# Patient Record
Sex: Female | Born: 1935 | Race: White | Hispanic: No | Marital: Married | Smoking: Never smoker
Health system: Southern US, Community
[De-identification: ages and names within clinical notes are randomized; demographics above are authoritative.]

## PROBLEM LIST (undated history)

## (undated) DIAGNOSIS — R55 Syncope and collapse: Secondary | ICD-10-CM

## (undated) DIAGNOSIS — N179 Acute kidney failure, unspecified: Secondary | ICD-10-CM

## (undated) DIAGNOSIS — M545 Low back pain: Secondary | ICD-10-CM

## (undated) DIAGNOSIS — R300 Dysuria: Secondary | ICD-10-CM

## (undated) DIAGNOSIS — K859 Acute pancreatitis without necrosis or infection, unspecified: Secondary | ICD-10-CM

## (undated) DIAGNOSIS — M79609 Pain in unspecified limb: Secondary | ICD-10-CM

## (undated) DIAGNOSIS — I10 Essential (primary) hypertension: Secondary | ICD-10-CM

## (undated) DIAGNOSIS — I951 Orthostatic hypotension: Secondary | ICD-10-CM

## (undated) DIAGNOSIS — Z87442 Personal history of urinary calculi: Secondary | ICD-10-CM

## (undated) DIAGNOSIS — E785 Hyperlipidemia, unspecified: Secondary | ICD-10-CM

## (undated) DIAGNOSIS — M48061 Spinal stenosis, lumbar region without neurogenic claudication: Secondary | ICD-10-CM

## (undated) DIAGNOSIS — C189 Malignant neoplasm of colon, unspecified: Secondary | ICD-10-CM

## (undated) DIAGNOSIS — N209 Urinary calculus, unspecified: Secondary | ICD-10-CM

## (undated) HISTORY — DX: Malignant neoplasm of colon, unspecified: C18.9

## (undated) HISTORY — DX: Syncope and collapse: R55

## (undated) HISTORY — DX: Low back pain: M54.5

## (undated) HISTORY — PX: CYSTOSCOPY: SUR368

## (undated) HISTORY — DX: Acute pancreatitis without necrosis or infection, unspecified: K85.90

## (undated) HISTORY — DX: Hyperlipidemia, unspecified: E78.5

## (undated) HISTORY — DX: Pain in unspecified limb: M79.609

## (undated) HISTORY — DX: Spinal stenosis, lumbar region without neurogenic claudication: M48.061

## (undated) HISTORY — DX: Urinary calculus, unspecified: N20.9

## (undated) HISTORY — DX: Orthostatic hypotension: I95.1

## (undated) HISTORY — DX: Dysuria: R30.0

## (undated) HISTORY — DX: Acute kidney failure, unspecified: N17.9

## (undated) HISTORY — DX: Personal history of urinary calculi: Z87.442

## (undated) HISTORY — DX: Essential (primary) hypertension: I10

---

## 1998-03-03 ENCOUNTER — Other Ambulatory Visit: Admission: RE | Admit: 1998-03-03 | Discharge: 1998-03-03 | Payer: Self-pay | Admitting: Gastroenterology

## 1998-03-23 ENCOUNTER — Encounter (HOSPITAL_BASED_OUTPATIENT_CLINIC_OR_DEPARTMENT_OTHER): Payer: Self-pay | Admitting: General Surgery

## 1998-03-27 ENCOUNTER — Inpatient Hospital Stay (HOSPITAL_COMMUNITY): Admission: RE | Admit: 1998-03-27 | Discharge: 1998-03-31 | Payer: Self-pay | Admitting: General Surgery

## 1998-04-18 ENCOUNTER — Ambulatory Visit (HOSPITAL_COMMUNITY): Admission: RE | Admit: 1998-04-18 | Discharge: 1998-04-18 | Payer: Self-pay | Admitting: Internal Medicine

## 1998-04-18 ENCOUNTER — Encounter: Payer: Self-pay | Admitting: Internal Medicine

## 1998-07-12 ENCOUNTER — Encounter: Payer: Self-pay | Admitting: Obstetrics & Gynecology

## 1998-07-12 ENCOUNTER — Ambulatory Visit (HOSPITAL_COMMUNITY): Admission: RE | Admit: 1998-07-12 | Discharge: 1998-07-12 | Payer: Self-pay | Admitting: Oral Surgery

## 1999-07-25 ENCOUNTER — Ambulatory Visit (HOSPITAL_COMMUNITY): Admission: RE | Admit: 1999-07-25 | Discharge: 1999-07-25 | Payer: Self-pay | Admitting: Internal Medicine

## 1999-07-25 ENCOUNTER — Encounter: Payer: Self-pay | Admitting: Internal Medicine

## 2000-04-15 HISTORY — PX: COLON SURGERY: SHX602

## 2000-07-28 ENCOUNTER — Encounter: Payer: Self-pay | Admitting: Internal Medicine

## 2000-07-28 ENCOUNTER — Ambulatory Visit (HOSPITAL_COMMUNITY): Admission: RE | Admit: 2000-07-28 | Discharge: 2000-07-28 | Payer: Self-pay | Admitting: Internal Medicine

## 2000-11-11 ENCOUNTER — Other Ambulatory Visit: Admission: RE | Admit: 2000-11-11 | Discharge: 2000-11-11 | Payer: Self-pay | Admitting: Obstetrics and Gynecology

## 2000-11-14 ENCOUNTER — Ambulatory Visit (HOSPITAL_COMMUNITY): Admission: RE | Admit: 2000-11-14 | Discharge: 2000-11-14 | Payer: Self-pay | Admitting: Obstetrics and Gynecology

## 2000-11-14 ENCOUNTER — Encounter: Payer: Self-pay | Admitting: Obstetrics and Gynecology

## 2001-02-16 ENCOUNTER — Encounter: Payer: Self-pay | Admitting: Obstetrics and Gynecology

## 2001-02-16 ENCOUNTER — Ambulatory Visit (HOSPITAL_COMMUNITY): Admission: RE | Admit: 2001-02-16 | Discharge: 2001-02-16 | Payer: Self-pay | Admitting: Obstetrics and Gynecology

## 2001-03-30 ENCOUNTER — Inpatient Hospital Stay (HOSPITAL_COMMUNITY): Admission: RE | Admit: 2001-03-30 | Discharge: 2001-04-02 | Payer: Self-pay | Admitting: Obstetrics and Gynecology

## 2001-03-30 ENCOUNTER — Encounter (INDEPENDENT_AMBULATORY_CARE_PROVIDER_SITE_OTHER): Payer: Self-pay

## 2001-04-12 ENCOUNTER — Encounter: Payer: Self-pay | Admitting: Obstetrics and Gynecology

## 2001-04-12 ENCOUNTER — Inpatient Hospital Stay (HOSPITAL_COMMUNITY): Admission: EM | Admit: 2001-04-12 | Discharge: 2001-05-05 | Payer: Self-pay | Admitting: Emergency Medicine

## 2001-04-12 ENCOUNTER — Encounter: Payer: Self-pay | Admitting: Surgery

## 2001-04-17 ENCOUNTER — Encounter: Payer: Self-pay | Admitting: Surgery

## 2001-04-18 ENCOUNTER — Encounter: Payer: Self-pay | Admitting: Surgery

## 2001-04-19 ENCOUNTER — Encounter: Payer: Self-pay | Admitting: Surgery

## 2001-04-20 ENCOUNTER — Encounter: Payer: Self-pay | Admitting: Surgery

## 2001-04-21 ENCOUNTER — Encounter: Payer: Self-pay | Admitting: Surgery

## 2001-04-22 ENCOUNTER — Encounter: Payer: Self-pay | Admitting: Surgery

## 2001-04-24 ENCOUNTER — Encounter: Payer: Self-pay | Admitting: Surgery

## 2001-04-27 ENCOUNTER — Encounter: Payer: Self-pay | Admitting: Surgery

## 2001-05-03 ENCOUNTER — Encounter: Payer: Self-pay | Admitting: General Surgery

## 2002-02-19 ENCOUNTER — Encounter: Payer: Self-pay | Admitting: Emergency Medicine

## 2002-02-19 ENCOUNTER — Emergency Department (HOSPITAL_COMMUNITY): Admission: EM | Admit: 2002-02-19 | Discharge: 2002-02-19 | Payer: Self-pay | Admitting: Emergency Medicine

## 2003-01-06 ENCOUNTER — Encounter: Payer: Self-pay | Admitting: Internal Medicine

## 2003-01-06 ENCOUNTER — Ambulatory Visit (HOSPITAL_COMMUNITY): Admission: RE | Admit: 2003-01-06 | Discharge: 2003-01-06 | Payer: Self-pay | Admitting: *Deleted

## 2003-07-30 ENCOUNTER — Emergency Department (HOSPITAL_COMMUNITY): Admission: EM | Admit: 2003-07-30 | Discharge: 2003-07-30 | Payer: Self-pay | Admitting: Emergency Medicine

## 2004-02-24 ENCOUNTER — Ambulatory Visit (HOSPITAL_COMMUNITY): Admission: RE | Admit: 2004-02-24 | Discharge: 2004-02-24 | Payer: Self-pay | Admitting: Internal Medicine

## 2004-04-08 ENCOUNTER — Emergency Department (HOSPITAL_COMMUNITY): Admission: EM | Admit: 2004-04-08 | Discharge: 2004-04-08 | Payer: Self-pay | Admitting: Emergency Medicine

## 2004-05-08 ENCOUNTER — Ambulatory Visit: Payer: Self-pay | Admitting: Internal Medicine

## 2004-05-23 ENCOUNTER — Ambulatory Visit: Payer: Self-pay | Admitting: Family Medicine

## 2004-11-13 ENCOUNTER — Ambulatory Visit: Payer: Self-pay | Admitting: Internal Medicine

## 2004-11-20 ENCOUNTER — Ambulatory Visit: Payer: Self-pay | Admitting: Internal Medicine

## 2005-01-18 ENCOUNTER — Ambulatory Visit: Payer: Self-pay | Admitting: Internal Medicine

## 2005-02-27 ENCOUNTER — Ambulatory Visit (HOSPITAL_COMMUNITY): Admission: RE | Admit: 2005-02-27 | Discharge: 2005-02-27 | Payer: Self-pay | Admitting: Geriatric Medicine

## 2005-10-15 ENCOUNTER — Ambulatory Visit: Payer: Self-pay | Admitting: Internal Medicine

## 2005-11-01 ENCOUNTER — Ambulatory Visit: Payer: Self-pay | Admitting: Internal Medicine

## 2006-01-03 ENCOUNTER — Ambulatory Visit: Payer: Self-pay | Admitting: Internal Medicine

## 2006-02-27 ENCOUNTER — Ambulatory Visit: Payer: Self-pay | Admitting: Internal Medicine

## 2006-03-11 ENCOUNTER — Ambulatory Visit (HOSPITAL_COMMUNITY): Admission: RE | Admit: 2006-03-11 | Discharge: 2006-03-11 | Payer: Self-pay | Admitting: Internal Medicine

## 2006-11-26 ENCOUNTER — Inpatient Hospital Stay (HOSPITAL_COMMUNITY): Admission: EM | Admit: 2006-11-26 | Discharge: 2006-11-29 | Payer: Self-pay | Admitting: Urology

## 2006-11-26 ENCOUNTER — Ambulatory Visit: Payer: Self-pay | Admitting: Internal Medicine

## 2006-11-26 ENCOUNTER — Encounter: Payer: Self-pay | Admitting: Emergency Medicine

## 2006-11-27 ENCOUNTER — Encounter (INDEPENDENT_AMBULATORY_CARE_PROVIDER_SITE_OTHER): Payer: Self-pay | Admitting: *Deleted

## 2006-12-09 ENCOUNTER — Ambulatory Visit (HOSPITAL_BASED_OUTPATIENT_CLINIC_OR_DEPARTMENT_OTHER): Admission: RE | Admit: 2006-12-09 | Discharge: 2006-12-10 | Payer: Self-pay | Admitting: Urology

## 2007-02-03 DIAGNOSIS — M48061 Spinal stenosis, lumbar region without neurogenic claudication: Secondary | ICD-10-CM

## 2007-02-03 DIAGNOSIS — C189 Malignant neoplasm of colon, unspecified: Secondary | ICD-10-CM

## 2007-02-03 DIAGNOSIS — I10 Essential (primary) hypertension: Secondary | ICD-10-CM

## 2007-02-03 HISTORY — DX: Malignant neoplasm of colon, unspecified: C18.9

## 2007-02-03 HISTORY — DX: Essential (primary) hypertension: I10

## 2007-02-03 HISTORY — DX: Spinal stenosis, lumbar region without neurogenic claudication: M48.061

## 2007-02-04 ENCOUNTER — Ambulatory Visit: Payer: Self-pay | Admitting: Internal Medicine

## 2007-02-04 DIAGNOSIS — N209 Urinary calculus, unspecified: Secondary | ICD-10-CM

## 2007-02-04 DIAGNOSIS — R300 Dysuria: Secondary | ICD-10-CM

## 2007-02-04 HISTORY — DX: Dysuria: R30.0

## 2007-02-04 HISTORY — DX: Urinary calculus, unspecified: N20.9

## 2007-02-04 LAB — CONVERTED CEMR LAB
Bilirubin Urine: NEGATIVE
Glucose, Urine, Semiquant: NEGATIVE
Ketones, urine, test strip: NEGATIVE
Nitrite: NEGATIVE
Protein, U semiquant: 30
Specific Gravity, Urine: >=1.03
Urobilinogen, UA: NEGATIVE
pH: 5

## 2007-02-05 ENCOUNTER — Encounter: Payer: Self-pay | Admitting: Internal Medicine

## 2007-02-09 ENCOUNTER — Encounter (INDEPENDENT_AMBULATORY_CARE_PROVIDER_SITE_OTHER): Payer: Self-pay | Admitting: *Deleted

## 2007-02-10 ENCOUNTER — Encounter (INDEPENDENT_AMBULATORY_CARE_PROVIDER_SITE_OTHER): Payer: Self-pay | Admitting: *Deleted

## 2007-03-13 ENCOUNTER — Ambulatory Visit: Payer: Self-pay | Admitting: Internal Medicine

## 2007-03-13 DIAGNOSIS — R55 Syncope and collapse: Secondary | ICD-10-CM | POA: Insufficient documentation

## 2007-03-13 DIAGNOSIS — M545 Low back pain, unspecified: Secondary | ICD-10-CM | POA: Insufficient documentation

## 2007-03-13 HISTORY — DX: Low back pain, unspecified: M54.50

## 2007-03-13 HISTORY — DX: Syncope and collapse: R55

## 2007-03-13 LAB — CONVERTED CEMR LAB
Bilirubin Urine: NEGATIVE
Blood in Urine, dipstick: NEGATIVE
Glucose, Urine, Semiquant: NEGATIVE
Ketones, urine, test strip: NEGATIVE
Nitrite: NEGATIVE
Protein, U semiquant: NEGATIVE
Specific Gravity, Urine: 1.02
Urobilinogen, UA: NEGATIVE
WBC Urine, dipstick: NEGATIVE
pH: 5

## 2007-03-16 ENCOUNTER — Ambulatory Visit: Payer: Self-pay | Admitting: Internal Medicine

## 2007-03-18 ENCOUNTER — Telehealth: Payer: Self-pay | Admitting: Internal Medicine

## 2007-04-24 ENCOUNTER — Ambulatory Visit (HOSPITAL_COMMUNITY): Admission: RE | Admit: 2007-04-24 | Discharge: 2007-04-24 | Payer: Self-pay | Admitting: Internal Medicine

## 2007-06-19 ENCOUNTER — Ambulatory Visit: Payer: Self-pay | Admitting: Internal Medicine

## 2007-06-19 DIAGNOSIS — Z87442 Personal history of urinary calculi: Secondary | ICD-10-CM

## 2007-06-19 DIAGNOSIS — E785 Hyperlipidemia, unspecified: Secondary | ICD-10-CM

## 2007-06-19 HISTORY — DX: Hyperlipidemia, unspecified: E78.5

## 2007-06-19 HISTORY — DX: Personal history of urinary calculi: Z87.442

## 2007-09-18 ENCOUNTER — Ambulatory Visit: Payer: Self-pay | Admitting: Internal Medicine

## 2007-09-18 LAB — CONVERTED CEMR LAB
ALT: 20 units/L (ref 0–35)
AST: 23 units/L (ref 0–37)
Albumin: 3.7 g/dL (ref 3.5–5.2)
Alkaline Phosphatase: 51 units/L (ref 39–117)
BUN: 11 mg/dL (ref 6–23)
Basophils Absolute: 0 10*3/uL (ref 0.0–0.1)
Basophils Relative: 0.7 % (ref 0.0–1.0)
Bilirubin Urine: NEGATIVE
Bilirubin, Direct: 0.1 mg/dL (ref 0.0–0.3)
CO2: 32 meq/L (ref 19–32)
Calcium: 10.3 mg/dL (ref 8.4–10.5)
Chloride: 105 meq/L (ref 96–112)
Cholesterol: 169 mg/dL (ref 0–200)
Creatinine, Ser: 0.9 mg/dL (ref 0.4–1.2)
Direct LDL: 90.2 mg/dL
Eosinophils Absolute: 0.1 10*3/uL (ref 0.0–0.7)
Eosinophils Relative: 1.8 % (ref 0.0–5.0)
GFR calc Af Amer: 77 mL/min
GFR calc non Af Amer: 63 mL/min
Glucose, Bld: 98 mg/dL (ref 70–99)
Glucose, Urine, Semiquant: NEGATIVE
HCT: 38.7 % (ref 36.0–46.0)
HDL: 52 mg/dL (ref >39.0)
Hemoglobin: 13.4 g/dL (ref 12.0–15.0)
Ketones, urine, test strip: NEGATIVE
Lymphocytes Relative: 46.5 % — ABNORMAL HIGH (ref 12.0–46.0)
MCHC: 34.6 g/dL (ref 30.0–36.0)
MCV: 93.6 fL (ref 78.0–100.0)
Monocytes Absolute: 0.5 10*3/uL (ref 0.1–1.0)
Monocytes Relative: 9 % (ref 3.0–12.0)
Neutro Abs: 2.3 10*3/uL (ref 1.4–7.7)
Neutrophils Relative %: 42 % — ABNORMAL LOW (ref 43.0–77.0)
Nitrite: POSITIVE
Platelets: 184 10*3/uL (ref 150–400)
Potassium: 4.5 meq/L (ref 3.5–5.1)
Protein, U semiquant: 100
RBC: 4.14 M/uL (ref 3.87–5.11)
RDW: 11.7 % (ref 11.5–14.6)
Sodium: 144 meq/L (ref 135–145)
Specific Gravity, Urine: >=1.03
TSH: 1.54 microintl units/mL (ref 0.35–5.50)
Total Bilirubin: 1.1 mg/dL (ref 0.3–1.2)
Total CHOL/HDL Ratio: 3.3
Total Protein: 6.7 g/dL (ref 6.0–8.3)
Triglycerides: 208 mg/dL (ref 0–149)
Urobilinogen, UA: 0.2
VLDL: 42 mg/dL — ABNORMAL HIGH (ref 0–40)
WBC: 5.5 10*3/uL (ref 4.5–10.5)
pH: 5

## 2007-10-19 ENCOUNTER — Telehealth: Payer: Self-pay | Admitting: Internal Medicine

## 2007-12-22 ENCOUNTER — Telehealth: Payer: Self-pay | Admitting: Internal Medicine

## 2007-12-24 ENCOUNTER — Ambulatory Visit: Payer: Self-pay | Admitting: Internal Medicine

## 2007-12-24 DIAGNOSIS — I951 Orthostatic hypotension: Secondary | ICD-10-CM

## 2007-12-24 HISTORY — DX: Orthostatic hypotension: I95.1

## 2007-12-27 ENCOUNTER — Encounter: Payer: Self-pay | Admitting: Internal Medicine

## 2007-12-27 ENCOUNTER — Ambulatory Visit: Payer: Self-pay | Admitting: Internal Medicine

## 2007-12-27 ENCOUNTER — Inpatient Hospital Stay (HOSPITAL_COMMUNITY): Admission: EM | Admit: 2007-12-27 | Discharge: 2008-01-03 | Payer: Self-pay | Admitting: Emergency Medicine

## 2007-12-27 DIAGNOSIS — R197 Diarrhea, unspecified: Secondary | ICD-10-CM | POA: Insufficient documentation

## 2007-12-27 DIAGNOSIS — K859 Acute pancreatitis without necrosis or infection, unspecified: Secondary | ICD-10-CM | POA: Insufficient documentation

## 2007-12-27 DIAGNOSIS — N179 Acute kidney failure, unspecified: Secondary | ICD-10-CM

## 2007-12-27 HISTORY — DX: Acute pancreatitis without necrosis or infection, unspecified: K85.90

## 2007-12-27 HISTORY — DX: Acute kidney failure, unspecified: N17.9

## 2007-12-28 ENCOUNTER — Ambulatory Visit: Payer: Self-pay | Admitting: Internal Medicine

## 2007-12-30 ENCOUNTER — Encounter: Payer: Self-pay | Admitting: Internal Medicine

## 2008-01-05 ENCOUNTER — Telehealth (INDEPENDENT_AMBULATORY_CARE_PROVIDER_SITE_OTHER): Payer: Self-pay | Admitting: *Deleted

## 2008-01-11 LAB — HM COLONOSCOPY

## 2008-01-12 ENCOUNTER — Ambulatory Visit: Payer: Self-pay | Admitting: Internal Medicine

## 2008-01-12 LAB — CONVERTED CEMR LAB
Bilirubin Urine: NEGATIVE
Glucose, Urine, Semiquant: NEGATIVE
Ketones, urine, test strip: NEGATIVE
Nitrite: NEGATIVE
Protein, U semiquant: 30
Specific Gravity, Urine: 1.015
Urobilinogen, UA: NEGATIVE
pH: 5

## 2008-03-17 ENCOUNTER — Emergency Department (HOSPITAL_COMMUNITY): Admission: EM | Admit: 2008-03-17 | Discharge: 2008-03-18 | Payer: Self-pay | Admitting: Emergency Medicine

## 2008-03-18 ENCOUNTER — Inpatient Hospital Stay (HOSPITAL_COMMUNITY): Admission: AD | Admit: 2008-03-18 | Discharge: 2008-03-21 | Payer: Self-pay | Admitting: Urology

## 2008-07-07 ENCOUNTER — Ambulatory Visit: Payer: Self-pay | Admitting: Internal Medicine

## 2008-09-13 ENCOUNTER — Ambulatory Visit: Payer: Self-pay | Admitting: Family Medicine

## 2008-10-13 ENCOUNTER — Ambulatory Visit: Payer: Self-pay | Admitting: Internal Medicine

## 2009-01-18 ENCOUNTER — Ambulatory Visit: Payer: Self-pay | Admitting: Internal Medicine

## 2009-04-18 ENCOUNTER — Ambulatory Visit: Payer: Self-pay | Admitting: Internal Medicine

## 2009-05-19 ENCOUNTER — Ambulatory Visit: Payer: Self-pay | Admitting: Internal Medicine

## 2009-05-19 LAB — CONVERTED CEMR LAB
BUN: 15 mg/dL (ref 6–23)
CO2: 27 meq/L (ref 19–32)
Calcium: 10.1 mg/dL (ref 8.4–10.5)
Chloride: 103 meq/L (ref 96–112)
Creatinine, Ser: 1.47 mg/dL — ABNORMAL HIGH (ref 0.40–1.20)
Glucose, Bld: 91 mg/dL (ref 70–99)
Potassium: 4.5 meq/L (ref 3.5–5.3)
Sodium: 142 meq/L (ref 135–145)

## 2009-06-07 ENCOUNTER — Emergency Department (HOSPITAL_COMMUNITY): Admission: EM | Admit: 2009-06-07 | Discharge: 2009-06-07 | Payer: Self-pay | Admitting: Emergency Medicine

## 2009-06-16 ENCOUNTER — Ambulatory Visit: Payer: Self-pay | Admitting: Internal Medicine

## 2009-08-30 IMAGING — CT CT PELVIS W/O CM
2 of 4 series · 17 of 46 positions shown, 19 images · non-contrast
Comparison: 11/26/2006

CT ABDOMEN

CLINICAL DATA: Tenderness around colostomy

CT ABDOMEN AND PELVIS WITHOUT CONTRAST
TECHNIQUE: Multidetector CT imaging of the abdomen and pelvis was
performed following the standard
protocol without intravenous contrast.

[Series 2: abd_ 5.0 b40f st · axial · 0.72mm/px · z∈[-458,-82]mm · 14 of 83 slices shown, 16 images]
[im 4/83  soft-tissue]
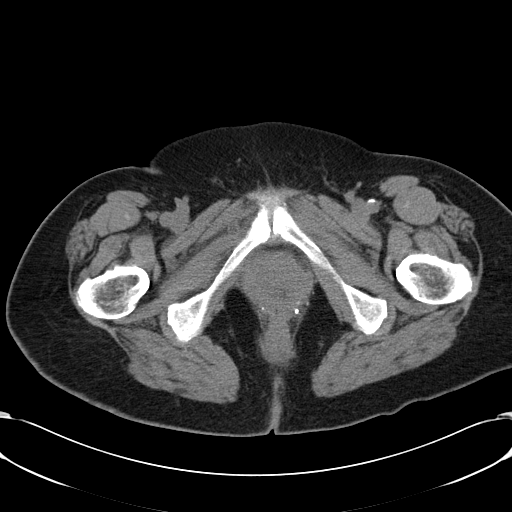
[im 4/83  bone]
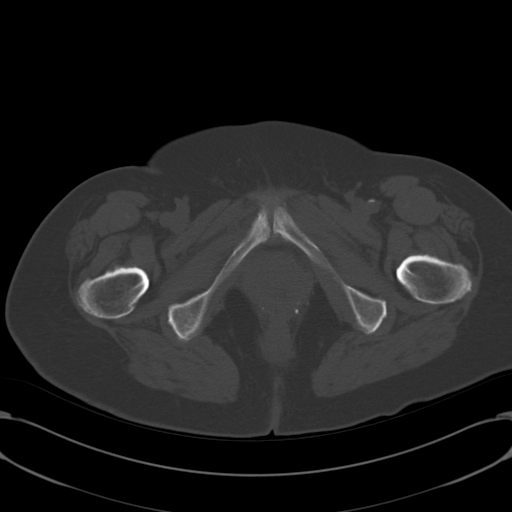
[im 10/83  soft-tissue]
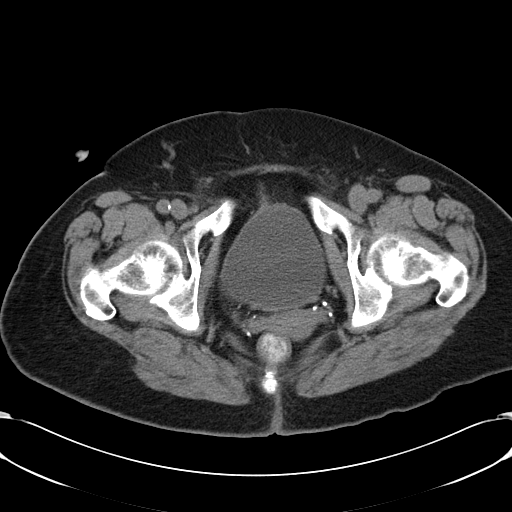
[im 17/83  soft-tissue]
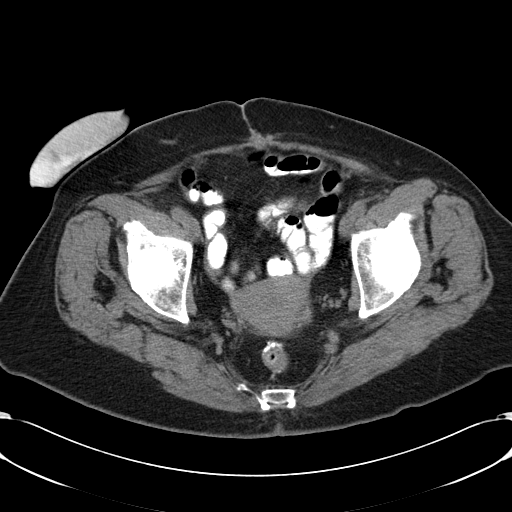
[im 23/83  soft-tissue]
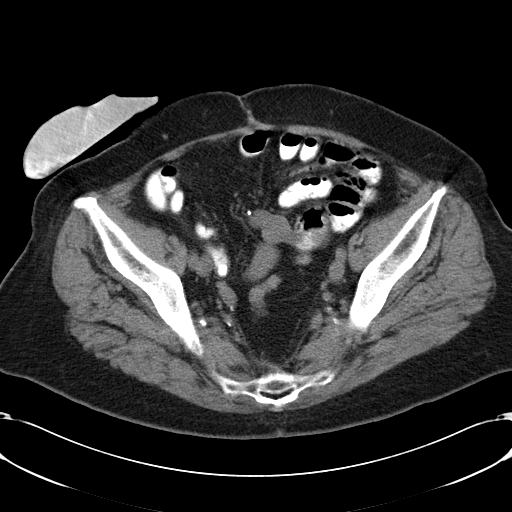
[im 27/83  soft-tissue]
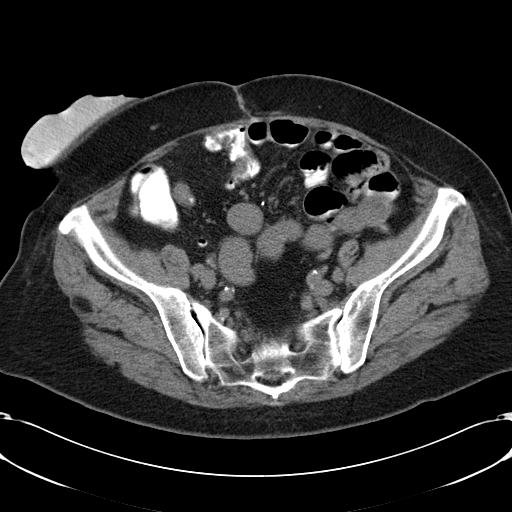
[im 33/83  soft-tissue]
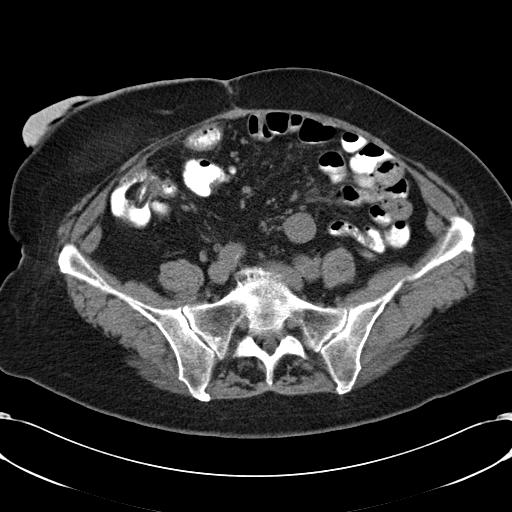
[im 40/83  soft-tissue]
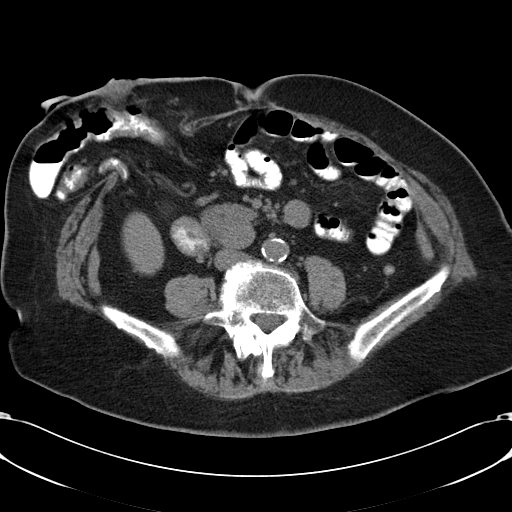
[im 43/83  soft-tissue]
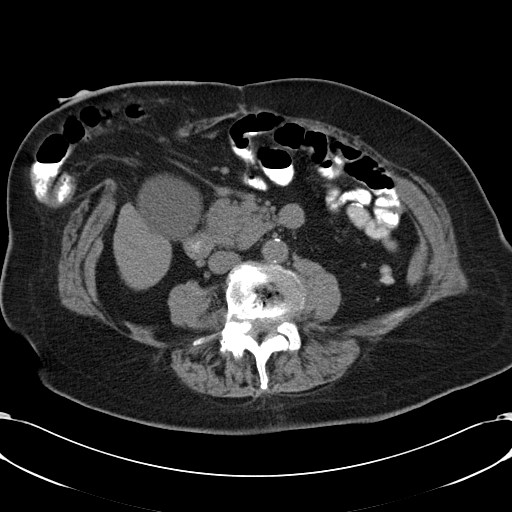
[im 50/83  soft-tissue]
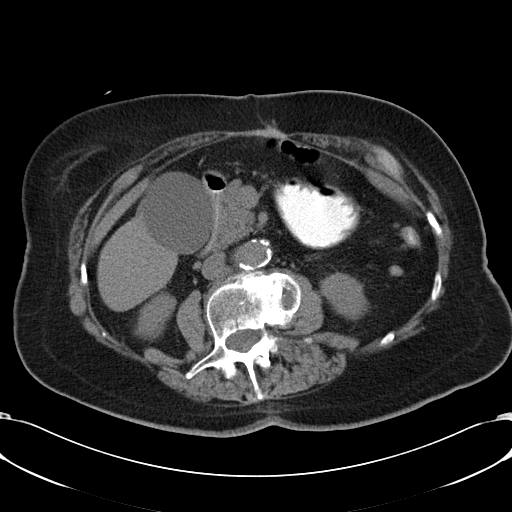
[im 50/83  bone]
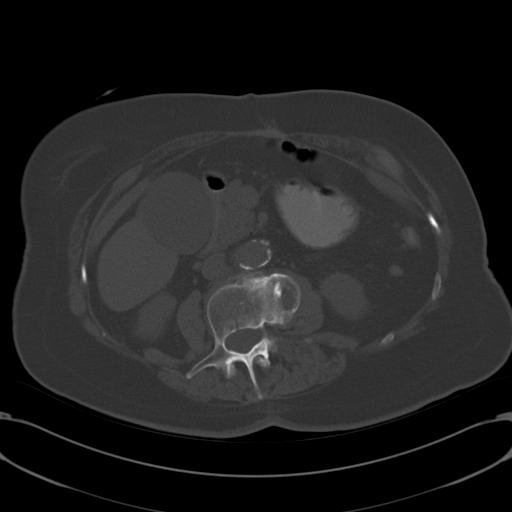
[im 56/83  soft-tissue]
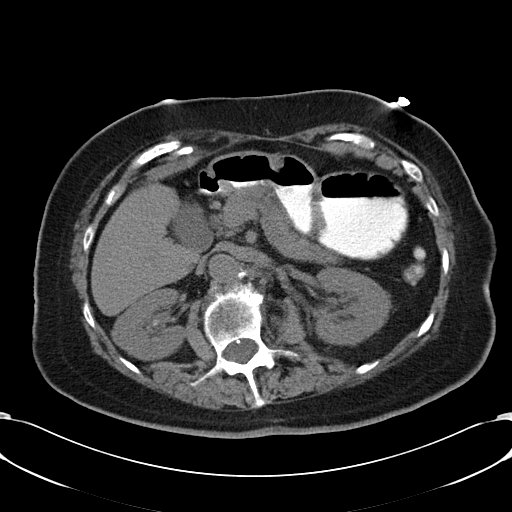
[im 63/83  soft-tissue]
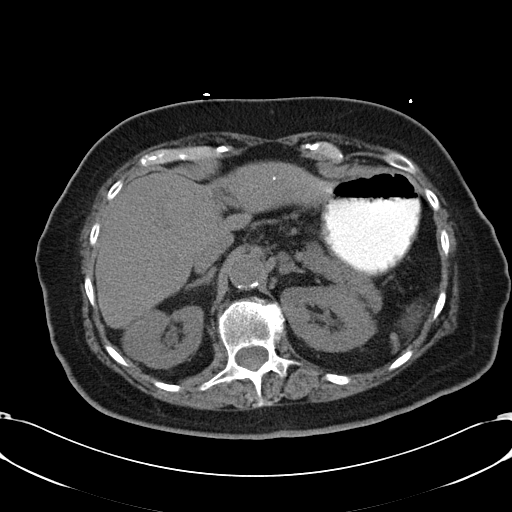
[im 66/83  soft-tissue]
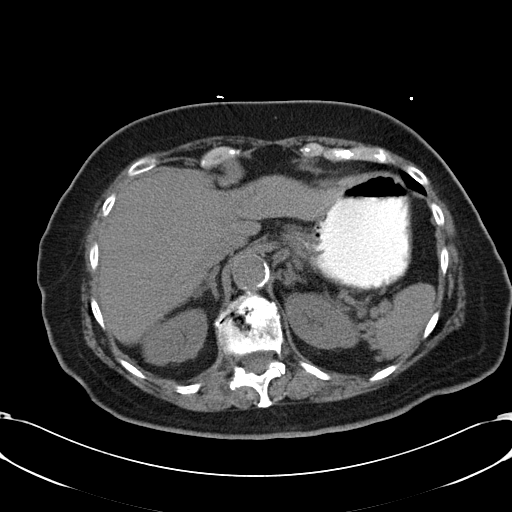
[im 73/83  soft-tissue]
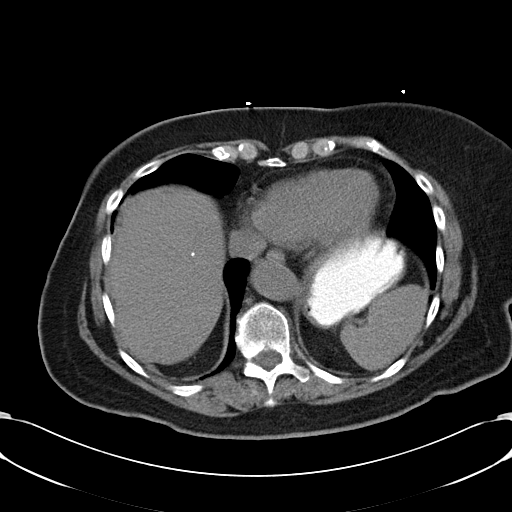
[im 79/83  soft-tissue]
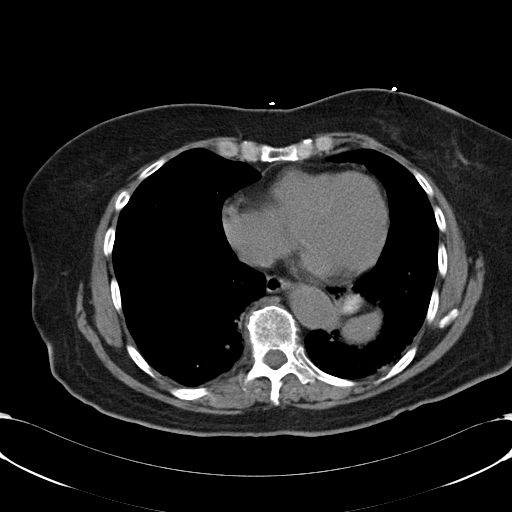

[Series 602: coronal abdomen · coronal · 0.84mm/px · 3 of 117 slices shown]
[im 39/117  soft-tissue]
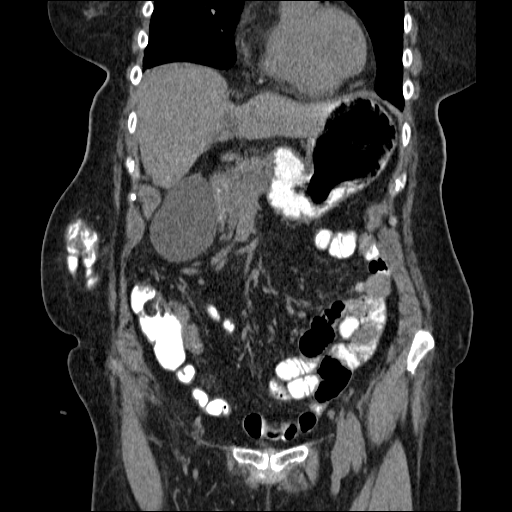
[im 52/117  soft-tissue]
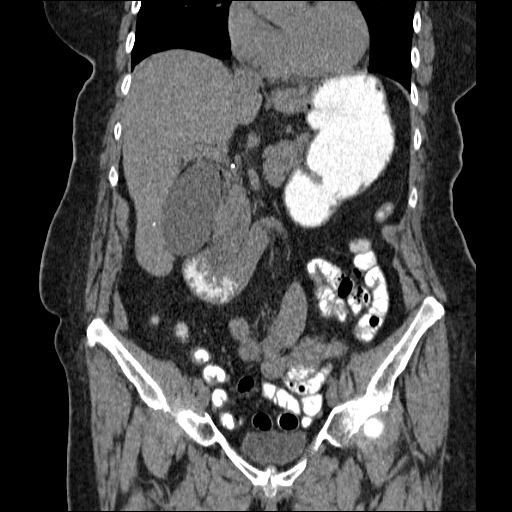
[im 65/117  soft-tissue]
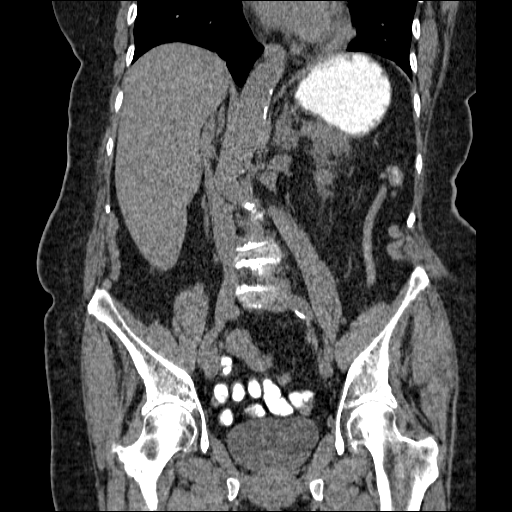

[17 of 46 positions shown; findings below may reference images not displayed]

FINDINGS: Atelectatic changes at the lung bases are noted.

Right nephrolithiasis is present.  No hydronephrosis or ureteral
calculi.  Gallstones are redemonstrated.  The liver contains a few
calcifications which is stable.  Splenic calcified granulomata are
noted.  The pancreas is unremarkable.  Negative free fluid.

The colostomy site is stable in appearance.  Transverse colon is
persistently herniated through abdominal wall defect at the hernia
site.  There is wall thickening and inflammatory change involving
the most distal aspect of the colon entering the colostomy site.
Slight inflammatory change is present in the adjacent subcutaneous
fat. No focal abscess.  No free intraperitoneal gas.  No
disproportionate dilatation of small bowel.
IMPRESSION: There is wall thickening of the distal most aspect of the colon
entering the colostomy site. Considerations would include
infectious etiology, inflammatory bowel disease, and ischemia.
Transverse colon is persistently herniated through and anterior
abdominal wall defect at the colostomy site.

Cholelithiasis and right nephrolithiasis.

CT PELVIS
FINDINGS: The uterus and adnexa are within normal limits.  The
bladder is within normal limits.  Negative free fluid.
IMPRESSION:

## 2009-08-30 IMAGING — CR DG CHEST 2V
2 series · 2 of 2 positions shown · non-contrast
Comparison: 04/08/2004

CLINICAL DATA: Weakness

CHEST - 2 VIEW

[w chest lat]
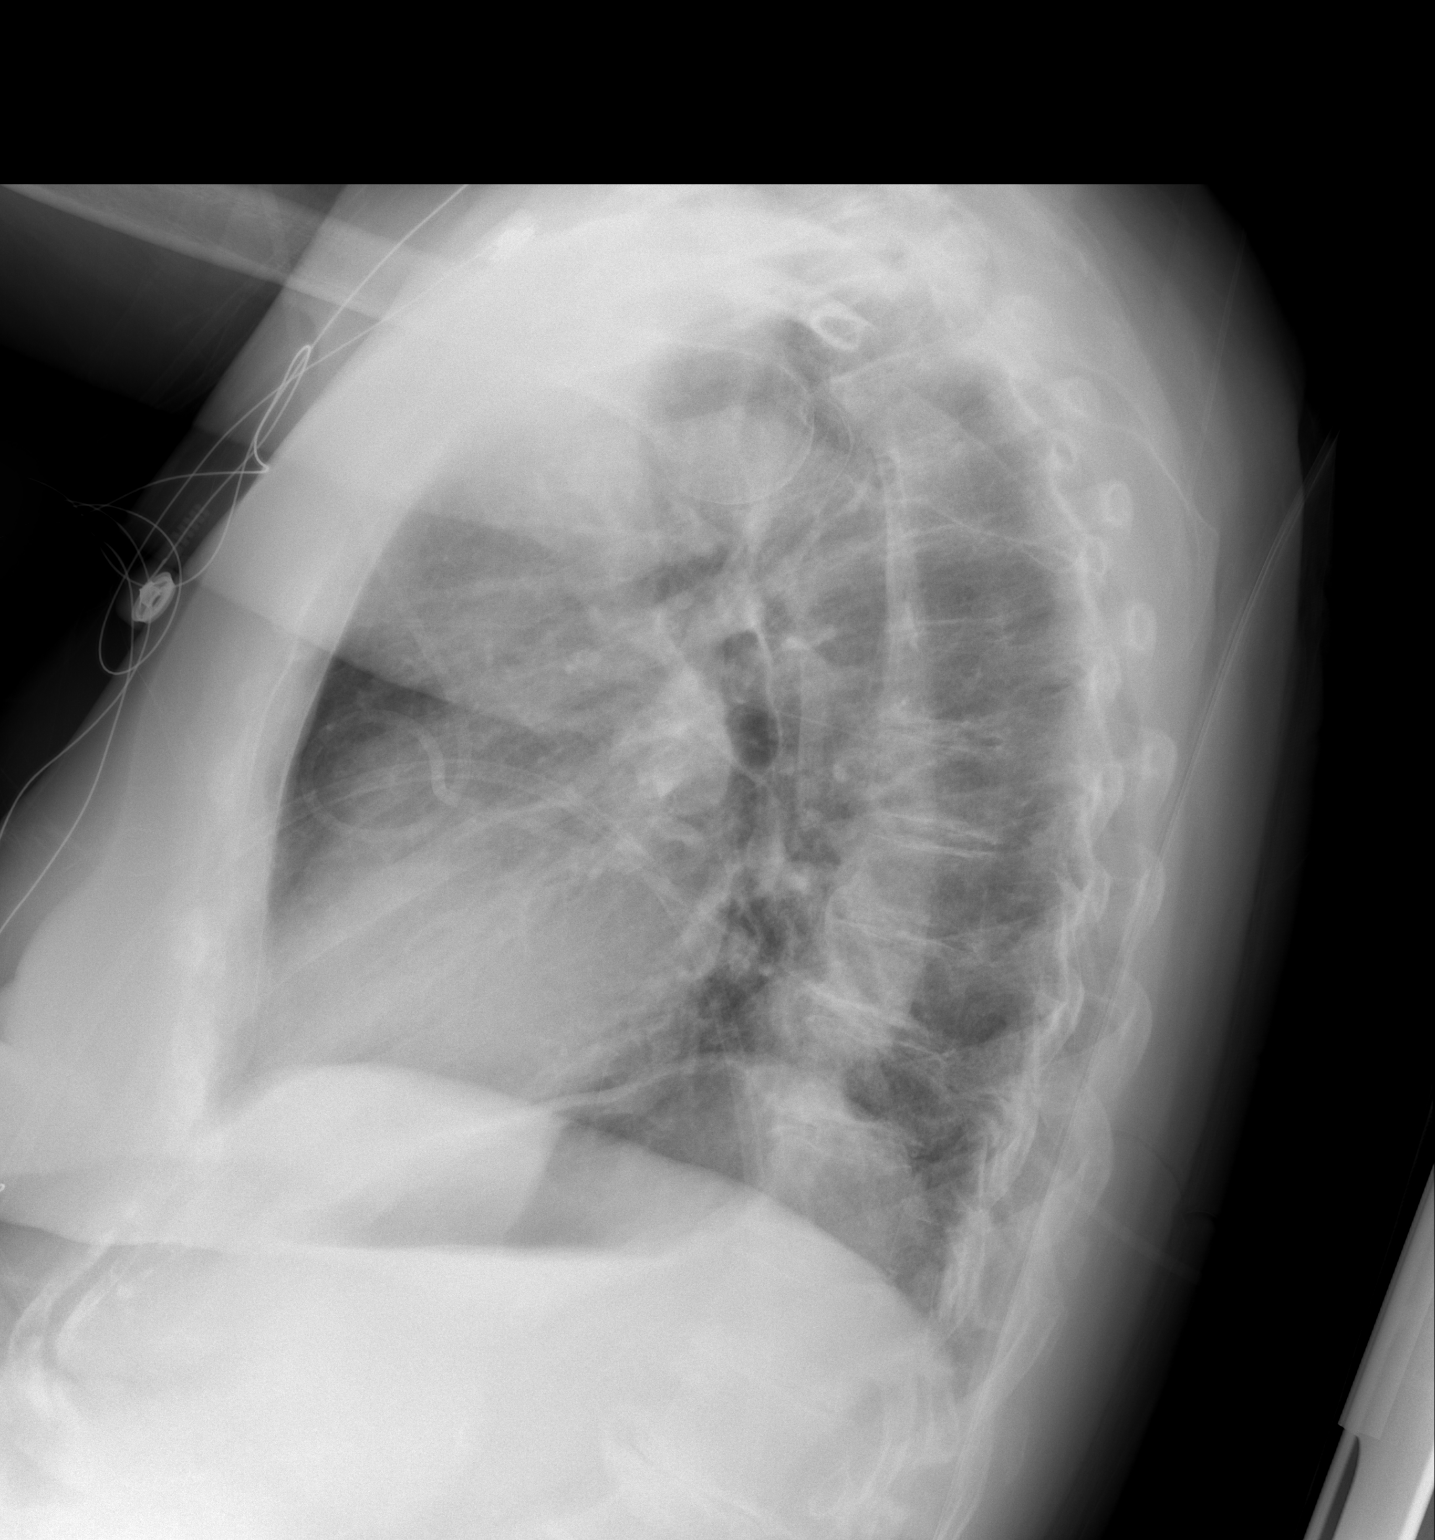

[view not recorded]
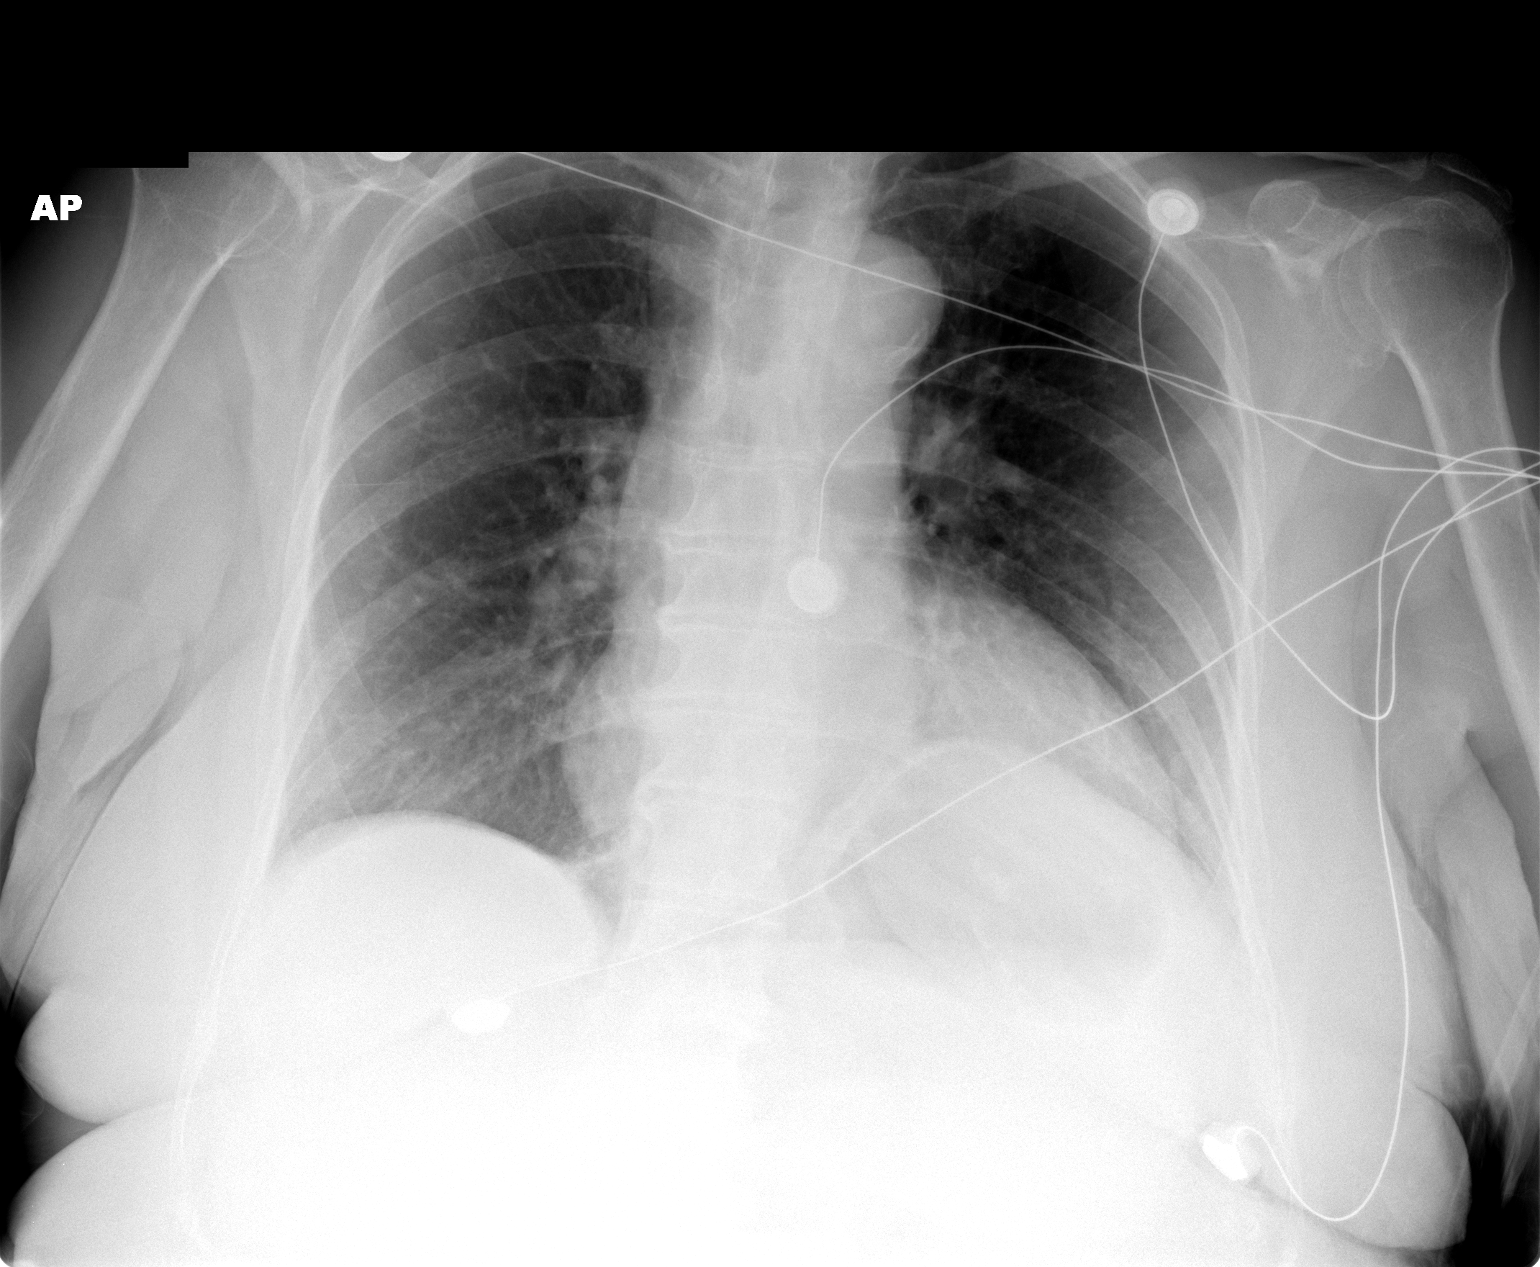

[2 of 2 positions shown; findings below may reference images not displayed]

FINDINGS: Cardiomediastinal silhouette is stable.  No acute
infiltrate or edema.  No pleural effusion.  Osteopenia and
degenerative changes thoracic spine are seen.
IMPRESSION: No active disease.  Osteopenia and degenerative changes thoracic
spine.

## 2009-09-22 ENCOUNTER — Ambulatory Visit: Payer: Self-pay | Admitting: Internal Medicine

## 2009-12-28 ENCOUNTER — Ambulatory Visit: Payer: Self-pay | Admitting: Internal Medicine

## 2010-04-20 ENCOUNTER — Other Ambulatory Visit: Payer: Self-pay | Admitting: Internal Medicine

## 2010-04-20 ENCOUNTER — Ambulatory Visit
Admission: RE | Admit: 2010-04-20 | Discharge: 2010-04-20 | Payer: Self-pay | Source: Home / Self Care | Attending: Internal Medicine | Admitting: Internal Medicine

## 2010-04-20 ENCOUNTER — Encounter: Payer: Self-pay | Admitting: Internal Medicine

## 2010-04-20 LAB — CBC WITH DIFFERENTIAL/PLATELET
Basophils Absolute: 0.1 10*3/uL (ref 0.0–0.1)
Basophils Relative: 0.9 % (ref 0.0–3.0)
Eosinophils Absolute: 0.2 10*3/uL (ref 0.0–0.7)
Eosinophils Relative: 3.2 % (ref 0.0–5.0)
HCT: 40.2 % (ref 36.0–46.0)
Hemoglobin: 13.3 g/dL (ref 12.0–15.0)
Lymphocytes Relative: 37.8 % (ref 12.0–46.0)
Lymphs Abs: 2.6 10*3/uL (ref 0.7–4.0)
MCHC: 33.2 g/dL (ref 30.0–36.0)
MCV: 90.9 fl (ref 78.0–100.0)
Monocytes Absolute: 0.5 10*3/uL (ref 0.1–1.0)
Monocytes Relative: 7.4 % (ref 3.0–12.0)
Neutro Abs: 3.5 10*3/uL (ref 1.4–7.7)
Neutrophils Relative %: 50.7 % (ref 43.0–77.0)
Platelets: 204 10*3/uL (ref 150.0–400.0)
RBC: 4.42 Mil/uL (ref 3.87–5.11)
RDW: 13.6 % (ref 11.5–14.6)
WBC: 6.8 10*3/uL (ref 4.5–10.5)

## 2010-04-20 LAB — LIPID PANEL
Cholesterol: 143 mg/dL (ref 0–200)
HDL: 50.3 mg/dL (ref >39.00)
LDL Cholesterol: 55 mg/dL (ref 0–99)
Total CHOL/HDL Ratio: 3
Triglycerides: 188 mg/dL — ABNORMAL HIGH (ref 0.0–149.0)
VLDL: 37.6 mg/dL (ref 0.0–40.0)

## 2010-04-20 LAB — HEPATIC FUNCTION PANEL
ALT: 20 U/L (ref 0–35)
AST: 22 U/L (ref 0–37)
Albumin: 3.6 g/dL (ref 3.5–5.2)
Alkaline Phosphatase: 61 U/L (ref 39–117)
Bilirubin, Direct: 0.1 mg/dL (ref 0.0–0.3)
Total Bilirubin: 0.7 mg/dL (ref 0.3–1.2)
Total Protein: 7 g/dL (ref 6.0–8.3)

## 2010-04-20 LAB — TSH: TSH: 2.35 u[IU]/mL (ref 0.35–5.50)

## 2010-04-20 LAB — BASIC METABOLIC PANEL
BUN: 16 mg/dL (ref 6–23)
CO2: 28 mEq/L (ref 19–32)
Calcium: 9.6 mg/dL (ref 8.4–10.5)
Chloride: 107 mEq/L (ref 96–112)
Creatinine, Ser: 1.5 mg/dL — ABNORMAL HIGH (ref 0.4–1.2)
GFR: 34.04 mL/min — ABNORMAL LOW (ref >60.00)
Glucose, Bld: 103 mg/dL — ABNORMAL HIGH (ref 70–99)
Potassium: 4.6 mEq/L (ref 3.5–5.1)
Sodium: 144 mEq/L (ref 135–145)

## 2010-05-06 ENCOUNTER — Encounter: Payer: Self-pay | Admitting: Family Medicine

## 2010-05-13 LAB — CONVERTED CEMR LAB
ALT: 18 units/L (ref 0–35)
AST: 24 units/L (ref 0–37)
Albumin: 3.5 g/dL (ref 3.5–5.2)
Alkaline Phosphatase: 49 units/L (ref 39–117)
BUN: 25 mg/dL — ABNORMAL HIGH (ref 6–23)
Basophils Absolute: 0 10*3/uL (ref 0.0–0.1)
Basophils Relative: 0.7 % (ref 0.0–3.0)
Bilirubin, Direct: 0 mg/dL (ref 0.0–0.3)
CO2: 28 meq/L (ref 19–32)
Calcium: 10.3 mg/dL (ref 8.4–10.5)
Chloride: 108 meq/L (ref 96–112)
Cholesterol: 169 mg/dL (ref 0–200)
Creatinine, Ser: 2 mg/dL — ABNORMAL HIGH (ref 0.4–1.2)
Direct LDL: 80.7 mg/dL
Eosinophils Absolute: 0.1 10*3/uL (ref 0.0–0.7)
Eosinophils Relative: 1.8 % (ref 0.0–5.0)
GFR calc non Af Amer: 25.04 mL/min (ref >60)
Glucose, Bld: 105 mg/dL — ABNORMAL HIGH (ref 70–99)
HCT: 36.6 % (ref 36.0–46.0)
HDL: 51.2 mg/dL (ref >39.00)
Hemoglobin: 12 g/dL (ref 12.0–15.0)
Lymphocytes Relative: 39.9 % (ref 12.0–46.0)
Lymphs Abs: 2.7 10*3/uL (ref 0.7–4.0)
MCHC: 32.7 g/dL (ref 30.0–36.0)
MCV: 95.3 fL (ref 78.0–100.0)
Monocytes Absolute: 0.5 10*3/uL (ref 0.1–1.0)
Monocytes Relative: 7.3 % (ref 3.0–12.0)
Neutro Abs: 3.5 10*3/uL (ref 1.4–7.7)
Neutrophils Relative %: 50.3 % (ref 43.0–77.0)
Platelets: 204 10*3/uL (ref 150.0–400.0)
Potassium: 4.5 meq/L (ref 3.5–5.1)
RBC: 3.84 M/uL — ABNORMAL LOW (ref 3.87–5.11)
RDW: 12.7 % (ref 11.5–14.6)
Sodium: 144 meq/L (ref 135–145)
TSH: 2 microintl units/mL (ref 0.35–5.50)
Total Bilirubin: 0.9 mg/dL (ref 0.3–1.2)
Total CHOL/HDL Ratio: 3
Total Protein: 7.2 g/dL (ref 6.0–8.3)
Triglycerides: 306 mg/dL — ABNORMAL HIGH (ref 0.0–149.0)
VLDL: 61.2 mg/dL — ABNORMAL HIGH (ref 0.0–40.0)
WBC: 6.8 10*3/uL (ref 4.5–10.5)

## 2010-05-14 ENCOUNTER — Ambulatory Visit
Admission: RE | Admit: 2010-05-14 | Discharge: 2010-05-14 | Payer: Self-pay | Source: Home / Self Care | Attending: Internal Medicine | Admitting: Internal Medicine

## 2010-05-14 ENCOUNTER — Encounter: Payer: Self-pay | Admitting: Internal Medicine

## 2010-05-14 ENCOUNTER — Ambulatory Visit: Admission: RE | Admit: 2010-05-14 | Discharge: 2010-05-14 | Payer: Self-pay | Source: Home / Self Care

## 2010-05-14 DIAGNOSIS — M79609 Pain in unspecified limb: Secondary | ICD-10-CM | POA: Insufficient documentation

## 2010-05-14 HISTORY — DX: Pain in unspecified limb: M79.609

## 2010-05-17 NOTE — Assessment & Plan Note (Signed)
Summary: pt will come in fasting/njr   Vital Signs:  Patient profile:   77 year old female Weight:      174 pounds Temp:     98.2 degrees F BP sitting:   168 / 90  (left arm) Cuff size:   regular  Vitals Entered By: Raechel Ache, RN (April 18, 2009 9:29 AM) CC: OV, fasting.   Primary Care Provider:  Lesia Hausen  CC:  OV and fasting.Marland Kitchen  History of Present Illness: -year-old patient who is seen today for a comprehensive evaluation.  She has a history of colon cancer, status post partial colectomy with colostomy in 2002.  She has a history of nephrolithiasis, which has been stable.  She has hypertension.  History lumbar spinal stenosis.  She has treated dyslipidemia.  She denies any cardiopulmonary complaints.  She continues to live independently and does quite well.  Allergies: 1)  ! Augmentin  Past History:  Past Medical History: Hypertension Hyperlipidemia Low back pain; history of spinal stenosis Nephrolithiasis, hx of remote colon carcinoma, status post colectomy,  2002  with chronic colostomy history of acute renal failure, secondary to volume depletion, September 2009 suspected ischemic colitis, September 2009  SOCIAL HISTORY:  No tobacco, no alcohol.  Widowed in 2001.  Currently   lives alone since 2001.  Currently lives alone and independent, no cane   or walker use.  She is here with her daughter who is quite supportive.      FAMILY HISTORY/REVIEW OF SYSTEMS:  Otherwise noncontributory.   AST MEDICAL HISTORY:  Illnesses:   1. History of abdominal abscesses, December 2002 with exploratory lap       and adhesiolysis with respiratory arrest and vent episode,       approximately 1 month after tubal and surgery for ovarian mass.   2. Hypertension.   3. History left hip DJD.   4. History of colon cancer in 1998 status post resection, unknown       stage.   5. History of SBO with resection in 1972.   6. History of postoperative anemia, now resolved.   7. History  of cystocele as above.   8. Status post T&A.      ALLERGIES:  NO KNOWN DRUG ALLERGIES.      Past Surgical History: total colectomy 2002 gravida 4, para 4, abortus zero  ATE OF PROCEDURE:  12/09/2006   DATE OF DISCHARGE:  12/10/2006                                  OPERATIVE REPORT      PROCEDURE:  Cystoscopy, right ureteroscopy with holmium lithotripsy,   left ureteroscopy with stone manipulation, insertion of bilateral double-   J stents.      PREOPERATIVE DIAGNOSIS:  Bilateral ureteral calculi.   Family History: Reviewed history from 06/19/2007 and no changes required. father died age 18 complications of leukemia mother died in her late 44s with a history of nonalcoholic cirrhosis two brothers, elderly, in reasonably good health two older sisters died in their late 31s; the patient had 38 is the youngest of 5 siblings  Social History: Reviewed history from 06/19/2007 and no changes required. patient is a widow for approximately  7 years; she has 4 children in the area; she lives independently in a single story private home; she continues to drive and is quite independent  Review of Systems  The patient denies anorexia, fever,  weight loss, weight gain, vision loss, decreased hearing, hoarseness, chest pain, syncope, dyspnea on exertion, peripheral edema, prolonged cough, headaches, hemoptysis, abdominal pain, melena, hematochezia, severe indigestion/heartburn, hematuria, incontinence, genital sores, muscle weakness, suspicious skin lesions, transient blindness, difficulty walking, depression, unusual weight change, abnormal bleeding, enlarged lymph nodes, angioedema, and breast masses.    Physical Exam  General:  overweight-appearing.  130/72.   Head:  Normocephalic and atraumatic without obvious abnormalities. No apparent alopecia or balding. Eyes:  No corneal or conjunctival inflammation noted. EOMI. Perrla. Funduscopic exam benign, without hemorrhages, exudates or  papilledema. Vision grossly normal. Ears:  External ear exam shows no significant lesions or deformities.  Otoscopic examination reveals clear canals, tympanic membranes are intact bilaterally without bulging, retraction, inflammation or discharge. Hearing is grossly normal bilaterally. Nose:  External nasal examination shows no deformity or inflammation. Nasal mucosa are pink and moist without lesions or exudates. Mouth:  Oral mucosa and oropharynx without lesions or exudates.  dentures in place Neck:  No deformities, masses, or tenderness noted. Chest Wall:  No deformities, masses, or tenderness noted. Breasts:  No mass, nodules, thickening, tenderness, bulging, retraction, inflamation, nipple discharge or skin changes noted.   Lungs:  Normal respiratory effort, chest expands symmetrically. Lungs are clear to auscultation, no crackles or wheezes. Heart:  Normal rate and regular rhythm. S1 and S2 normal without gallop, murmur, click, rub or other extra sounds. Abdomen:  surgical scars; colostomy present in the right lower quadrant Rectal:  surgically closed Genitalia:  Normal introitus for age, no external lesions, no vaginal discharge, mucosa pink and moist, no vaginal or cervical lesions, no vaginal atrophy, no friaility or hemorrhage, normal uterus size and position, no adnexal masses or tenderness Msk:  No deformity or scoliosis noted of thoracic or lumbar spine.   Pulses:  R and L carotid,radial,femoral,dorsalis pedis and posterior tibial pulses are full and equal bilaterally Extremities:  No clubbing, cyanosis, edema, or deformity noted with normal full range of motion of all joints.   Neurologic:  alert & oriented X3, cranial nerves II-XII intact, and gait normal.  the right patella reflex increase compared to the left   Impression & Recommendations:  Problem # 1:  NEPHROLITHIASIS, HX OF (ICD-V13.01)  Orders: Venipuncture (16109) TLB-Lipid Panel (80061-LIPID) TLB-BMP (Basic Metabolic  Panel-BMET) (80048-METABOL) TLB-CBC Platelet - w/Differential (85025-CBCD) TLB-Hepatic/Liver Function Pnl (80076-HEPATIC) TLB-TSH (Thyroid Stimulating Hormone) (60454-UJW) Prescription Created Electronically (J1914)  Problem # 2:  LOW BACK PAIN (ICD-724.2)  Her updated medication list for this problem includes:    Tramadol Hcl 50 Mg Tabs (Tramadol hcl) ..... One every 6 hours as needed for pain  Her updated medication list for this problem includes:    Tramadol Hcl 50 Mg Tabs (Tramadol hcl) ..... One every 6 hours as needed for pain  Problem # 3:  HYPERLIPIDEMIA (ICD-272.4)  Her updated medication list for this problem includes:    Simvastatin 20 Mg Tabs (Simvastatin) ..... One daily  Her updated medication list for this problem includes:    Simvastatin 20 Mg Tabs (Simvastatin) ..... One daily  Problem # 4:  HYPERTENSION, ESSENTIAL NOS (ICD-401.9)  Her updated medication list for this problem includes:    Metoprolol Tartrate 50 Mg Tabs (Metoprolol tartrate) .Marland Kitchen... 1/2 tab two times a day    Lisinopril-hydrochlorothiazide 20-12.5 Mg Tabs (Lisinopril-hydrochlorothiazide) ..... One-half  daily  Orders: EKG w/ Interpretation (93000) TLB-TSH (Thyroid Stimulating Hormone) (84443-TSH)  Complete Medication List: 1)  Metoprolol Tartrate 50 Mg Tabs (Metoprolol tartrate) .... 1/2 tab two times a  day 2)  Flaxseed Oil  3)  Calcium 600mg  With Vit D  4)  Fish Oil 1200mg   5)  Multivitamin  6)  Simvastatin 20 Mg Tabs (Simvastatin) .... One daily 7)  Tramadol Hcl 50 Mg Tabs (Tramadol hcl) .... One every 6 hours as needed for pain 8)  Lisinopril-hydrochlorothiazide 20-12.5 Mg Tabs (Lisinopril-hydrochlorothiazide) .... One-half  daily  Other Orders: Pelvic & Breast Exam ( Medicare)  (Z6109)  Patient Instructions: 1)  Please schedule a follow-up appointment in 6 months. 2)  Limit your Sodium (Salt). 3)  It is important that you exercise regularly at least 20 minutes 5 times a week. If you  develop chest pain, have severe difficulty breathing, or feel very tired , stop exercising immediately and seek medical attention. 4)  Check your Blood Pressure regularly. If it is above: 150/90 you should make an appointment. Prescriptions: LISINOPRIL-HYDROCHLOROTHIAZIDE 20-12.5 MG TABS (LISINOPRIL-HYDROCHLOROTHIAZIDE) one-half  daily  #90 x 6   Entered and Authorized by:   Gordy Savers  MD   Signed by:   Gordy Savers  MD on 04/18/2009   Method used:   Print then Give to Patient   RxID:   [id number] TRAMADOL HCL 50 MG TABS (TRAMADOL HCL) one every 6 hours as needed for pain  #90 x 6   Entered and Authorized by:   Gordy Savers  MD   Signed by:   Gordy Savers  MD on 04/18/2009   Method used:   Print then Give to Patient   RxID:   [id number] SIMVASTATIN 20 MG  TABS (SIMVASTATIN) one daily  #90 x 6   Entered and Authorized by:   Gordy Savers  MD   Signed by:   Gordy Savers  MD on 04/18/2009   Method used:   Print then Give to Patient   RxID:   [id number] METOPROLOL TARTRATE 50 MG  TABS (METOPROLOL TARTRATE) 1/2 tab two times a day  #180 x 4   Entered and Authorized by:   Gordy Savers  MD   Signed by:   Gordy Savers  MD on 04/18/2009   Method used:   Print then Give to Patient   RxID:   [id number] LISINOPRIL-HYDROCHLOROTHIAZIDE 20-12.5 MG TABS (LISINOPRIL-HYDROCHLOROTHIAZIDE) one-half  daily  #90 x 6   Entered and Authorized by:   Gordy Savers  MD   Signed by:   Gordy Savers  MD on 04/18/2009   Method used:   Electronically to        [facility]  [phone]* (retail)       [address]       [place], [place]  [address]       Ph: [phone] or [phone]       Fax: [phone]   RxID:   [id number] TRAMADOL HCL 50 MG TABS (TRAMADOL HCL) one every 6 hours as needed for pain  #90 x 6   Entered and Authorized by:   Gordy Savers  MD   Signed by:    Gordy Savers  MD on 04/18/2009   Method used:   Electronically to        [facility]  [phone]* (retail)       [address]       [place], [place]  [address]       Ph: [phone] or [phone]  Fax: [phone]   RxID:   [id number] SIMVASTATIN 20 MG  TABS (SIMVASTATIN) one daily  #90 x 6   Entered and Authorized by:   Gordy Savers  MD   Signed by:   Gordy Savers  MD on 04/18/2009   Method used:   Electronically to        [facility]  [phone]* (retail)       [address]       [place], [place]  [address]       Ph: [phone] or [phone]       Fax: [phone]   RxID:   [id number] METOPROLOL TARTRATE 50 MG  TABS (METOPROLOL TARTRATE) 1/2 tab two times a day  #180 x 4   Entered and Authorized by:   Gordy Savers  MD   Signed by:   Gordy Savers  MD on 04/18/2009   Method used:   Electronically to        [facility]  [phone]* (retail)       [address]       [place], [place]  [address]       Ph: [phone] or [phone]       Fax: [phone]   RxID:   [id number]  And a    Will  i ad h a

## 2010-05-17 NOTE — Assessment & Plan Note (Signed)
Summary: 1 MTH ROV // RS pt rsc/njr   Vital Signs:  Patient profile:   77 year old female Weight:      176 pounds Temp:     98.9 degrees F oral BP sitting:   98 / 58  (left arm) Cuff size:   regular  Vitals Entered By: Duard Brady LPN (June 16, 1608 10:23 AM) CC: ROV / f/u on BP   Primary Care Provider:  Lesia Hausen  CC:  ROV / f/u on BP.  History of Present Illness: 77 year old patient who is seen today for follow-up of her hypertension.  Due to worsening azotemia.  Her blood pressure medication was changed to metoprolol 25 mg b.i.d. and amlodipine.  She is doing quite well on this dose and has no concerns or complaints.  She has been monitored.  Her blood pressure at home with nice readings.  follow-up  renal studies last month were improving  Allergies: 1)  ! Augmentin  Past History:  Past Medical History: Reviewed history from 04/18/2009 and no changes required. Hypertension Hyperlipidemia Low back pain; history of spinal stenosis Nephrolithiasis, hx of remote colon carcinoma, status post colectomy,  2002  with chronic colostomy history of acute renal failure, secondary to volume depletion, September 2009 suspected ischemic colitis, September 2009  SOCIAL HISTORY:  No tobacco, no alcohol.  Widowed in 2001.  Currently   lives alone since 2001.  Currently lives alone and independent, no cane   or walker use.  She is here with her daughter who is quite supportive.      FAMILY HISTORY/REVIEW OF SYSTEMS:  Otherwise noncontributory.   AST MEDICAL HISTORY:  Illnesses:   1. History of abdominal abscesses, December 2002 with exploratory lap       and adhesiolysis with respiratory arrest and vent episode,       approximately 1 month after tubal and surgery for ovarian mass.   2. Hypertension.   3. History left hip DJD.   4. History of colon cancer in 1998 status post resection, unknown       stage.   5. History of SBO with resection in 1972.   6. History of  postoperative anemia, now resolved.   7. History of cystocele as above.   8. Status post T&A.      ALLERGIES:  NO KNOWN DRUG ALLERGIES.      Review of Systems  The patient denies anorexia, fever, weight loss, weight gain, vision loss, decreased hearing, hoarseness, chest pain, syncope, dyspnea on exertion, peripheral edema, prolonged cough, headaches, hemoptysis, abdominal pain, melena, hematochezia, severe indigestion/heartburn, hematuria, incontinence, genital sores, muscle weakness, suspicious skin lesions, transient blindness, difficulty walking, depression, unusual weight change, abnormal bleeding, enlarged lymph nodes, angioedema, and breast masses.    Physical Exam  General:  elderly alert, no distress.  Blood pressure 130/64 Lungs:  Normal respiratory effort, chest expands symmetrically. Lungs are clear to auscultation, no crackles or wheezes. Heart:  Normal rate and regular rhythm. S1 and S2 normal without gallop, murmur, click, rub or other extra sounds. Extremities:  no edema   Impression & Recommendations:  Problem # 1:  HYPERLIPIDEMIA (ICD-272.4)  Her updated medication list for this problem includes:    Simvastatin 20 Mg Tabs (Simvastatin) ..... One daily  Her updated medication list for this problem includes:    Simvastatin 20 Mg Tabs (Simvastatin) ..... One daily  Problem # 2:  HYPERTENSION, ESSENTIAL NOS (ICD-401.9)  Her updated medication list for this problem includes:    Metoprolol  Tartrate 50 Mg Tabs (Metoprolol tartrate) .Marland Kitchen... 1/2 tab two times a day    Amlodipine Besylate 5 Mg Tabs (Amlodipine besylate) ..... One daily  Her updated medication list for this problem includes:    Metoprolol Tartrate 50 Mg Tabs (Metoprolol tartrate) .Marland Kitchen... 1/2 tab two times a day    Amlodipine Besylate 5 Mg Tabs (Amlodipine besylate) ..... One daily  Complete Medication List: 1)  Metoprolol Tartrate 50 Mg Tabs (Metoprolol tartrate) .... 1/2 tab two times a day 2)  Flaxseed  Oil  3)  Calcium 600mg  With Vit D  4)  Fish Oil 1200mg   5)  Multivitamin  6)  Simvastatin 20 Mg Tabs (Simvastatin) .... One daily 7)  Tramadol Hcl 50 Mg Tabs (Tramadol hcl) .... One every 6 hours as needed for pain 8)  Amlodipine Besylate 5 Mg Tabs (Amlodipine besylate) .... One daily  Patient Instructions: 1)  Limit your Sodium (Salt) to less than 2 grams a day(slightly less than 1/2 a teaspoon) to prevent fluid retention, swelling, or worsening of symptoms. 2)  It is important that you exercise regularly at least 20 minutes 5 times a week. If you develop chest pain, have severe difficulty breathing, or feel very tired , stop exercising immediately and seek medical attention. 3)  Check your Blood Pressure regularly. If it is above: 150/90  you should make an appointment. 4)  Please schedule a follow-up appointment in 3 months.

## 2010-05-17 NOTE — Assessment & Plan Note (Signed)
Summary: flu shot//ccm   Nurse Visit   Review of Systems       Flu Vaccine Consent Questions     Do you have a history of severe allergic reactions to this vaccine? no    Any prior history of allergic reactions to egg and/or gelatin? no    Do you have a sensitivity to the preservative Thimersol? no    Do you have a past history of Guillan-Barre Syndrome? no    Do you currently have an acute febrile illness? no    Have you ever had a severe reaction to latex? no    Vaccine information given and explained to patient? yes    Are you currently pregnant? no    Lot Number:[id number]   Exp Date:10/13/2010   Site Given  Left Deltoid IM Josph Macho RMA  December 28, 2009 2:01 PM    Allergies: 1)  ! Augmentin  Orders Added: 1)  Flu Vaccine 46yrs + MEDICARE PATIENTS [Q2039] 2)  Administration Flu vaccine - MCR [G0008]

## 2010-05-17 NOTE — Assessment & Plan Note (Signed)
Summary: 3 month rov/njr   Vital Signs:  [name] profile:   77 year old female Weight:      175 pounds Temp:     98.1 degrees F oral BP sitting:   110 / 70  (right arm) Cuff size:   regular  Vitals Entered By: Duard Brady LPN (September 22, 2009 10:15 AM) CC: 3 mos rov - doing well Is [name] Diabetic? No   Primary Care Provider:  Lesia Hausen  CC:  3 mos rov - doing well.  History of Present Illness: [name] who is seen today for follow-up of her hypertension.  She has a history of osteoarthritis, renal insufficiency, which has resolved.  Diuretic therapy has been discontinued, and she has been on low-dose metoprolol and amlodipine.  Today she is doing remarkably well.  She has dyslipidemia, controlled on simvastatin 20 mg daily  Preventive Screening-Counseling & Management  Alcohol-Tobacco     Smoking Status: never  Allergies: 1)  ! Augmentin  Past History:  Past Medical History: Reviewed history from 04/18/2009 and no changes required. Hypertension Hyperlipidemia Low back pain; history of spinal stenosis Nephrolithiasis, hx of remote colon carcinoma, status post colectomy,  2002  with chronic colostomy history of acute renal failure, secondary to volume depletion, September 2009 suspected ischemic colitis, September 2009  SOCIAL HISTORY:  No tobacco, no alcohol.  Widowed in 2001.  Currently   lives alone since 2001.  Currently lives alone and independent, no cane   or walker use.  She is here with her daughter who is quite supportive.      FAMILY HISTORY/REVIEW OF SYSTEMS:  Otherwise noncontributory.   AST MEDICAL HISTORY:  Illnesses:   1. History of abdominal abscesses, December 2002 with exploratory lap       and adhesiolysis with respiratory arrest and vent episode,       approximately 1 month after tubal and surgery for ovarian mass.   2. Hypertension.   3. History left hip DJD.   4. History of colon cancer in 1998 status post resection,  unknown       stage.   5. History of SBO with resection in 1972.   6. History of postoperative anemia, now resolved.   7. History of cystocele as above.   8. Status post T&A.      ALLERGIES:  NO KNOWN DRUG ALLERGIES.      Review of Systems  The [name] denies anorexia, fever, weight loss, weight gain, vision loss, decreased hearing, hoarseness, chest pain, syncope, dyspnea on exertion, peripheral edema, prolonged cough, headaches, hemoptysis, abdominal pain, melena, hematochezia, severe indigestion/heartburn, hematuria, incontinence, genital sores, muscle weakness, suspicious skin lesions, transient blindness, difficulty walking, depression, unusual weight change, abnormal bleeding, enlarged lymph nodes, angioedema, and breast masses.    Physical Exam  General:  elderly alert, pleasant, no acute distress.  Blood pressure 110/70 Head:  Normocephalic and atraumatic without obvious abnormalities. No apparent alopecia or balding. Mouth:  Oral mucosa and oropharynx without lesions or exudates.  edentulous Neck:  No deformities, masses, or tenderness noted. Lungs:  Normal respiratory effort, chest expands symmetrically. Lungs are clear to auscultation, no crackles or wheezes. Heart:  Normal rate and regular rhythm. S1 and S2 normal without gallop, murmur, click, rub or other extra sounds. Abdomen:  Bowel sounds positive,abdomen soft and non-tender without masses, organomegaly or hernias noted. Msk:  No deformity or scoliosis noted of thoracic or lumbar spine.     Impression & Recommendations:  Problem # 1:  HYPERTENSION, ESSENTIAL NOS (  ICD-401.9)  Her updated medication list for this problem includes:    Metoprolol Tartrate 50 Mg Tabs (Metoprolol tartrate) .Marland Kitchen... 1/2 tab two times a day    Amlodipine Besylate 5 Mg Tabs (Amlodipine besylate) ..... One daily    Her updated medication list for this problem includes:    Metoprolol Tartrate 50 Mg Tabs (Metoprolol tartrate) .Marland Kitchen... 1/2 tab two  times a day    Amlodipine Besylate 5 Mg Tabs (Amlodipine besylate) ..... One daily  Problem # 2:  HYPERLIPIDEMIA (ICD-272.4)  Her updated medication list for this problem includes:    Simvastatin 20 Mg Tabs (Simvastatin) ..... One daily    Her updated medication list for this problem includes:    Simvastatin 20 Mg Tabs (Simvastatin) ..... One daily  Complete Medication List: 1)  Metoprolol Tartrate 50 Mg Tabs (Metoprolol tartrate) .... 1/2 tab two times a day 2)  Flaxseed Oil  3)  Calcium 600mg  With Vit D  4)  Fish Oil 1200mg   5)  Multivitamin  6)  Simvastatin 20 Mg Tabs (Simvastatin) .... One daily 7)  Tramadol Hcl 50 Mg Tabs (Tramadol hcl) .... One every 6 hours as needed for pain 8)  Amlodipine Besylate 5 Mg Tabs (Amlodipine besylate) .... One daily  Other Orders: Prescription Created Electronically [id number])  [name] Instructions: 1)  Please schedule a follow-up appointment in 6 months CPX  2)  Advised not to eat any food or drink any liquids after 10 PM the night before your procedure. 3)  Limit your Sodium (Salt) to less than 2 grams a day(slightly less than 1/2 a teaspoon) to prevent fluid retention, swelling, or worsening of symptoms. 4)  It is important that you exercise regularly at least 20 minutes 5 times a week. If you develop chest pain, have severe difficulty breathing, or feel very tired , stop exercising immediately and seek medical attention. Prescriptions: AMLODIPINE BESYLATE 5 MG TABS (AMLODIPINE BESYLATE) one daily  #90 x 3   Entered and Authorized by:   Gordy Savers  MD   Signed by:   Gordy Savers  MD on 09/22/2009   Method used:   Electronically to        [facility]  [phone]* (retail)       [address]       [place], [place]  [address]       Ph: [phone] or [phone]       Fax: [phone]   RxID:   [id number] TRAMADOL HCL 50 MG TABS (TRAMADOL HCL) one every 6 hours as needed for pain  #90  x 6   Entered and Authorized by:   Gordy Savers  MD   Signed by:   Gordy Savers  MD on 09/22/2009   Method used:   Electronically to        [facility]  [phone]* (retail)       [address]       [place], [place]  [address]       Ph: [phone] or [phone]       Fax: [phone]   RxID:   [id number] SIMVASTATIN 20 MG  TABS (SIMVASTATIN) one daily  #90 x 6   Entered and Authorized by:   Gordy Savers  MD   Signed by:   Gordy Savers  MD on 09/22/2009   Method used:   Electronically to  [facility]  [address]  [phone]* (retail)       [address]       [place], [place]  [address]       Ph: [phone] or [phone]       Fax: [phone]   RxID:   [id number] METOPROLOL TARTRATE 50 MG  TABS (METOPROLOL TARTRATE) 1/2 tab two times a day  #180 x 4   Entered and Authorized by:   Gordy Savers  MD   Signed by:   Gordy Savers  MD on 09/22/2009   Method used:   Electronically to        [facility]  [phone]* (retail)       [address]       [place], [place]  [address]       Ph: [phone] or [phone]       Fax: [phone]   RxID:   [id number]

## 2010-05-17 NOTE — Assessment & Plan Note (Signed)
Summary: elevated BP/dm   Vital Signs:  Patient profile:   77 year old female Weight:      181 pounds Temp:     98.6 degrees F oral BP sitting:   170 / 100  (left arm) Cuff size:   regular  Vitals Entered By: Raechel Ache, RN (May 19, 2009 3:53 PM) CC: elevated bp with home machine -    Primary Care Provider:  Lesia Hausen  CC:  elevated bp with home machine - .  History of Present Illness: an 77 year old patient who is seen today for follow-up of her hypertension.  One month ago, lisinopril, hydrochlorothiazide, was held due to worsening azotemia, and a creatinine at 2.0;  over the past show a days.  She has had worsening blood pressure readings especially systolic.  She feels well today denies any cardiopulmonary complaints.  Blood pressures were checked with her home monitor and revealed good correlation.  Blood pressures were in the 180/80 range.  Allergies: 1)  ! Augmentin  Past History:  Past Medical History: Reviewed history from 04/18/2009 and no changes required. Hypertension Hyperlipidemia Low back pain; history of spinal stenosis Nephrolithiasis, hx of remote colon carcinoma, status post colectomy,  2002  with chronic colostomy history of acute renal failure, secondary to volume depletion, September 2009 suspected ischemic colitis, September 2009  SOCIAL HISTORY:  No tobacco, no alcohol.  Widowed in 2001.  Currently   lives alone since 2001.  Currently lives alone and independent, no cane   or walker use.  She is here with her daughter who is quite supportive.      FAMILY HISTORY/REVIEW OF SYSTEMS:  Otherwise noncontributory.   AST MEDICAL HISTORY:  Illnesses:   1. History of abdominal abscesses, December 2002 with exploratory lap       and adhesiolysis with respiratory arrest and vent episode,       approximately 1 month after tubal and surgery for ovarian mass.   2. Hypertension.   3. History left hip DJD.   4. History of colon cancer in 1998 status  post resection, unknown       stage.   5. History of SBO with resection in 1972.   6. History of postoperative anemia, now resolved.   7. History of cystocele as above.   8. Status post T&A.      ALLERGIES:  NO KNOWN DRUG ALLERGIES.      Review of Systems  The patient denies anorexia, fever, weight loss, weight gain, vision loss, decreased hearing, hoarseness, chest pain, syncope, dyspnea on exertion, peripheral edema, prolonged cough, headaches, hemoptysis, abdominal pain, melena, hematochezia, severe indigestion/heartburn, hematuria, incontinence, genital sores, muscle weakness, suspicious skin lesions, transient blindness, difficulty walking, depression, unusual weight change, abnormal bleeding, enlarged lymph nodes, angioedema, and breast masses.    Physical Exam  General:  overweight-appearing.  180/80overweight-appearing.   Neck:  No deformities, masses, or tenderness noted. Lungs:  Normal respiratory effort, chest expands symmetrically. Lungs are clear to auscultation, no crackles or wheezes. Heart:  Normal rate and regular rhythm. S1 and S2 normal without gallop, murmur, click, rub or other extra sounds. Extremities:  No clubbing, cyanosis, edema, or deformity noted with normal full range of motion of all joints.     Impression & Recommendations:  Problem # 1:  RENAL FAILURE, ACUTE (ICD-584.9)  will follow-up renal function study  Problem # 2:  HYPERTENSION, ESSENTIAL NOS (ICD-401.9)  The following medications were removed from the medication list:    Lisinopril-hydrochlorothiazide 20-12.5 Mg  Tabs (Lisinopril-hydrochlorothiazide) ..... One-half  daily Her updated medication list for this problem includes:    Metoprolol Tartrate 50 Mg Tabs (Metoprolol tartrate) .Marland Kitchen... 1/2 tab two times a day    Amlodipine Besylate 5 Mg Tabs (Amlodipine besylate) ..... One daily    The following medications were removed from the medication list:    Lisinopril-hydrochlorothiazide 20-12.5  Mg Tabs (Lisinopril-hydrochlorothiazide) ..... One-half  daily Her updated medication list for this problem includes:    Metoprolol Tartrate 50 Mg Tabs (Metoprolol tartrate) .Marland Kitchen... 1/2 tab two times a day    Amlodipine Besylate 5 Mg Tabs (Amlodipine besylate) ..... One daily  Complete Medication List: 1)  Metoprolol Tartrate 50 Mg Tabs (Metoprolol tartrate) .... 1/2 tab two times a day 2)  Flaxseed Oil  3)  Calcium 600mg  With Vit D  4)  Fish Oil 1200mg   5)  Multivitamin  6)  Simvastatin 20 Mg Tabs (Simvastatin) .... One daily 7)  Tramadol Hcl 50 Mg Tabs (Tramadol hcl) .... One every 6 hours as needed for pain 8)  Amlodipine Besylate 5 Mg Tabs (Amlodipine besylate) .... One daily  Other Orders: Venipuncture (86578) TLB-BMP (Basic Metabolic Panel-BMET) (80048-METABOL)  Patient Instructions: 1)  Please schedule a follow-up appointment in 1 month. 2)  Limit your Sodium (Salt). 3)  Check your Blood Pressure regularly. If it is above:  170/90 you should make an appointment. Prescriptions: AMLODIPINE BESYLATE 5 MG TABS (AMLODIPINE BESYLATE) one daily  #90 x 3   Entered and Authorized by:   Gordy Savers  MD   Signed by:   Gordy Savers  MD on 05/19/2009   Method used:   Printed then faxed to ...       [facility]  [address]  [phone]* (retail)       [address]       [place], [place]  [address]       Ph: [phone] or [phone]       Fax: [phone]   RxID:   [id number]

## 2010-05-17 NOTE — Assessment & Plan Note (Signed)
Summary: pt will fasting/njr   Vital Signs:  Patient profile:   77 year old female Weight:      181 pounds O2 Sat:      96 % Temp:     98.0 degrees F oral Pulse rate:   82 / minute BP sitting:   140 / 80  (left arm) Cuff size:   regular  Vitals Entered By: Duard Brady LPN (April 20, 2010 9:19 AM) CC: cpx - doing well Is Patient Diabetic? No   Primary Care Provider:  Lesia Hausen  CC:  cpx - doing well.  History of Present Illness: 77 year  old patient who is seen today for a health maintenance examination.  She has a remote history of colon cancer, status post resection with chronic colostomy.  She has treated hypertension, history of lumbar spinal stenosis and chronic low back pain.  She does remarkably well and continues to live independently.  Here for Medicare AWV:  1.   Risk factors based on Past M, S, F history:cardiovascular risk factors include hypertension, dyslipidemia, and age 65.   Physical Activities: fairly sedentary due to her age and arthritis, but does live independently 3.   Depression/mood: history depression, or mood disorder 4.   Hearing: no major hearing deficits 5.   ADL's: independent in all aspects of daily living 6.   Fall Risk: low to moderate due to her age 53.   Home Safety: no problems identified 8.   Height, weight, &visual acuity:height and weight are stable.  No difficulty with visual acuity 9.   Counseling: more regular exercise modest weight loss and encouraged 10.   Labs ordered based on risk factors: laboratory profile, including lipid panel will the reviewed 11.           Referral Coordination- not appropriate at this time 12.           Care Plan- heart healthy diet more regular exercise and modest weight loss.  All encouraged 13.            Cognitive Assessment- alert and oriented, with normal affect.  No cognitive dysfunction   Allergies: 1)  ! Augmentin  Past History:  Past Medical History: Reviewed history from 04/18/2009  and no changes required. Hypertension Hyperlipidemia Low back pain; history of spinal stenosis Nephrolithiasis, hx of remote colon carcinoma, status post colectomy,  2002  with chronic colostomy history of acute renal failure, secondary to volume depletion, September 2009 suspected ischemic colitis, September 2009  SOCIAL HISTORY:  No tobacco, no alcohol.  Widowed in 2001.  Currently   lives alone since 2001.  Currently lives alone and independent, no cane   or walker use.  She is here with her daughter who is quite supportive.      FAMILY HISTORY/REVIEW OF SYSTEMS:  Otherwise noncontributory.   AST MEDICAL HISTORY:  Illnesses:   1. History of abdominal abscesses, December 2002 with exploratory lap       and adhesiolysis with respiratory arrest and vent episode,       approximately 1 month after tubal and surgery for ovarian mass.   2. Hypertension.   3. History left hip DJD.   4. History of colon cancer in 1998 status post resection, unknown       stage.   5. History of SBO with resection in 1972.   6. History of postoperative anemia, now resolved.   7. History of cystocele as above.   8. Status post T&A.  ALLERGIES:  NO KNOWN DRUG ALLERGIES.      Past Surgical History: Reviewed history from 04/18/2009 and no changes required. total colectomy 2002 gravida 4, para 4, abortus zero  ATE OF PROCEDURE:  12/09/2006   DATE OF DISCHARGE:  12/10/2006                                  OPERATIVE REPORT      PROCEDURE:  Cystoscopy, right ureteroscopy with holmium lithotripsy,   left ureteroscopy with stone manipulation, insertion of bilateral double-   J stents.      PREOPERATIVE DIAGNOSIS:  Bilateral ureteral calculi.   Family History: Reviewed history from 06/19/2007 and no changes required. father died age 53 complications of leukemia mother died in her late 26s with a history of nonalcoholic cirrhosis two brothers, elderly, in reasonably good health two older sisters  died in their late 62s; the patient had 66 is the youngest of 5 siblings  Social History: Reviewed history from 06/19/2007 and no changes required. patient is a widow for approximately  7 years; she has 4 children in the area; she lives independently in a single story private home; she continues to drive and is quite independent  Review of Systems       The patient complains of difficulty walking.  The patient denies anorexia, fever, weight loss, weight gain, vision loss, decreased hearing, hoarseness, chest pain, syncope, dyspnea on exertion, peripheral edema, prolonged cough, headaches, hemoptysis, abdominal pain, melena, hematochezia, severe indigestion/heartburn, hematuria, incontinence, genital sores, muscle weakness, suspicious skin lesions, transient blindness, depression, unusual weight change, abnormal bleeding, enlarged lymph nodes, angioedema, and breast masses.    Physical Exam  General:  elderly alert, no distress.  Blood pressure 130/80 Head:  Normocephalic and atraumatic without obvious abnormalities. No apparent alopecia or balding. Eyes:  No corneal or conjunctival inflammation noted. EOMI. Perrla. Funduscopic exam benign, without hemorrhages, exudates or papilledema. Vision grossly normal. Ears:  External ear exam shows no significant lesions or deformities.  Otoscopic examination reveals clear canals, tympanic membranes are intact bilaterally without bulging, retraction, inflammation or discharge. Hearing is grossly normal bilaterally. Nose:  External nasal examination shows no deformity or inflammation. Nasal mucosa are pink and moist without lesions or exudates. Mouth:  Oral mucosa and oropharynx without lesions or exudates. dentures in place Neck:  No deformities, masses, or tenderness noted. Chest Wall:  No deformities, masses, or tenderness noted. Breasts:  No mass, nodules, thickening, tenderness, bulging, retraction, inflamation, nipple discharge or skin changes noted.     Lungs:  Normal respiratory effort, chest expands symmetrically. Lungs are clear to auscultation, no crackles or wheezes. Heart:  Normal rate and regular rhythm. S1 and S2 normal without gallop, murmur, click, rub or other extra sounds. Abdomen:  Bowel sounds positive,abdomen soft and non-tender without masses, organomegaly or hernias noted. colostomy noted.  Right mid abdominal area Msk:  No deformity or scoliosis noted of thoracic or lumbar spine.   Pulses:  not easily palpable Extremities:  No clubbing, cyanosis, edema, or deformity noted with normal full range of motion of all joints.   Neurologic:  No cranial nerve deficits noted. Station and gait are normal. Plantar reflexes are down-going bilaterally. DTRs are symmetrical throughout. Sensory, motor and coordinative functions appear intact. Skin:  Intact without suspicious lesions or rashes Cervical Nodes:  No lymphadenopathy noted Axillary Nodes:  No palpable lymphadenopathy Inguinal Nodes:  No significant adenopathy Psych:  Cognition and  judgment appear intact. Alert and cooperative with normal attention span and concentration. No apparent delusions, illusions, hallucinations   Impression & Recommendations:  Problem # 1:  PREVENTIVE HEALTH CARE (ICD-V70.0)  Orders: Medicare -1st Annual Wellness Visit [phone])  Complete Medication List: 1)  Metoprolol Tartrate 50 Mg Tabs (Metoprolol tartrate) .... 1/2 tab two times a day 2)  Flaxseed Oil  3)  Calcium 600mg  With Vit D  4)  Fish Oil 1200mg   5)  Multivitamin  6)  Simvastatin 20 Mg Tabs (Simvastatin) .... One daily 7)  Tramadol Hcl 50 Mg Tabs (Tramadol hcl) .... One every 6 hours as needed for pain 8)  Amlodipine Besylate 5 Mg Tabs (Amlodipine besylate) .... One daily  Other Orders: EKG w/ Interpretation (93000) Venipuncture (98119) TLB-Lipid Panel (80061-LIPID) TLB-BMP (Basic Metabolic Panel-BMET) (80048-METABOL) TLB-CBC Platelet - w/Differential (85025-CBCD) TLB-TSH  (Thyroid Stimulating Hormone) (84443-TSH) TLB-Hepatic/Liver Function Pnl (80076-HEPATIC)  Patient Instructions: 1)  Limit your Sodium (Salt). 2)  It is important that you exercise regularly at least 20 minutes 5 times a week. If you develop chest pain, have severe difficulty breathing, or feel very tired , stop exercising immediately and seek medical attention. 3)  Take calcium +Vitamin D daily. Prescriptions: AMLODIPINE BESYLATE 5 MG TABS (AMLODIPINE BESYLATE) one daily  #90 x 3   Entered and Authorized by:   Gordy Savers  MD   Signed by:   Gordy Savers  MD on 04/20/2010   Method used:   Electronically to        [facility]  [phone]* (retail)       [address]       [place], [place]  [address]       Ph: [phone] or [phone]       Fax: [phone]   RxID:   [id number] TRAMADOL HCL 50 MG TABS (TRAMADOL HCL) one every 6 hours as needed for pain  #90 x 6   Entered and Authorized by:   Gordy Savers  MD   Signed by:   Gordy Savers  MD on 04/20/2010   Method used:   Electronically to        [facility]  [phone]* (retail)       [address]       [place], [place]  [address]       Ph: [phone] or [phone]       Fax: [phone]   RxID:   [id number] SIMVASTATIN 20 MG  TABS (SIMVASTATIN) one daily  #90 x 6   Entered and Authorized by:   Gordy Savers  MD   Signed by:   Gordy Savers  MD on 04/20/2010   Method used:   Electronically to        [facility]  [phone]* (retail)       [address]       [place], [place]  [address]       Ph: [phone] or [phone]       Fax: [phone]   RxID:   [id number] METOPROLOL TARTRATE 50 MG  TABS (METOPROLOL TARTRATE) 1/2 tab two times a day  #180 x 4   Entered and Authorized by:   Gordy Savers  MD   Signed by:   Gordy Savers  MD on 04/20/2010   Method  used:  Electronically to        [facility]  [phone]* (retail)       [address]       [place], [place]  [address]       Ph: [phone] or [phone]       Fax: [phone]   RxID:   [id number]    Orders Added: 1)  EKG w/ Interpretation [93000] 2)  Medicare -1st Annual Wellness Visit [G0438] 3)  Est. Patient Level III [64332] 4)  Venipuncture [36415] 5)  TLB-Lipid Panel [80061-LIPID] 6)  TLB-BMP (Basic Metabolic Panel-BMET) [80048-METABOL] 7)  TLB-CBC Platelet - w/Differential [85025-CBCD] 8)  TLB-TSH (Thyroid Stimulating Hormone) [84443-TSH] 9)  TLB-Hepatic/Liver Function Pnl [80076-HEPATIC]

## 2010-05-23 NOTE — Assessment & Plan Note (Signed)
Summary: leg pain/dm   Vital Signs:  Patient profile:   77 year old female Weight:      182 pounds Temp:     98.1 degrees F oral BP sitting:   140 / 90  (right arm) Cuff size:   regular  Vitals Entered By: Duard Brady LPN (May 14, 2010 11:11 AM) CC: c/o (L) pain - no fall  no injury Is Patient Diabetic? No   Primary Care Provider:  Lesia Hausen  CC:  c/o (L) pain - no fall  no injury.  History of Present Illness:  77 year old patient who presents with a 3-day history of atraumatic left leg pain.  The pain seems maximal in  the knee and calf area.  Denies any pulmonary complaints.  No prior history of a DVT.  She does have a history of treated hypertension, which has been stable, as well as lumbar spinal stenosis.  She has had no pseudo-claudication or leg pain related to her lumbar spinal stenosis.  Is not complain of any back pain.  Medical regimen includes simvastatin  Allergies: 1)  ! Augmentin  Past History:  Past Medical History: Reviewed history from 04/18/2009 and no changes required. Hypertension Hyperlipidemia Low back pain; history of spinal stenosis Nephrolithiasis, hx of remote colon carcinoma, status post colectomy,  2002  with chronic colostomy history of acute renal failure, secondary to volume depletion, September 2009 suspected ischemic colitis, September 2009  SOCIAL HISTORY:  No tobacco, no alcohol.  Widowed in 2001.  Currently   lives alone since 2001.  Currently lives alone and independent, no cane   or walker use.  She is here with her daughter who is quite supportive.      FAMILY HISTORY/REVIEW OF SYSTEMS:  Otherwise noncontributory.   AST MEDICAL HISTORY:  Illnesses:   1. History of abdominal abscesses, December 2002 with exploratory lap       and adhesiolysis with respiratory arrest and vent episode,       approximately 1 month after tubal and surgery for ovarian mass.   2. Hypertension.   3. History left hip DJD.   4. History of  colon cancer in 1998 status post resection, unknown       stage.   5. History of SBO with resection in 1972.   6. History of postoperative anemia, now resolved.   7. History of cystocele as above.   8. Status post T&A.      ALLERGIES:  NO KNOWN DRUG ALLERGIES.      Past Surgical History: Reviewed history from 04/18/2009 and no changes required. total colectomy 2002 gravida 4, para 4, abortus zero  ATE OF PROCEDURE:  12/09/2006   DATE OF DISCHARGE:  12/10/2006                                  OPERATIVE REPORT      PROCEDURE:  Cystoscopy, right ureteroscopy with holmium lithotripsy,   left ureteroscopy with stone manipulation, insertion of bilateral double-   J stents.      PREOPERATIVE DIAGNOSIS:  Bilateral ureteral calculi.   Family History: Reviewed history from 06/19/2007 and no changes required. father died age 13 complications of leukemia mother died in her late 29s with a history of nonalcoholic cirrhosis two brothers, elderly, in reasonably good health two older sisters died in their late 42s; the patient had 40 is the youngest of 5 siblings  Social History: Reviewed history from 06/19/2007  and no changes required. patient is a widow for approximately  7 years; she has 4 children in the area; she lives independently in a single story private home; she continues to drive and is quite independent  Review of Systems       The patient complains of peripheral edema and difficulty walking.  The patient denies anorexia, fever, weight loss, weight gain, vision loss, decreased hearing, hoarseness, chest pain, syncope, dyspnea on exertion, prolonged cough, headaches, hemoptysis, abdominal pain, melena, hematochezia, severe indigestion/heartburn, hematuria, incontinence, genital sores, muscle weakness, suspicious skin lesions, transient blindness, depression, unusual weight change, abnormal bleeding, enlarged lymph nodes, angioedema, and breast masses.    Physical Exam  General:   elderly alert, in no distress.  Blood pressure 130/84 Neck:  No deformities, masses, or tenderness noted. Lungs:  Normal respiratory effort, chest expands symmetrically. Lungs are clear to auscultation, no crackles or wheezes. Heart:  Normal rate and regular rhythm. S1 and S2 normal without gallop, murmur, click, rub or other extra sounds. Abdomen:  Bowel sounds positive,abdomen soft and non-tender without masses, organomegaly or hernias noted. Msk:  there are no inflammatory changes involving the left knee.  There is mild, calf tenderness Pulses:  R and L carotid,radial,femoral,dorsalis pedis and posterior tibial pulses are full and equal bilaterally Extremities:  plus one left pedal edema   Impression & Recommendations:  Problem # 1:  LEG PAIN, LEFT (ICD-729.5)  Orders: Vascular Clinic (Vascular) will need to check a venous Doppler study to exclude left leg DVT.  Will set up urgently today  Problem # 2:  HYPERTENSION, ESSENTIAL NOS (ICD-401.9)  Her updated medication list for this problem includes:    Metoprolol Tartrate 50 Mg Tabs (Metoprolol tartrate) .Marland Kitchen... 1/2 tab two times a day    Amlodipine Besylate 5 Mg Tabs (Amlodipine besylate) ..... One daily  Problem # 3:  STENOSIS, LUMBAR SPINE (ICD-724.02)  Complete Medication List: 1)  Metoprolol Tartrate 50 Mg Tabs (Metoprolol tartrate) .... 1/2 tab two times a day 2)  Flaxseed Oil  3)  Calcium 600mg  With Vit D  4)  Fish Oil 1200mg   5)  Multivitamin  6)  Simvastatin 20 Mg Tabs (Simvastatin) .... One daily 7)  Tramadol Hcl 50 Mg Tabs (Tramadol hcl) .... One every 6 hours as needed for pain 8)  Amlodipine Besylate 5 Mg Tabs (Amlodipine besylate) .... One daily  Patient Instructions: 1)  Limit your Sodium (Salt). 2)  venous Doppler study of your legs, as scheduled today   Orders Added: 1)  Est. Patient Level IV [16109] 2)  Vascular Clinic [Vascular]

## 2010-05-23 NOTE — Miscellaneous (Signed)
Summary: Orders Update  Clinical Lists Changes  Problems: Added new problem of LEG PAIN, LEFT (ICD-729.5) Orders: Added new Test order of Venous Duplex Lower Extremity (Venous Duplex Lower) - Signed 

## 2010-05-29 ENCOUNTER — Telehealth: Payer: Self-pay

## 2010-05-29 DIAGNOSIS — Z433 Encounter for attention to colostomy: Secondary | ICD-10-CM

## 2010-05-29 NOTE — Telephone Encounter (Signed)
Will need rx for clostomy bags - [facility] Please call

## 2010-05-30 MED ORDER — COLOPLAST DRAINABLE POUCH MISC: 1.0000 | Status: DC | PRN

## 2010-05-30 NOTE — Telephone Encounter (Signed)
Okay -please call in whatever supply she needs

## 2010-05-30 NOTE — Telephone Encounter (Signed)
Please advise 

## 2010-05-30 NOTE — Telephone Encounter (Signed)
Spoke with pharmacist - gave verbal order for colostomy supplies. Will add to med list. KIK  Called hm# to notify pt - no ans no mach. KIK

## 2010-06-08 ENCOUNTER — Telehealth: Payer: Self-pay | Admitting: Internal Medicine

## 2010-06-08 NOTE — Telephone Encounter (Signed)
refaxed rx for supplies. KIK

## 2010-06-08 NOTE — Telephone Encounter (Signed)
Spoke with [name] last week, still waiting on fax for rx for ostomy pouches #20.  Please fax rx to [phone]

## 2010-07-04 LAB — POCT I-STAT, CHEM 8
BUN: 17 mg/dL (ref 6–23)
Calcium, Ion: 1.2 mmol/L (ref 1.12–1.32)
Chloride: 106 mEq/L (ref 96–112)
Creatinine, Ser: 1.3 mg/dL — ABNORMAL HIGH (ref 0.4–1.2)
Glucose, Bld: 114 mg/dL — ABNORMAL HIGH (ref 70–99)
HCT: 34 % — ABNORMAL LOW (ref 36.0–46.0)
Hemoglobin: 11.6 g/dL — ABNORMAL LOW (ref 12.0–15.0)
Potassium: 3.8 mEq/L (ref 3.5–5.1)
Sodium: 139 mEq/L (ref 135–145)
TCO2: 28 mmol/L (ref 0–100)

## 2010-07-04 LAB — POCT CARDIAC MARKERS
CKMB, poc: <1 ng/mL — ABNORMAL LOW (ref 1.0–8.0)
Myoglobin, poc: 124 ng/mL (ref 12–200)
Troponin i, poc: <0.05 ng/mL (ref 0.00–0.09)

## 2010-07-15 ENCOUNTER — Inpatient Hospital Stay (HOSPITAL_COMMUNITY)
Admission: EM | Admit: 2010-07-15 | Discharge: 2010-07-17 | DRG: 669 | Disposition: A | Discharge status: Home / Self Care | Payer: Medicare Other | Attending: Urology | Admitting: Urology

## 2010-07-15 ENCOUNTER — Emergency Department (HOSPITAL_COMMUNITY): Payer: Medicare Other

## 2010-07-15 DIAGNOSIS — N201 Calculus of ureter: Principal | ICD-10-CM | POA: Diagnosis present

## 2010-07-15 DIAGNOSIS — N390 Urinary tract infection, site not specified: Secondary | ICD-10-CM | POA: Diagnosis present

## 2010-07-15 DIAGNOSIS — Z933 Colostomy status: Secondary | ICD-10-CM

## 2010-07-15 DIAGNOSIS — Z85038 Personal history of other malignant neoplasm of large intestine: Secondary | ICD-10-CM

## 2010-07-15 DIAGNOSIS — N1330 Unspecified hydronephrosis: Secondary | ICD-10-CM | POA: Diagnosis present

## 2010-07-15 DIAGNOSIS — Z87442 Personal history of urinary calculi: Secondary | ICD-10-CM

## 2010-07-15 DIAGNOSIS — I10 Essential (primary) hypertension: Secondary | ICD-10-CM | POA: Diagnosis present

## 2010-07-15 DIAGNOSIS — N179 Acute kidney failure, unspecified: Secondary | ICD-10-CM | POA: Diagnosis present

## 2010-07-15 DIAGNOSIS — N135 Crossing vessel and stricture of ureter without hydronephrosis: Secondary | ICD-10-CM | POA: Diagnosis present

## 2010-07-15 LAB — URINE MICROSCOPIC-ADD ON

## 2010-07-15 LAB — BASIC METABOLIC PANEL
Calcium: 10.1 mg/dL (ref 8.4–10.5)
Creatinine, Ser: 2.36 mg/dL — ABNORMAL HIGH (ref 0.4–1.2)
GFR calc non Af Amer: 19 mL/min — ABNORMAL LOW (ref >60)
Glucose, Bld: 140 mg/dL — ABNORMAL HIGH (ref 70–99)
Sodium: 139 mEq/L (ref 135–145)

## 2010-07-15 LAB — URINALYSIS, ROUTINE W REFLEX MICROSCOPIC
Nitrite: NEGATIVE
Specific Gravity, Urine: 1.014 (ref 1.005–1.030)
pH: 6 (ref 5.0–8.0)

## 2010-07-15 LAB — DIFFERENTIAL
Basophils Absolute: 0 10*3/uL (ref 0.0–0.1)
Basophils Relative: 0 % (ref 0–1)
Eosinophils Relative: 1 % (ref 0–5)
Lymphocytes Relative: 18 % (ref 12–46)
Monocytes Absolute: 0.8 10*3/uL (ref 0.1–1.0)
Monocytes Relative: 8 % (ref 3–12)

## 2010-07-15 LAB — CBC
HCT: 41.8 % (ref 36.0–46.0)
MCH: 29.5 pg (ref 26.0–34.0)
MCHC: 31.8 g/dL (ref 30.0–36.0)
RDW: 13.4 % (ref 11.5–15.5)

## 2010-07-16 ENCOUNTER — Inpatient Hospital Stay (HOSPITAL_COMMUNITY): Payer: Medicare Other

## 2010-07-16 LAB — BASIC METABOLIC PANEL
BUN: 23 mg/dL (ref 6–23)
Calcium: 9.6 mg/dL (ref 8.4–10.5)
Creatinine, Ser: 2.13 mg/dL — ABNORMAL HIGH (ref 0.4–1.2)
GFR calc non Af Amer: 22 mL/min — ABNORMAL LOW (ref >60)
Glucose, Bld: 124 mg/dL — ABNORMAL HIGH (ref 70–99)
Potassium: 3.5 mEq/L (ref 3.5–5.1)

## 2010-07-16 LAB — SURGICAL PCR SCREEN: Staphylococcus aureus: NEGATIVE

## 2010-07-17 LAB — BASIC METABOLIC PANEL
BUN: 16 mg/dL (ref 6–23)
CO2: 28 mEq/L (ref 19–32)
Calcium: 9 mg/dL (ref 8.4–10.5)
Creatinine, Ser: 1.61 mg/dL — ABNORMAL HIGH (ref 0.4–1.2)
GFR calc non Af Amer: 30 mL/min — ABNORMAL LOW (ref >60)
Glucose, Bld: 110 mg/dL — ABNORMAL HIGH (ref 70–99)
Sodium: 140 mEq/L (ref 135–145)

## 2010-07-17 NOTE — Op Note (Signed)
[name], [name]           ACCOUNT NO.:  [id number]  MEDICAL RECORD NO.:  [id number]           PATIENT TYPE:  I  LOCATION:  1425                         FACILITY:  [facility]  PHYSICIAN:  Excell Seltzer. Annabell Howells, M.D.    DATE OF BIRTH:  1919-05-27  DATE OF PROCEDURE: DATE OF DISCHARGE:                              OPERATIVE REPORT   Patient of Dr. Larey Dresser.  CHIEF COMPLAINTS:  Left flank pain.  HISTORY:  [name] is an 77 year old white female with a history of stones.  She was last seen by Dr. Vonita Moss in 2009 and had undergone right ureteroscopy with holmium lasertripsy, left ureteroscopic stone manipulation and insertion of bilateral double-J stents in 2008 by myself.  She had the onset earlier last week of left flank pain.  It was intermittent and moderate but yesterday it became severe.  She had nausea without vomiting, but no voiding symptoms or hematuria.  She had no right flank pain, but a CT in the emergency room showed a 2-mm left proximal stone with hydronephrosis but there was also right hydronephrosis to the mid ureter that appeared more chronic without an obvious stone.  Her baseline creatinine is 1.5, but in the ER it was 2.36.  ALLERGIES:  AMOXICILLIN.  MEDICATIONS:  Current medications include an antihypertensive, fish oil, and calcium.  She does not recall exactly what she is on and did not have her list.  PAST MEDICAL HISTORY:  Pertinent for hypertension, colon cancer, history of stones, and UTIs.  PAST SURGICAL HISTORY:  Pertinent for the bilateral ureteroscopy as noted above.  She has also had a colon resection with a colostomy and an exploratory laparotomy for adhesiolysis and left adnexal mass removal. She has had a hysterectomy and an appendectomy.  She is gravida 5, para 5 with vaginal delivery.  SOCIAL HISTORY:  Negative for tobacco or alcohol.  She is widowed.  She lives alone.  Her daughters visit often.  FAMILY HISTORY:   Noncontributory.  REVIEW OF SYSTEMS:  She has had no fever or chills.  She denies headache.  She has had no chest pain, but does have some mild extremity edema.  She denies shortness of breath.  She denies diarrhea, constipation, or problems with her colostomy.  She denies skin rash. She denies any neurologic complaints and denies depression or anxiety. She has had no obvious adenopathy.  She denies vaginal bleeding.  She denies hematuria or dysuria, but does have mild incontinence and occasionally wears pads.  Review of systems otherwise entirely negative on a full review.  PHYSICAL EXAMINATION:  VITAL SIGNS:  Temperature is 98.2, blood pressure is 144/82, pulse 59, respiration is 20. GENERAL:  She is a well-developed, well-nourished, elderly, white female in no acute distress.  Alert and oriented x3. HEAD AND FACE:  Normocephalic, atraumatic. NECK:  Supple without mass or bruit. LUNGS:  Clear to auscultation with a normal effort. HEART:  Regular rate and rhythm without murmur. ABDOMEN:  Soft, flat, with mild to moderate left CVA tenderness.  There is a right lower quadrant colostomy with parastomal herniation.  No masses or hepatosplenomegaly are noted.  No inguinal hernias are noted.  She has no inguinal or femoral adenopathy. GENITOURINARY:  Examination deferred. RECTAL:  Examination deferred. SKIN:  Warm and dry without lesions. EXTREMITIES:  Have full range of motion with mild edema. NEUROLOGIC:  She has no focal deficits and has a normal mood and affect.  ACCESSORY CLINICAL INFORMATION:  CT scan films and report were reviewed and compared to prior films from 2009.  She has right hydronephrosis with some chronic thinning of the parenchyma.  The hydronephrosis extends to the mid ureter without clear cause.  She also has left hydronephrosis with a 2-mm left proximal ureteral stone that was difficult to visualize.  LABORATORY DATA:  BMET this morning:  Sodium 140, potassium  3.5, chloride 107, CO2 27, BUN 23, creatinine 2.13 which is down from 2.36 yesterday, glucose 124.  CBC:  White count 9.6, hemoglobin 13.3, hematocrit 41.8, platelet count 201.  Urine is nitrite negative with 11- 20 white cells, too numerous to count red cells, many bacteria.  Culture was obtained and is pending.  Prior office and hospital notes reviewed and she had no right hydronephrosis in 2009.  Baseline creatinine 1.5. The operative procedure was noted above.  IMPRESSION: 1. Small left proximal ureteral stone with left hydronephrosis. 2. Right hydronephrosis with some chronic parenchymal stenting,     possibly due to ureteral stricture. 3. Acute-on-chronic renal insufficiency. 4. History of urinary tract infection with pyuria and bacteriuria     today, but no signs of systemic infection.  PLAN:  I have begun Cipro pending a culture.  She will need to go to the operating room today for cystoscopy, bilateral retrograde pyelography and possible ureteroscopy and stent placement.  The risks of bleeding, infection, ureteral injury, thrombotic events, and anesthetic complications were reviewed.  I will obtain a preoperative chest x-ray and EKG because of her advanced age.     Excell Seltzer. Annabell Howells, M.D.     JJW/MEDQ  D:  07/16/2010  T:  07/16/2010  Job:  [id number]  cc:   Zenaida Niece, M.D. Fax: [phone]  Sandria Bales. Ezzard Standing, M.D. [address] [place] [address]  Corwin Levins, MD [address] [place] [address]  Electronically Signed by Bjorn Pippin M.D. on 07/17/2010 07:09:04 PM

## 2010-07-17 NOTE — Discharge Summary (Signed)
  [name], [name]           ACCOUNT NO.:  [id number]  MEDICAL RECORD NO.:  [id number]           PATIENT TYPE:  I  LOCATION:  1425                         FACILITY:  [facility]  PHYSICIAN:  Maretta Bees. Vonita Moss, M.D.DATE OF BIRTH:  [birth year]  DATE OF ADMISSION:  07/15/2010 DATE OF DISCHARGE:  07/17/2010                              DISCHARGE SUMMARY   FINAL DIAGNOSES: 1. Left ureteral calculus  2.Right ureteral stricture 3.UTI 4.Hypertension.  PROCEDURES:  Cystoscopy and bilateral retrograde pyelograms and left ureteroscopic stone removal and insertion of left double-J catheter.  HISTORY:  This is a lady with a past history of stones who was admitted with left flank pain.  She had a small stone in the proximal left ureter and was brought to the operating room where evaluation revealed that the stone is now in the distal ureter.  It was removed and a double-J catheter was left in place because her creatinine had gone up to 2.36.Also she was found to have a blind ending right ureter.  She was admitted and her postoperative course was unremarkable.  She remained afebrile.  Her creatinine came down to 1.61.  She was feeling well and ready for discharge on July 17, 2010.  She will go home on diet and activity as tolerated.  MEDICATIONS ON DISCHARGE: 1. Tramadol 50 mg p.r.n. 2. Multivitamins. 3. Flax seed oil. 4..  Fish oil. 1. Calcium carbonate. 2. Vitamin D. 3. Aspirin 81 mg. 4. Metoprolol tartrate. 5. Amlodipine 5 mg a day. 6. Cipro 250 mg p.o. b.i.d.  CONDITION ON DISCHARGE:  She was sent home in improved condition.  FOLLOWUP:  She will return to the office in 2 days in followup for cystoscopy and stent removal.     Maretta Bees. Vonita Moss, M.D.    LJP/MEDQ  D:  07/17/2010  T:  07/17/2010  Job:  [id number]  Electronically Signed by Larey Dresser M.D. on 07/17/2010 08:16:54 PM

## 2010-08-02 NOTE — Op Note (Signed)
NAMEPARISSA, CHIAO           ACCOUNT NO.:  [id number]  MEDICAL RECORD NO.:  [id number]           PATIENT TYPE:  I  LOCATION:  1425                         FACILITY:  [facility]  PHYSICIAN:  Heloise Purpura, MD      DATE OF BIRTH:  1919-07-06  DATE OF PROCEDURE:  07/16/2010 DATE OF DISCHARGE:                              OPERATIVE REPORT   PREOPERATIVE DIAGNOSES: 1. Bilateral hydronephrosis. 2. Probable left ureteral calculus. 3. Acute kidney injury.  POSTOPERATIVE DIAGNOSES: 1. Bilateral hydronephrosis. 2. Probable left ureteral calculus. 3. Acute kidney injury.  PROCEDURES: 1. Cystoscopy. 2. Bilateral retrograde pyelography with interpretation. 3. Left ureteroscopy with stone removal. 4. Left ureteral stent placement (6 x 24).  SURGEON:  Heloise Purpura, MD  ANESTHESIA:  General.  COMPLICATIONS:  None.  INTRAOPERATIVE FINDINGS:  Right retrograde pyelography revealed a blind- ending ureter at the level of the sacrum.  No contrast was seen above this level, likely consistent with complete obstruction.  Retrograde pyelography on the left did reveal a filling defect within the distal left ureter consistent with a ureteral calculus.  No other filling defects or other abnormalities were identified.  INDICATION:  [name] is an 77 year old female who is a patient of Dr. Sharon Seller Peterson's.  She presented last evening with left-sided flank pain and an acute kidney injury with a serum creatinine that had increased to above 2 with a baseline of 1.5.  She underwent a CT scan which did reveal a questionable 2-mm left distal ureteral calculus.  On her CT scan, she also was noted to have what appeared to be long- standing chronic right hydronephrosis with a chronically dilated ureter and thinned parenchyma.  After discussing options, she was admitted to the hospital by Dr. Bjorn Pippin. It was recommended that she proceed with the above procedures.  Due to my availability, I was  asked to assist her and proceed with these procedures.  We, therefore, discussed the potential risks and complications as well as alternative treatment options and she did give her informed consent to proceed.  DESCRIPTION OF PROCEDURE:  The patient was taken to the operating room and a general anesthetic was administered.  She was given preoperative antibiotics earlier and was now placed in the dorsal lithotomy position and prepped and draped in the usual sterile fashion.  Next, cystourethroscopy was performed which revealed no evidence of any bladder tumors, stones, mucosal abnormalities, or urethral abnormalities.  The right ureter was cannulated with a 6-French ureteral catheter and Omnipaque contrast was injected.  This revealed a normal- caliber ureter up to approximately the level of the iliac vessels where the contrast bluntly and abruptly stopped.  A 0.038 sensor guidewire was advanced up to this area and curled within the distal ureter.  The 6- French ureteral catheter was further advanced up to the level of obstruction.  Again, Omnipaque contrast was injected with again no contrast seen extending above this level.  The ureteral catheter was, therefore, removed and brought to the left ureteral orifice which was cannulated.  Omnipaque contrast was injected on this side and there was noted to be a filling defect within the distal ureter consistent with  the patient's probable calculus.  A 0.038 sensor guidewire was advanced up in the left renal pelvis under fluoroscopic guidance.  The 6-French semi-rigid ureteroscope was then advanced next to the wire and the stone was identified.  The stone was of the size what was felt that it could be removed.  Therefore, a zero-tip nitinol basket was used to retrieve the stone without difficulty.  Due to fact that the patient had developed an acute kidney injury, it was felt that she should undergo ureteral stent placement, particularly in the  setting of what appeared to be a functional solitary kidney.  Therefore, the wire was back loaded on the cystoscope and a 6 x 24 double-J ureteral stent was advanced over the wire using Seldinger technique.  It was positioned appropriately under fluoroscopic and cystoscopic guidance and the wire removed with a good curl noted in the renal pelvis as well as in the bladder.  The patient's bladder was emptied and the procedure ended.  She tolerated the procedure well and without complications.  She was able to be awakened and transferred to the recovery unit in satisfactory condition.     Heloise Purpura, MD     LB/MEDQ  D:  07/16/2010  T:  07/16/2010  Job:  [id number]  Electronically Signed by Heloise Purpura MD on 08/02/2010 06:01:50 AM

## 2010-08-28 NOTE — Consult Note (Signed)
NAME:  [name], [name] NO.:  [id number]   MEDICAL RECORD NO.:  [id number]          PATIENT TYPE:  INP   LOCATION:  1619                         FACILITY:  [facility]   PHYSICIAN:  Alfonse Ras, MD   DATE OF BIRTH:  [birth year]   DATE OF CONSULTATION:  DATE OF DISCHARGE:                                 CONSULTATION   REASON FOR CONSULTATION:  Questionable parastomal hernia and vague  abdominal pain, history of ureteral stone, and current ureteral stone  referred by Dr. Larey Dresser.   HISTORY OF PRESENT ILLNESS:  The patient is an 77 year old white female  was seen in the emergency room last night for lower abdominal  discomfort.  Underwent CAT scan which showed a couple of air-fluid  levels in the small bowel without significant dilatation, possibility of  stool in the colon.  She did have a ureteral stone.  The patient was  seen by Dr. Larey Dresser in the office today and admitted for pain  control.  The patient did have some nausea this morning and two episodes  of emesis; but on my visit tonight, she is hungry, and denies any nausea  at this time.  She has had some gas through her bag, but minimal other  output.  She has had a known parastomal hernia in the past.   PHYSICAL EXAMINATION:  On physical exam, she is minimally tender around  the parastomal hernia site.  The remainder of the abdomen is benign with  normal bowel sounds.   IMPRESSION:  Ileus secondary to renal pelvic urolithiasis versus bowel  obstruction.   PLAN:  We will try a Fleet enema through her colostomy tonight and  repeat KUB in the morning; but otherwise continue management per Dr.  Larey Dresser.      Alfonse Ras, MD  Electronically Signed     KRE/MEDQ  D:  03/18/2008  T:  03/19/2008  Job:  [id number]

## 2010-08-28 NOTE — Discharge Summary (Signed)
NAMEHASINA, KREAGER NO.:  [id number]   MEDICAL RECORD NO.:  [id number]          PATIENT TYPE:  INP   LOCATION:  1527                         FACILITY:  [facility]   PHYSICIAN:  Corwin Levins, MD      DATE OF BIRTH:  Apr 08, 1920   DATE OF ADMISSION:  12/27/2007  DATE OF DISCHARGE:  01/02/2008                               DISCHARGE SUMMARY   DISCHARGE DIAGNOSES:  1. Colitis non-Clostridium difficile.  She had pathology consistent      with ischemic colitis with inflammatory exudate.  2. Ischemic colitis as above.  3. Urinary tract infection.  4. Acute renal insufficiency.  5. Volume depletion/dehydration.  6. Transient hypokalemia.  7. Chronic renal insufficiency with baseline creatinine approximately      1.5.  8. Hypertension.  9. History of small-bowel obstruction.  10.History of colon cancer, remotely.  11.History of dyslipidemia.  12.History of renal stones, status post cystoscopy and retrograde      stent.  13.Status post lithotripsy, August 2008.  14.Status post colostomy, right lower quadrant, for colon cancer as      above.  15.Degenerative joint disease.  16.History of exploratory laparotomy for abdominal abscesses and lysis      of adhesions in 2002 complicated by respiratory failure requiring      prolonged ventilation.  17.Elevated lipase initially this admission thought secondary to renal      insufficiency and colitis.  18.History of cystocele.  19.Status post tonsillectomy and adenoidectomy.   CONSULTATIONS:  1. Nephrology, Dr. Eliott Nine, December 27, 2007, who agreed with initial      stopping of the ACE inhibitor due to renal insufficiency and volume      replacement for volume depletion and metabolic acidosis with      elevated anion gap secondary to the renal failure.  2. Gastroenterology, Dr. Leone Payor   PROCEDURES:  1. CT of the abdomen and pelvis consistent with colitis.  2. Flexible sigmoidoscopy, December 30, 2007, with ulcer and     inflammation at the distal colon near the colostomy noted.      Pathology as above, otherwise normal.  No obvious pseudomembranous      colitis noted.   HISTORY AND PHYSICAL:  See that documented per EMR per Dr. Debby Bud on  date of admission.   HOSPITAL COURSE:  Ms. Dancer is an 77 year old white female who had  onset of acute diarrheal illness of unclear etiology associated with  gradual volume depletion, lower blood pressure, and apparent ischemic  colitis per flexible sigmoidoscopy, pathology as above.  She was  admitted with hypotension, acute on chronic renal insufficiency,  metabolic acidosis with elevated anion gap, questionable pancreatitis,  colitis and questionable UTI on the day of admission.  She was treated  with aggressive IV fluid.  ACE inhibitor was stopped, antibiotics  administered, urology consultation performed as above, and  gastroenterology consultation addressed as above as well.  She was noted  to have elevated lipase on admission over 500.  This was felt to be  somewhat of a red herring due to the colitis and the diarrheal illness.  Flexible sigmoidoscopy performed as  above with pathology as above.  C.  diff stool toxin negative multiple times.  She was treated with Flagyl  in addition to eventually Cipro for the urinary tract infection.  The  urine culture was not helpful.  She remained afebrile and gradually  improved with respect to blood pressure, orthostatic symptoms, and  stamina to the point where she was ambulatory, eating better.  In  addition, by the time of discharge, she finally had much less watery  stool, but it was more loose stool as per usual and much less volume.  Course was also complicated by persistent hypokalemia thought related to  the high volume of watery stool.  Potassium at time of discharge was  3.3, and she will be administered more potassium p.o. prior to  discharge.  As she was ambulatory, eating well, stool returning much   more towards normal, and no further hypotension, it was felt she had  gained maximal benefit from this hospitalization and is to be discharged  home.  Of note is that her potassium was 3.3 at the time of discharge,  BUN 31, creatinine 1.5.  Electrolytes otherwise within normal limits  with gap resolved.  The white blood cell count had jumped on Sunday labs  to 13.1, but this was felt to not justify further need for evaluation or  prolonged hospitalization stay.  Her hemoglobin remained stable  throughout with discharge hemoglobin at 12.7 despite the pathological  diagnosis of ischemic colitis as above.  Blood pressure likewise  remained not elevated off her ACE inhibitor at the time of discharge.   DISPOSITION:  Discharged to home in good condition.  She plans to stay  with her daughter who just retired from [employer] and can be with  her more or less continuously.  She plans to follow up with Dr. Alwyn Ren  in 1-2 weeks.   DISCHARGE MEDICATIONS:  1. Cipro 500 mg p.o. b.i.d. for 3 more days.  2. Metoprolol 50 mg 1/2 p.o. q.a.m. only.  3. Simvastatin 20 mg p.o. daily.  4. Multiple supplements including calcium, fish oil and multivitamin.      Corwin Levins, MD  Electronically Signed     JWJ/MEDQ  D:  01/03/2008  T:  01/04/2008  Job:  [id number]   cc:   Titus Dubin. Alwyn Ren, MD,FACP,FCCP  [phone] [place]  [place] [address]

## 2010-08-28 NOTE — Op Note (Signed)
NAMEJESSYE, IMHOFF NO.:  [id number]   MEDICAL RECORD NO.:  [id number]          PATIENT TYPE:  INP   LOCATION:  1432                         FACILITY:  [facility]   PHYSICIAN:  Maretta Bees. Vonita Moss, M.D.DATE OF BIRTH:  1919-06-09   DATE OF PROCEDURE:  11/26/2006  DATE OF DISCHARGE:                               OPERATIVE REPORT   PREOPERATIVE DIAGNOSIS:  Bilateral hydronephrosis due to bilateral mid  ureteral stones with suspected urinary tract infection and fever.   POSTOPERATIVE DIAGNOSIS:  Bilateral hydronephrosis due to bilateral mid  ureteral stones with suspected urinary tract infection and fever.   PROCEDURE:  Cystoscopy, bilateral retrograde pyelograms with  interpretation, bilateral double-J catheter placement.   SURGEON:  Maretta Bees. Vonita Moss, M.D.   ANESTHESIA:  General.   INDICATIONS:  This 77 year old lady presented with severe left flank  pain and fever to 103.5 degrees today. She was seen in the [facility] ER and  referred to me by Dr. Oliver Barre and I transferred her to [facility]  for further urologic care. She was informed about the need for  cystoscopy, retrograde pyelograms, and double-J catheter placement for  immediate control of her hydronephrosis, fever and infection.   DESCRIPTION OF PROCEDURE:  The patient was brought to the operating room  and placed in the lithotomy position.  The external genitalia was  prepped and draped in the usual fashion.  She was cystoscoped and the  bladder was unremarkable except for some descensus.  A soft tip  guidewire was placed up the left ureter but would not go by the stone  and, therefore, I inserted an open ended ureteral catheter and through  that, a Glidewire was inserted which did bypass the stone and easily go  into the left renal collecting system.  The  open ended catheter was  then manipulated up into the renal pelvis and left renal pelvic urine  collected.   Retrograde pyelogram was performed  through the open ended catheter  placed cystoscopically and she was noted to have mild to moderate  pyelocaliectasis with no extravasation.  I then inserted a 6-French 26  cm contour double-J catheter with a full coil in the renal pelvis and a  full coil in the bladder with the string removed. In like fashion, I  inserted a metal guidewire up on the right side but this did go by  easily without the need for a Glidewire and, again, an open ended  ureteral catheter was used to perform a right retrograde pyelogram and  collect urine was for culture from the right renal pelvis. Again, she  had mild moderate pyelocaliectasis and a 6-French 26 cm contour double-J  catheter was placed with a full coil in the renal collecting system and  a full coil in the bladder.  At this point, the bladder was emptied, the  cystoscope removed, and the patient sent to the recovery room in good  condition with essentially no blood loss having tolerated procedure  well.      Maretta Bees. Vonita Moss, M.D.  Electronically Signed     LJP/MEDQ  D:  11/26/2006  T:  11/27/2006  Job:  [id number]   cc:   Titus Dubin. Alwyn Ren, MD,FACP,FCCP  [phone] [place]  [place] [address]   Corwin Levins, MD  [address]  [place]  [place] [address]

## 2010-08-28 NOTE — H&P (Signed)
[name], [name] NO.:  [id number]   MEDICAL RECORD NO.:  [id number]          PATIENT TYPE:  EMS   LOCATION:  MAJO                         FACILITY:  [facility]   PHYSICIAN:  Corwin Levins, MD      DATE OF BIRTH:  06-01-1919   DATE OF ADMISSION:  11/26/2006  DATE OF DISCHARGE:                              HISTORY & PHYSICAL   CHIEF COMPLAINT:  Left flank pain and high fever since 5 a.m. today.   HISTORY OF PRESENT ILLNESS:  [name] is a very nice 77 year old  white female who came into the emergency room this morning after an  onset of severe left flank pain, worse than having a baby and nausea and  vomiting since 5 a.m.  Overall symptoms are now improved after treatment  in the ER.  She denies any specific GU symptoms or hematuria.  She does  have a history of cystocele in the past.  She also denies any high  fevers or chills prior to coming to the ER, although she did have a  temperature of 103.5 when she arrived.  There are no other further  complaints.  No history of renal stones in the past or obstruction.   PAST MEDICAL HISTORY:  Illnesses:  1. History of abdominal abscesses, December 2002 with exploratory lap      and adhesiolysis with respiratory arrest and vent episode,      approximately 1 month after tubal and surgery for ovarian mass.  2. Hypertension.  3. History left hip DJD.  4. History of colon cancer in 1998 status post resection, unknown      stage.  5. History of SBO with resection in 1972.  6. History of postoperative anemia, now resolved.  7. History of cystocele as above.  8. Status post T&A.   ALLERGIES:  NO KNOWN DRUG ALLERGIES.   CURRENT MEDICATIONS:  1. Benazepril 20 mg p.o. daily.  2. Metoprolol 25 mg p.o. b.i.d.  3. Baby aspirin 81 mg, though she has not taken recently.   SOCIAL HISTORY:  No tobacco, no alcohol.  Widowed in 2001.  Currently  lives alone since 2001.  Currently lives alone and independent, no cane  or  walker use.  She is here with her daughter who is quite supportive.   FAMILY HISTORY/REVIEW OF SYSTEMS:  Otherwise noncontributory.   PHYSICAL EXAM:  VITAL SIGNS:  Temperature of 103.5, O2 saturation 97%,  blood pressure 163/89, heart rate 74, respirations 18.  ENT EXAM:  Sclerae clear.  TMs clear.  Oropharynx benign.  NECK:  No lymphadenopathy, JVD, thyromegaly.  CHEST:  No rales or wheezing.  CARDIAC:  Regular rate and rhythm.  ABDOMEN:  Soft with suprapubic tenderness and left flank tenderness as  well.  Abdomen otherwise benign.  No organomegaly.  EXTREMITIES:  No edema.  NEUROLOGICAL:  She is alert and otherwise oriented x3.   LABORATORY:  White blood cell count 10.2, hemoglobin 14.3, electrolytes  within normal limits, BUN 13, creatinine 1.3, glucose slightly elevated  at 135.  UA with white blood cell count too numerous to count with  positive nitrites and  leukocyte esterase.  CT scan of the abdomen and  pelvis:  Preliminary report only, as this was done less than 30 minutes  ago, shows bilateral renal stones with obstruction.   ASSESSMENT AND PLAN:  1. Left pyelonephritis:  To be admitted.  Given IV fluids, IV      antibiotics, and pain control.  Will need to follow up blood and      urine cultures.  2. Bilateral renal stones with obstruction:  Will need urology      consult.  Hold the aspirin for now and any other anticoagulant in      case of procedure.  3. Hypertension:  Otherwise, continue medications.  4. Other medical problems as above per HPI:  Continue home      medications.  5. Code status.  6. Prophylaxis:  Give empiric proton pump inhibitor therapy and      sequential compression devices.   DISPOSITION:  Hopefully for home when improved.      Corwin Levins, MD  Electronically Signed     JWJ/MEDQ  D:  11/26/2006  T:  11/26/2006  Job:  [id number]   cc:   Alwyn Ren, MD  Kristine Garbe Ezzard Standing, M.D.  Sandria Bales. Ezzard Standing, M.D.

## 2010-08-28 NOTE — Consult Note (Signed)
[name], [name] NO.:  [id number]   MEDICAL RECORD NO.:  [id number]          PATIENT TYPE:  INP   LOCATION:  1527                         FACILITY:  [facility]   PHYSICIAN:  Aram Beecham B. Eliott Nine, M.D.DATE OF BIRTH:  Jul 21, 1919   DATE OF CONSULTATION:  12/27/2007  DATE OF DISCHARGE:                                 CONSULTATION   REASON FOR CONSULTATION:  We are asked by Dr. Ranell Patrick to see this very  nice 77 year old white female because of renal failure.  She has a past  medical history consisting of hypertension, history of colectomy with  colostomy in the past and prior history of kidney stones.  She has a  week or more history of weakness and diarrheal stool from her colostomy.  She saw Dr. Amador Cunas because of these symptoms on September 10.  Blood pressure in the office was 84/60.  Benazepril was discontinued,  and she was also told to reduce her metoprolol, but her daughter held  both of the medications.  No labs were done on that visit.   She presented today to the emergency department at [facility] with  continued complaints of weakness and pain all over.  She was hypotensive  on arrival with a blood pressure 89/36 and with an IV fluid bolus  increased to 119/55.  Her labs returned to BUN and creatinine of 125 and  14.7 with a potassium of 3.5 and a CO2 of 17.   Of note, her baseline creatinine was 0.9 in June 2009.   Foley catheter was placed and urine output thus far has been about 300  mL.   The patient reports loss of appetite and poor p.o. intake for over a  week, nausea without vomiting, pain all over and now abdominal pain.  Colostomy had been pouring out large amounts of diarrheal stool, but  according to the daughter, was emptied only about three times yesterday.  Other labs in the emergency room were notable for an elevated lipase a  547 compatible with pancreatitis.  She had a CT of the abdomen and  pelvis done which showed some right  nephrolithiasis but no evidence for  hydronephrosis or ureteral calculi.  There was some distal colonic wall  thickening and gallstones were present, although the pancreas itself  looked okay.   This patient's baseline is very functional.  Two weeks ago she was  driving her car.  She sings in the choir at church and lives alone  taking care of her activities of daily living and most of her affairs on  her own.   PAST MEDICAL HISTORY:  1. Hypertension (was on benazepril until December 24, 2007).  2. Dyslipidemia.  3. History of kidney stones status post cystoscopy and retrograde      stents in August 2008 because of ureteral stones and underwent      lithotripsy at that time.  4. History of colon cancer with colostomy.  5. DJD.  6. History of exploratory laparotomy for abdominal abscesses and lysis      of adhesions in 2002, complicated by an arrest and ventilator  dependent respiratory failure.   OUTPATIENT MEDICINES:  Fish oil, calcium with vitamin D, multivitamins,  simvastatin 20 mg a day, Lomotil.  Her benazepril and metoprolol have  been on hold since September 10.   CURRENT MEDICINES:  IV fluids and Unasyn.   ALLERGY:  Augmentin.   FAMILY HISTORY:  Noncontributory.   SOCIAL HISTORY:  The patient lives in [place].  She is a widow.  She  does not drink or smoke and never has.  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  She had five daughters.  Husband is deceased.  She is very active in her  church in [place].   REVIEW OF SYSTEMS:  Positive for pain all over and some mid abdominal  pain.  She has had diarrheal stools from her colostomy for a week.  She  denies chest pain or shortness of breath.  She has been anorectic, but  no vomiting, no swelling, no cough.  She is not sure if urine output  volumes have been the same, but  she has still been voiding.  She has had  weakness and it has been difficult to walk because of that.  She has had  no bleeding.   PHYSICAL EXAMINATION:  GENERAL:  She is on an elderly white female seen  in the emergency department on a stretcher.  She is very slow to answer  questions, but appropriate in her responses, and able to answer  questions correctly.  She has poor skin and tissue turgor.  VITAL SIGNS:  Blood pressure was 119/55 up from 89/36 after fluids.  HEENT:  She had a very marked facial tic.  Mouth was tacky.  She had  dentures in place.  NECK:  Neck veins were flat in a supine position.  LUNGS:  Clear.  CARDIAC:  Rhythm was regular with frequent PVCs.  ABDOMEN:  She had a colostomy in the right lower quadrant which was  leaking brown stool all over the bed clothes.  She had scars on her  abdomen from prior surgeries and some mild generalized abdominal  tenderness without rebound.  EXTREMITIES:  She had no edema of the lower extremities.  Her nails were  clean and in fair repair.   LABORATORY:  WBC 10,600, hemoglobin 13.6.  sodium 123, potassium 3.5,  chloride 89, CO2 17, BUN 125, creatinine 14.8, total protein 6.6,  albumin 3.4, troponin 0.02, lipase 547, hemoglobin 13.6, anion gap was  17.  Urinalysis specific gravity 1.019, moderate hemoglobin, 30 mg  percent protein, large leukocyte esterase and too numerous to count  white cells.  Chest x-ray was free of edema.  CT of the abdomen showed  right-sided nephrolithiasis but no hydronephrosis or hydroureter and no  ureteral calculi.  There was distal colonic wall thickening of the  transverse colon which was herniated through the abdominal wall defect  at the hernia site.  Gallstones were present.   IMPRESSION:  An 77 year old white female with a prior history of  hypertension,  kidney stones, colostomy for colon cancer in the past who  presents with:  1. Acute renal failure in the setting of volume depletion and       hypotension from diarrhea from her colostomy and poor p.o. intake.      ACE inhibitor was on board until September 10 (three days ago).  She has been hypotensive since at least that time.  She is dry by      physical exam.  CT scan shows no evidence for hydronephrosis.      Hopefully, since she does have urine output, and since her baseline      renal function is quite normal for her age, she will turn around      with IV fluids.  The patient states that she would undergo dialysis      if it was felt necessary if renal function does not improve.  2. Metabolic acidosis with elevated anion gap secondary to renal      failure.  Diarrhea probably has contributed some.  Since there is      evidence for both pancreatitis by laboratory parameters and colitis      by CT, and her gap is elevated, would check a lactate level as      well.  Change IV fluids to contain some sodium bicarbonate.  3. Pancreatitis.  To be managed with pain medications and clear      liquids per primary service.  4. Colitis by CT with recent diarrheal illness.  Need to rule out C      difficile.  5. Urinary tract infection by urinalysis - needs urine culture.      Antibiotics as per the primary service.   Thanks for asking [place] to see this patient.  Will follow closely with you.  She has no indications for dialysis this evening, but will watch  carefully for same.      Duke Salvia Eliott Nine, M.D.  Electronically Signed     CBD/MEDQ  D:  12/27/2007  T:  12/28/2007  Job:  [id number]   cc:   [facility]   Gordy Savers, MD  [address] [place]  [place] [address]

## 2010-08-28 NOTE — Consult Note (Signed)
[name], PETRELLA NO.:  [id number]   MEDICAL RECORD NO.:  [id number]          PATIENT TYPE:  INP   LOCATION:  1432                         FACILITY:  [facility]   PHYSICIAN:  Maretta Bees. Vonita Moss, M.D.DATE OF BIRTH:  02-27-1920   DATE OF CONSULTATION:  11/26/2006  DATE OF DISCHARGE:                                 CONSULTATION   I was asked to see this 77 year old white female by Dr. Jonny Ruiz for  evaluation of fever and urinary tract obstruction.  She has no history  of stones but awoke this morning with severe left flank pain.  She also  presented in the emergency room with a temperature of 103.5.  She has  not had a lot of UTIs in the past.  She was febrile to 103.5, and blood  cultures and urine cultures were done.  She was started on Cipro  intravenously. A CT scan was done that showed bilateral mid ureteral  stones causing bilateral obstruction. The stone on the left was  approximately 4 x 8 mm in size, and there were two stones on the right,  each about 3 mm in size.  She also had some perirenal inflammation,  worse on the left than the right.  Other findings included gallstones,  arteriosclerotic changes in the aorta, scoliosis, and colostomy in the  right lower quadrant.   It was felt that she needed cystoscopy, retrograde pyelograms, bilateral  double-J catheters placed to control her infection.   She has a past history of intra-abdominal abscesses with exploratory  laparotomy in the past.  She had small bowel obstructions.  She has a  history of cystocele repair and T&A.   MEDICATIONS:  Include benazepril. metoprolol.   Alcohol:  None.   Tobacco:  None.   She is a widow and lives alone independently   She has had no headache, shortness of breath, chest pain.  She denies  diarrhea or blood in her stools   PHYSICAL EXAMINATION:  VITAL SIGNS:  Blood pressure was 163/89, pulse  74, temperature 100.3.  GENERAL:  She was, however, alert and oriented  despite being in  distress.  NECK:  Supple.  LUNGS:  No respiratory distress.  HEART:  Tones regular.  There is slight guarding in the left flank.  Otherwise the abdomen was soft and benign.  Bladder was nontender.  EXTREMITIES:  No edema.   IMPRESSION:  Bilateral hydronephrosis due to bilateral mid ureteral  stones complicated by fever and suspected urinary tract infection.   PLAN:  Cystoscopic treatment with retrograde pyelograms and double-J  catheters.      Maretta Bees. Vonita Moss, M.D.  Electronically Signed     LJP/MEDQ  D:  11/26/2006  T:  11/27/2006  Job:  [id number]   cc:   Corwin Levins, MD  [address]  [place]  [place] [address]   Titus Dubin. Alwyn Ren, MD,FACP,FCCP  [phone] [place]  [place] [address]

## 2010-08-28 NOTE — Op Note (Signed)
[name], [name]           ACCOUNT NO.:  [id number]   MEDICAL RECORD NO.:  [id number]          PATIENT TYPE:  AMB   LOCATION:  [facility]                         FACILITY:  [facility]   PHYSICIAN:  Excell Seltzer. Annabell Howells, M.D.    DATE OF BIRTH:  [birth year]   DATE OF PROCEDURE:  12/09/2006  DATE OF DISCHARGE:  12/10/2006                               OPERATIVE REPORT   PROCEDURE:  Cystoscopy, right ureteroscopy with holmium lithotripsy,  left ureteroscopy with stone manipulation, insertion of bilateral double-  J stents.   PREOPERATIVE DIAGNOSIS:  Bilateral ureteral calculi.   POSTOPERATIVE DIAGNOSIS:  Bilateral ureteral calculi, with a right renal  calculus.   SURGEON:  Dr. Bjorn Pippin.   ANESTHESIA:  General.   SPECIMEN:  Stone fragments.   DRAINS:  Bilateral 6-French x 26 cm double-J stent.   COMPLICATIONS:  None.   INDICATIONS:  [name] is an 77 year old white female who initially  presented with a fever and bilateral hydronephrosis related to 3 mm left  ureteral stones and one 8 mm right mid ureteral stone Dr. Vonita Moss had  previously placed stents.  She returns today for ureteroscopic treatment  of her stones.   FINDINGS AND PROCEDURE:  The patient was taken the operating room where  she received Cipro.  General anesthetic was induced.  She was placed in  lithotomy position.  Her perineum and genitalia were prepped Betadine  solution.  She was draped in the usual sterile fashion.  Cysto was  performed  in the usual fashion with a 22-French scope and the 12  degrees lens.  The left ureteral stent was visualized, grasped with  grasping forceps and pulled through the urethral meatus.  A wire was  then advanced to the kidney through the stent which was then removed.  A  6-French short ureteroscope was then passed alongside the wire.  Stone  was initially seen in the distal ureter but I was not able to find it  when I returned with a basket to retrieve it.  The ureteroscope was  advanced as far proximally as possible to the level of the iliacs, the  stones were not seen.  I then used a 12/14 introducer dilator sheath  which was placed over the wire to advance the proximal ureter.  The  dilator coil and wire were removed.  A 6-French flexible ureteroscope  was then passed through the introducer sheath.  Two 3-mm stones were  identified and retrieved with a nitinol basket from the proximal ureter.  Inspection of the internal collecting system revealed no additional  calculi.  The guidewire was then reinserted to the kidney.  The dilator  sheath was removed.  The cystoscope was replaced over the wire and a 6-  French 26 cm double-J stent was inserted without difficulty under  fluoroscopic guidance.  There was good coil in the kidney and good coil  in the bladder upon removal the wire.   I then used a grasping forceps to pull the right ureteral stent through  the urethral meatus.  A guidewire was then advanced to the kidney  and  the 6-French short ureteroscope  was then advanced along the wire.  I was  not able to advance this scope beyond the iliac vessels and the stone  had not been reached at that level.  The 12/14 introducer sheath was  then reinserted over the wire to the lower portion of proximal ureter.  The core was removed along with the wire and a 6-French flexible  ureteroscope was then advanced.  Her 8 mm ureteral stone was identified  in the proximal ureter in a somewhat impacted location.  The 200 micron  holmium laser fiber was then passed through the flexible scope and the  stone was fragmented.  The larger fragments were then removed with a  nitinol basket.  I then advanced the scope into the kidney.  Inspection  revealed another stone in the mid to lower pole calix that was not  adherent to the papilla. This stone was then engaged with the holmium  laser.  It appeared to measure approximately 8 to 10 mm.  The stone  fragmented into 1-2 mm fragments  without difficulty.  I was unable to  retrieve the fragments with a nitinol basket due to angulation.  No  other stones were identified and the collecting system.  At this point  there were guidewire was reinserted through the introducer sheath to the  kidney.  The introducer sheath was removed.  The cystoscope was inserted  over the wire and a 6-French 26 cm double-J stent was then was inserted  to the kidney under fluoroscopic guidance.  The wire was removed leaving  good coil in the kidney, good coil in the bladder.  The bladder was then  drained.  The patient was taken down from lithotomy position.  Her  anesthetic was reversed.  She was removed to the recovery room in stable  condition.  There no complications.      Excell Seltzer. Annabell Howells, M.D.  Electronically Signed     JJW/MEDQ  D:  12/09/2006  T:  12/10/2006  Job:  [id number]   cc:   Maretta Bees. Vonita Moss, M.D.  Fax: [phone]

## 2010-08-28 NOTE — Discharge Summary (Signed)
[name], [name] NO.:  [id number]   MEDICAL RECORD NO.:  [id number]          PATIENT TYPE:  INP   LOCATION:  1432                         FACILITY:  [facility]   PHYSICIAN:  Stacie Glaze, MD    DATE OF BIRTH:  03/08/1920   DATE OF ADMISSION:  11/26/2006  DATE OF DISCHARGE:  11/29/2006                               DISCHARGE SUMMARY   ADMISSION DIAGNOSIS:  Pyelonephritis.   DISCHARGE DIAGNOSES:  1. Pyelonephritis.  2. Urinary tract obstruction.   PROCEDURE:  Double J stent placed by Dr. Vonita Moss.   HOSPITAL COURSE:  The patient was admitted for acute pyelonephritis in  the setting of chronic urinary tract infection with an obstruction  pattern and was seen by urology. Bilateral stents were placed by urology  with resolution of pain. Cultures were Escherichia coli sensitive to  penicillin, cephalosporins, fluoroquinolones and Macrodantin. Initial  white count was 23, and repeat white count was reduced to 19,000.   She was afebrile on antibiotics and converted to oral antibiotics the  day prior to admission.   Her blood and urine cultures were consistent with Escherichia coli  sensitivities as mentioned. Would discharge home on Augmentin at this  time.   Her discharge instructions include followup with Dr. Vonita Moss in one  week for post-surgical stent placement, followup with Dr. Alwyn Ren in one  week for the pyelonephritis. New medicines are Enablex 15 mg 1 daily and  Augmentin 875 twice a day for 10 days. She is informed that this may  cause loose stools, and she could take yogurt as a possible therapeutic  intervention for these loose stools. Should the stools become severe,  she should contact her on-call physician for her primary care practice.  She is also given Magic mouth wash for thrush 3 times a day gargle and  spit. She is to resume her benazepril 20 and her metoprolol 15. She is  given a prescription for pain of Darvocet-N 100 one p.o. q.4h.  as  needed. For yeast, she is given a dose of Diflucan now and a repeat dose  in 5 days.   She is discharged to home in stable condition.      Stacie Glaze, MD  Electronically Signed     JEJ/MEDQ  D:  11/29/2006  T:  11/29/2006  Job:  [id number]

## 2010-08-28 NOTE — Discharge Summary (Signed)
NAMEBRENDA, [name] NO.:  [id number]   MEDICAL RECORD NO.:  [id number]          PATIENT TYPE:  INP   LOCATION:  1619                         FACILITY:  [facility]   PHYSICIAN:  Maretta Bees. Vonita Moss, M.D.DATE OF BIRTH:  1919/05/24   DATE OF ADMISSION:  03/18/2008  DATE OF DISCHARGE:  03/21/2008                               DISCHARGE SUMMARY   FINAL DIAGNOSES:  1. Abdominal pain, probably due to ileus.  2. Right renal stone.  3. Stromal hernia.  4. Hypertension.  5. Hypercholesterolemia.   PROCEDURES:  None.   HISTORY:  This is an 77 year old lady who developed abdominal pain and  decreased ostotomy output and had been in the emergency room where a CT  scan showed a nonobstructing right renal pelvic stone.  Because of the  concern about possible bowel obstruction and persistent pain, she was  admitted for further therapy and evaluation.   PHYSICAL EXAMINATION:  She is a pleasant elderly white lady in distress,  slight guarding and tenderness around the right lower quadrant stomal  hernia.   HOSPITAL COURSE:  After admission, she was seen in consultation by Dr.  Colin Benton of the general surgery service, who thought that she had an ileus  that resolved with enemas.  In the meantime, she never developed fever,  hematuria, or obvious flank pain consistent with a stone.  On the  morning of March 21, 2008, she was afebrile.  Abdominal pain was gone.  Her colostomy was functioning well.  She was ready for discharge.  She  is to go home on diet and activity as tolerated.  She will return to the  office in one week for followup.  She was sent home in good condition.   DISCHARGE MEDICATIONS:  1. Oxycodone p.r.n. pain.  2. She will continue on her 20 mg of Zocor daily, on 1/2 of a 25-mg      Lopressor tab b.i.d., her usual vitamins.      Maretta Bees. Vonita Moss, M.D.  Electronically Signed     LJP/MEDQ  D:  03/21/2008  T:  03/21/2008  Job:  [id number]

## 2010-08-28 NOTE — H&P (Signed)
NAMEBRAILEY, BUESCHER NO.:  [id number]   MEDICAL RECORD NO.:  [id number]          PATIENT TYPE:  INP   LOCATION:  1619                         FACILITY:  [facility]   PHYSICIAN:  Maretta Bees. Vonita Moss, M.D.DATE OF BIRTH:  12-11-1919   DATE OF ADMISSION:  03/18/2008  DATE OF DISCHARGE:                              HISTORY & PHYSICAL   HISTORY:  This 77 year old lady has had a past history of stones and had  the onset last night of nausea, vomiting and enough abdominal pain to  warrant a trip to the emergency room.  She had no fever.  She had no  gross hematuria or acute voiding complaints except for maybe a little  frequency.  In the emergency room CT scan was done and she had right  renal pelvic stone that had moved from the calyceal system but really  did not cause any hydronephrosis.  Also she had a large herniated  colostomy stoma and some air fluid levels in her bowel.  She was  referred to our office today.  She came in today still complaining of  abdominal pain mainly around her stoma.  She did not put out any gas or  stool since she replaced her colostomy bag last night.  Again, she did  not run any fever and having no hematuria and is not having any outright  flank pain.   I was not convinced that this stone is causing her discomfort and I  talked to Dr. Colin Benton regarding general surgery consultation.  She did  review last night's CT and was not concerned about a significant risk of  bowel obstruction.   However, in view of her clinical situation and uncertainty of the  etiology of her pain and her age I felt it best to admit her for further  therapy and evaluation.   PAST MEDICAL HISTORY:  She has a past history of hypertension and  hypercholesterolemia.  She has had no heart attack or stroke.   PAST SURGICAL HISTORY:  Included the colostomy and resection of bowel  after laparoscopic gynecologic exploration several years ago.   MEDICATIONS:  1. She takes a  half a blood pressure pill b.i.d. but cannot recall the      name.  2. She is also on a statin drug for cholesterol.   ALLERGIES:  She is allergic to AMOXICILLIN.   SOCIAL HISTORY:  Alcohol none.  Tobacco none.   REVIEW OF SYSTEMS:  Denies chest pain, shortness of breath, diarrhea,  blood in her stools or increased ankle swelling.  She denies headache or  dizziness.   PHYSICAL EXAMINATION:  GENERAL:  On examination she is a pleasant  elderly lady appearing in distress who is alert and oriented.  NECK:  Supple.  No respiratory distress.  HEART:  Heart tones regular.  ABDOMEN:  Slightly guarded in the region of her right lower quadrant  stoma and hernia.  There is no CVA tenderness.   IMPRESSION:  1. Abdominal pain of uncertain etiology.  2. Right renal stone.  3. Stomal hernia.  4. Hypertension.  5. Hypercholesterolemia.   PLAN:  She will  be admitted for IV fluids and pain meds and general  surgery consultation.  I have cultured her urine here in the office in  the meantime.      Maretta Bees. Vonita Moss, M.D.  Electronically Signed     LJP/MEDQ  D:  03/18/2008  T:  03/19/2008  Job:  [id number]

## 2010-08-31 NOTE — Discharge Summary (Signed)
[name]  Patient:    [name], [name] Visit Number: [id number] MRN: [id number]          Service Type: SUR Location: 4W [id number] Attending Physician:  [name] Dictated by:   [name], M.D. Admit Date:  04/12/2001 Discharge Date: 05/05/2001   CC:         [name], M.D. [name]  Zenaida Niece, M.D.  Charlaine Dalton. Sherene Sires, M.D. [name]   Discharge Summary  DATE OF BIRTH:  02/07/1920.  DISCHARGE DIAGNOSES: 1. Multiple intra-abdominal abscesses felt to be secondary to probable bowel    perforation. 2. Respiratory failure requiring ventilation for approximately one week. 3. Malnutrition requiring TPN. 4. Postoperative anemia. 5. Intra-abdominal abscess requiring percutaneous postoperatively. 6. Bilateral pleural effusions, left greater than right. 7. Hypokalemia. 8. Hypertension.  PROCEDURES:  The patient had an abdominal exploration with evacuation of intra-abdominal abscesses, a right transverse in-colostomy and Hartmanns pouch, and enterolysis of adhesions on the April 12, 2001.  HISTORY OF PRESENT ILLNESS:  [name] is a pleasant 77 year old white female who was a patient of Dr. Titus Dubin. Alwyn [name], who on the 16th of December 2002 underwent a laparotomy for a questionable pelvic mass by [name]. [name].  She spent about four days in the [name] but never really felt good after being discharged and was readmitted on the 29th of December 2002 with significant free air under her diaphragm and a white blood count of 18,200.  She actually originally presented to the [name] but then transferred to [name] for abdominal exploration.  PAST MEDICAL HISTORY:  Most significant that she has a history of hypertension.  She also has a history of abdominal bowel obstruction some 30 years prior to admission and had a prior colon resection by Dr. Marnee Spring. [name] in 1998 for a colon  cancer stage undetermined.  [name] COURSE:  The patient was taken to the operating [name] on the day of admission where she was found to have a large left subdiaphragmatic abscess, large pelvic abscess, and multiple intraloop abscesses over extensive intra-abdominal adhesions.  She underwent and evacuation of these abscesses.  I did a diverting colostomy but I could never identify a specific point of bowel perforation but could also not see the bowel well in the pelvis.  I did extensive enterolysis of adhesions.  Postoperatively, the patient had a very stormy postoperative course.  On the first postoperative day, she had a borderline urine output.  Her hemoglobin was 10, hematocrit 30, white blood cell count of 17,000.  Sodium 135, potassium of 4.1, chloride of 105, CO2 of 26, glucose of 120, BUN of 15.  She was placed initially on [name] and [name] as an antibiotic.  She seemed to slowly get better over the first four to five days postoperatively.  She did develop a significant left pleural effusion seen by chest x-ray, and by the fourth postoperative day, her hemoglobin remained at 10, hematocrit at 30, white blood cell count 14,800, and her urine output was up to 100 cc an hour.  I started her on TPN since I really was not sure about her bowel perforator and wanted to make sure she had good gut motility before starting a diet.  I then repeated a CT scan on the April 21, 2001 which showed at least two major collections and some more minor collections intraloop and I had these drained percutaneously in radiology.  However, over the  12 hours postdrainage, the patient developed increasing respiratory difficulty and required intubation.  Dr. Charlcie Cradle. Delford [name] initially consulted the patient and was later followed by [name] while she was in the intensive care unit.  There was a question of whether this was caused by sepsis and her antibiotics were changed to [name]  and vancomycin.  Her hemoglobin also drifted down to 8 with hematocrit of 25. White blood cell count was 9900 and she continued on TPN.  We also, at one time, tried to tap off her pleural effusion which seemed to help a little bit.  Her ventilator management was primarily by [name] while she was on the unit with event.  She never really seemed septic.  I really think this was more of a progressive weakness and just fatigue from her significant illness.  She was finally extubated on the April 26, 2001.  She tolerated extubation fairly well.  She was also started on tube feedings which she tolerated well and then converted her over to p.o. tube feedings.  I repeated another CT scan which showed improvement in the drained collection and some slight improvement in the other undrained collection but no significant new collection or worrisome air.  She still had the pleural effusion.  Her diet was steadily increased and well tolerated.  She ran a borderline potassium during this time and then a cultured abdominal wound revealed a vancomycin resistant enterococcus.  However, her wound was coming nicely.  She had been changed to ______ to cover the vancomycin-resistant enterococcus and continued on the [name].  I continued the [name] for a total of 12 days and actually her total antibiotic coverage I think was for 21 days.  The ______ was continued for eight days, but since her wound was doing so well, I discontinued this.  She is now 23 days postoperative.  She is afebrile and doing well.  The biggest thing is she still has pulmonary fatigue with much physical activity but her chest x-ray shows significant improvement in her pulmonary effusion. She is eating fairly well and her blood pressure is borderline with a potassium of 3.2.   Her daughter, Margaree Mackintosh, is going to have her stay at her house.  We will arrange home health care to go by and address her open  abdominal wound.  I have removed her retention sutures.  She also has a colostomy which she will be managed by home health care and Amy Judie Petit has been following.  I think she is now ready for discharge.  DISCHARGE INSTRUCTIONS:  Her discharge instructions will include a regular diet, colostomy wound care per home health.  She will see me back in one week for follow-up.  I have encouraged her to see Dr. Titus Dubin. Hopper within two weeks of discharge to readjust and check all her medications.  She knows to call for any problems or questions.  DISCHARGE MEDICATIONS: 1. We will send her home on a multivitamin. 2. She will be on Lopressor 12.5 mg b.i.d. 3. Potassium tablet 20 mEq b.i.d. Dictated by:   [name], M.D. Attending Physician:  [name] DD:  05/05/01 TD:  05/05/01 Job: [id number]

## 2010-08-31 NOTE — Op Note (Signed)
[facility]  Patient:    Karina, [name] Visit Number: [id number] MRN: [id number]          Service Type: SUR Location: 4W 0460 02 Attending Physician:  Michaele Offer Dictated by:   Zenaida Niece, M.D. Proc. Date: 03/30/01 Admit Date:  03/30/2001                             Operative Report  PREOPERATIVE DIAGNOSIS:  Left pelvic mass.  POSTOPERATIVE DIAGNOSIS:  Significant abdominal and pelvic adhesions.  Pelvic  PROCEDURE:  Open laparoscopy with significant adhesiolysis followed by laparotomy with further adhesiolysis and removal of left adnexal structure.  SURGEON:  Zenaida Niece, M.D.  ASSISTANT:  Huel Cote, M.D.  ANESTHESIA:  General endotracheal tube.  ESTIMATED BLOOD LOSS:  100 cc.  FINDINGS:  Significant omental and bowel adhesions to the anterior abdominal wall and pelvis.  She had a normal-appearing uterus.  There was no mass visualized or palpated in the pelvis.  What I feel is probably her left fallopian tube, was removed.  PROCEDURE IN DETAIL:  The patient was taken to the operating room and placed in the dorsal supine position.  General anesthesia was induced, and she was placed in mobile stirrups.  Her abdomen, perineum, and vagina were then prepped and draped in the usual sterile fashion and a Foley catheter inserted and a Hulka tenaculum applied to her cervix for uterine manipulation. Her infraumbilical skin was then infiltrated with 0.25% Marcaine, and a 3 cm horizontal incision was made.  The fascia was grasped and entered sharply, and the peritoneum was then entered sharply as well.  There was noted to be significant adhesions once the peritoneal cavity was entered.  This was freed up first digitally.  A pursestring suture of 0 Vicryl was then placed around the fascia, and the Hasson cannula was introduced.  The abdomen was then insufflated with CO2 gas and laparoscope confirmed placement.   Adhesions were in the midline and significant on her right side as well as significant adhesions on her left side.  The operative scope was used to free-up these adhesions sharply and bluntly.  A 5 mm port was then placed in the left side under direct visualization to aid in freeing up adhesions.  Again, taking significant time and effort as bowel and omentum were adherent to the anterior abdominal wall, using blunt and sharp dissection, the omentum and bowel were freed from the left side all the way to the midline allowing me to finally visualize the pelvis.  Once I was able to visualize the pelvis, bowel was also noted to be adherent to the uterus.  Some of this was freed up sharply and bluntly to mobilize the uterus slightly.  However, I was not able to achieve adequate mobilization of the uterus through the scope nor could I adequately visualize the left adnexa.  At this point, the decision was made to proceed with laparotomy.  Laparoscopic instruments were removed, and the pursestring suture of 0 Vicryl was tied.  Her abdomen was then entered in the low midline through her previous vertical incision.  Bowel was noted to be again stuck to the midline and to the right of the midline, and this was freed up bluntly and sharply.  This was done to allow the bowel to be packed out from the pelvis. The uterus was grasped with a towel clamp and then a single-tooth tenaculum to elevate it.  Adhesions from the posterior portion of the uterus were freed up bluntly and sharply and bleeding controlled with electrocautery.  Attention was focused on the left adnexa as this is where ultrasound states there is a solid mass.  I was unable to palpate and neither was Dr. Senaida Ores.  We were able to free up adhesions on the left side, to visualize what we feel was the left fallopian tube.  This was clamped, transected, and ligated with 0 Vicryl and then freed from the uterus using electrocautery.  The left  ureter was palpated well below any suture lines.  Bleeding from posterior to the uterus on the left side was controlled with 3-0 Vicryl.  I was unable to specifically identify the left ovary due to the significant adhesions.  However, again there was no significant pelvic mass palpated or visualized.  Hemostasis was then achieved with electrocautery, and the pelvis was irrigated and found to be hemostatic.  At this point, again, no further masses were palpated, and I felt that the patient was in danger of significant bowel injury if I proceeded further, so I elected to terminate the procedure at this point.  Packs were removed from the abdomen and bowel was inspected.  She did have a small serosal tear in her small bowel which was repaired with two interrupted sutures of 2-0 silk.  All other sites were found to be hemostatic, and bowel was intact.  The pelvis was then filled with Intergel once the patient was taken out of Trendelenburg to hopefully help prevent as many adhesions.  The fascia was closed with running #1 PDS starting at both ends and meeting in the middle.  Subcutaneous tissue was irrigated and made hemostatic with electrocautery.  It was then closed with running 3-0 Vicryl.  All skin incisions were then closed with staples and a sterile dressing.  The patient tolerated the procedure well, was extubated in the operating room, and taken to the recovery room in stable condition. Dictated by:   Zenaida Niece, M.D. Attending Physician:  Michaele Offer DD:  03/30/01 TD:  03/31/01 Job: [phone]

## 2010-08-31 NOTE — Discharge Summary (Signed)
[facility]  Patient:    [name], [name] Visit Number: [id number] MRN: [id number]          Service Type: SUR Location: 4W 0453 01 Attending Physician:  Andre Lefort Dictated by:   Zenaida Niece, M.D. Admit Date:  04/12/2001 Discharge Date: 05/05/2001                             Discharge Summary  ADMISSION DIAGNOSIS:  Left pelvic mass.  DISCHARGE DIAGNOSIS:  Significant abdominal and pelvic adhesions.  PROCEDURES:  Laparoscopy with adhesiolysis followed by laparotomy with adhesiolysis and removal of a small left adnexal structure.  COMPLICATIONS:  None.  CONSULTATIONS:  None.  HISTORY AND PHYSICAL:  Please see the chart for a full history and physical, but briefly this is a 77 year old white female who was seen for an annual exam in July 2002.  She was doing well except had some left lower quadrant and left pelvic fullness.  Ultrasound revealed initially an echogenic mass in the left adnexal area.  Repeat ultrasound revealed a slightly increased size in the mass, which now appeared to be solid in structure.  Due to this possibly growing solid mass, the decision was made to take the patient to the operating room and remove the mass.  PAST MEDICAL HISTORY:  Hypertension.  PAST SURGICAL HISTORY:  Tonsillectomy and a colectomy for colon cancer in 1998.  PHYSICAL EXAMINATION:  ABDOMEN:  Benign with a vertical scar and no palpable masses.  PELVIC:  Left adnexal mild fullness and she does have a grade 3 cystocele.  HOSPITAL COURSE:  The patient was admitted on the day of surgery and underwent an open laparoscopy with adhesiolysis followed by laparotomy with adhesiolysis and removal of a small left adnexal structure.  This was done under general anesthesia with an estimated blood loss of 100 cc.  She was noted to have significant omental and bowel adhesions to the abdominal wall and pelvis.  She had a normal uterus.  There was  no specific pelvic mass visualized or palpated.  I did remove a small left adnexal structure which pathology has revealed to be just benign fibroadipose and fibromuscular tissue. Postoperatively, the patient did very well.  She had good pain control, initially remained afebrile, and had adequate urine output.  Her abdomen was initially distended and soft and her dressing was clean.  Preoperative hemoglobin 14.0, postoperative was 11.9.  She was kept on sips of clear liquids on postoperative day #1 and her PCA was continued and she was ambulated.  On the morning of postoperative day #2, which was December 18, she complained of nausea and vomiting early after I saw her on December 17 but had done well since then and had actually passed flatus.  She had remained afebrile.  Abdomen had decreased distention and her incisions were healing well.  At that point, we advanced her to full liquids.  The evening of December 18, she had one temperature to 101.5.  That was her only postoperative temperature.  She was otherwise afebrile.  She did have bowel movements on postoperative day #2.  On December 19, which was postoperative day #3, her incisions were healing well and there was decreased distention.  I allowed her to eat a regular diet and monitor her temperature and encouraged incentive spirometry.  Throughout the day on December 19, she was able to tolerate a regular diet and remained afebrile.  Thus, on the evening  of December 19, she was felt to be stable enough for discharge home.  CONDITION ON DISCHARGE:  Stable.  DISPOSITION:  Discharged to home.  DISCHARGE INSTRUCTIONS:  Her diet is a regular diet.  Her activity is no strenuous activity.  Her followup is in five days for staple removal.  DISCHARGE MEDICATIONS:  Percocet p.r.n. pain. Dictated by:   Zenaida Niece, M.D. Attending Physician:  Andre Lefort DD:  05/15/01 TD:  05/15/01 Job: [phone]

## 2010-08-31 NOTE — H&P (Signed)
[facility]  Patient:    [name], [name] Visit Number: [id number] MRN: [id number]          Service Type: Attending:  Zenaida Niece, M.D. Dictated by:   Zenaida Niece, M.D. Adm. Date:  03/30/01                           History and Physical  CHIEF COMPLAINT:  Pelvic mass.  HISTORY OF PRESENT ILLNESS:  This is a 77 year old, white female, gravida 5, para 5-0-0-5, who was seen for an annual GYN exam on November 11, 2000.  She was doing well at that time, but on exam had some left lower quadrant/left pelvic fullness.  A pelvic ultrasound performed in August revealed a 2.7 cm echogenic mass in the left adnexal area.  This was followed with a repeat ultrasound on February 16, 2001.  This revealed slightly increased size in the mass which now appeared to be an ovoid solid structure measuring 3.2 x 1.5 x 1.8 cm.  Due to the presence of a solid adnexal mass, I have recommended surgical removal and the patient is admitted for this at this time.  PAST OBSTETRICAL HISTORY:  Five vaginal deliveries at term with no complications.  PAST MEDICAL HISTORY:  She has hypertension.  PAST SURGICAL HISTORY:  Tonsillectomy, as well as a colectomy for a colon cancer in 02-Sep-1996.  ALLERGIES:  None known.  CURRENT MEDICATIONS:  She is on a blood pressure medication which she could not quote me the name or dose of the medication.  SOCIAL HISTORY:  The patient does not smoke.  Her husband died in Sep 03, 1999.  FAMILY HISTORY:  No GYN or colon cancer.  REVIEW OF SYSTEMS:  She does have some mild urine leakage with strain and does have to wear a pad without nocturia and no urinary urgency.  She also had some skin lesions for which she is to follow up with dermatology.  PHYSICAL EXAMINATION:  Weight is 184 pounds.  VITAL SIGNS:  The blood pressure is 120/80 and pulse 72.  GENERAL APPEARANCE:  She is a slightly overweight white female who is in no acute distress.  HEENT:   Pupils are equally round and reactive to light and accommodation. Extraocular muscles are intact.  The oropharynx is clear without erythema or exudates.  NECK:  Supple without lymphadenopathy or thyromegaly.  BREASTS:  The breast exam in the sitting and supine position reveals no dominant masses, adenopathy, skin change, or nipple discharge.  LUNGS:  Clear to auscultation.  HEART:  Regular rate and rhythm without murmur.  ABDOMEN:  Benign with a vertical scar and no palpable masses.  PELVIC:  Normal external genitalia with a grade 2 perineal defect.  The speculum exam reveals a normal cervix.  A Pap smear from July of 2002 was normal.  Bimanual exam reveals a small, mid planar, nontender uterus with a slight fullness in the left adnexa.  This is confirmed on rectovaginal exam. She also has a grade 3 cystocele and no rectocele.  ASSESSMENT:  Persistent solid left adnexal mass.  I have recommended surgical removal of this to rule out a neoplastic process.  The patient is agreeable to this.  I have discussed the possibility that this could be malignant and that she may need a laparotomy with staging and the patient also understands this. The risks of surgery, including bleeding, infection, and damage to surrounding organs have also been discussed with the patient.  PLAN:  Admit the patient on the day of surgery for an open laparoscopy with removal of this left pelvic mass and frozen section.  If this is neoplastic, then she will undergo a staging laparotomy. Dictated by:   Zenaida Niece, M.D. Attending:  Zenaida Niece, M.D. DD:  03/27/01 TD:  03/27/01 Job: [id number]

## 2010-08-31 NOTE — Op Note (Signed)
[facility]  Patient:    [name], [name] Visit Number: [id number] MRN: [id number]          Service Type: MED Location: [facility] Attending Physician:  Michaele Offer Dictated by:   Sandria Bales. Ezzard Standing, M.D. Proc. Date: 04/12/01 Admit Date:  04/12/2001   CC:         Zenaida Niece, M.D.  Titus Dubin. Alwyn Ren, M.D. [facility]   Operative Report  DATE OF BIRTH:  Aug 13, 1919  PREOPERATIVE DIAGNOSIS:  Massive pneumoperitoneum, suspicion for bowel perforation.  POSTOPERATIVE DIAGNOSIS:  Large pelvic and subdiaphragmatic abscess, suspicious for bowel perforation but no perforation found, extensive intra-abdominal adhesions.  OPERATION:  Abdominal exploration and evacuation of abscess, lysis of adhesions spending two plus hours lysing adhesions and then end transverse colostomy with Hartman pouch in the left colon.  SURGEON:  Sandria Bales. Ezzard Standing, M.D.  ASSISTANT:  Zenaida Niece, M.D.  ANESTHESIA:  General endotracheal.  ESTIMATED BLOOD LOSS:  500 cc.  WOUND:  The wound was packed open.  INDICATIONS:  [name] is a 77 year old white female who is approximately 13 days after an abdominal exploration for removal of a pelvic mass which was not found at the time of exploration.  She was discharged home after three days but over the last three days had increasing abdominal distention and tenderness and presented to the [facility] where a KUB revealed massive free air.  She was transferred to the [facility] Operating Room for abdominal exploration.  DESCRIPTION OF PROCEDURE:  The patient was placed in the supine position and given 1 g of cefotetan at the initiation of the procedure.  The NG tube was placed and Foley catheter placed.  Abdomen was prepped with Betadine solution and sterilely draped.  A long midline incision was made.  When I entered the abdomen, I encountered two major abscesses.  One involved her pelvis and left colonic  gutter and then there was a massive abscess of air over the dome under her stomach under her left hemidiaphragm.  She has had some other smaller interloop abscesses.  The majority of the operation was over two hours taking enterlysing the adhesions.  She had extensive dense adhesions, and I was able to identify the ligament of Treitz.  I ran the entire small bowel to the terminal ileum, but there was a ball of bowel at the terminal ileum that did not appear to be damaged.  It was densely scarred and I thought that trying to free this up would only damage more bowel than it would provide any benefit. I think this was damaged.  I ran the bowel at least four or five times looking for any obvious hole in the small intestines and noticed she had several areas of inflammation where the small bowel had been part of a wall of an abscess. I could find no obvious perforation of the small bowel.  It appeared that she had a prior sigmoid colectomy.  She had a history of a prior colon cancer operation about four years ago but because her sigmoid colon was so straight but again I could find no obvious perforation in her sigmoid colon.  There were a lot of inflammatory changes anterior to the this, and the uterus has very little space in the cul-de-sac behind the uterus.  I irrigated the abdomen out with about 7 liters of saline.  I had an estimated blood loss of some 500 cc.  There was a lot of oozing in  taking down these adhesions and again I ran the small bowel and colon and could not find a perforation.  My assumption is that she had an injury at the time of her gynecologic procedure, that whatever perforation had sealed and she developed a residual abscess.  Because I was still concerned about the distal sigmoid colon, I felt she would be best served with a diverting colostomy and came up to the transverse colon which the simplest place that I could find.  I took her transverse colon and put it in  the right upper quadrant for a transverse colostomy in the right upper quadrant, stapled off its distal end and marked that with Prolene sutures.  It may be noted if I have to reexplore her that I will probably have to mobilize her splenic flexure to get the loops of bowel together, that she had extensive purulent material which may make gallbladder dissection very hard and as expected I spent a large amount of time trying to redissect out her small bowel.  Again, there was this terminal ileum which I never did dissect out but again it looked like old chronic adhesions.  No evidence of any perforation or any inflammatory exudate with that.  I then performed the colostomy in the right upper quadrant, sewed it to the anterior abdominal wall using 3-0 Vicryl suture.  I cut about a 2.5 cm hole in the right upper quadrant above the bowel.  It looked fresh.  The transverse colon was transected with the staple gun and the mesentery taken down using 2-0 silk sutures.  I then closed the abdomen with interrupted #1 Novofil sutures. I put two retention sutures in the lower part of the abdomen.  I could not put retention sutures in the upper part of the abdomen because of the colostomy.  Because of the gross contamination of the abdominal cavity I left her abdominal wound open and just put Betadine gauze in it.  She tolerated the procedure well.  She was transferred directly to the ICU postoperatively.  Her colostomy looks pink.  She certainly for her age and condition has a significant insult.  We will have to watch carefully.  Her sponge and needle count were correct at the end of the case. Dr. Jackelyn Knife was my first assistant during the procedure. Dictated by:   Sandria Bales. Ezzard Standing, M.D. Attending Physician:  Michaele Offer DD:  04/12/01 TD:  04/12/01 Job: [id number]

## 2010-08-31 NOTE — Discharge Summary (Signed)
[facility]  Patient:    [name], [name] Visit Number: [id number] MRN: [id number]          Service Type: MED Location: [facility] Attending Physician:  Michaele Offer Dictated by:   Sandria Bales. Ezzard Standing, M.D. Admit Date:  04/12/2001                             Discharge Summary  NO DICTATION Dictated by:   Sandria Bales. Ezzard Standing, M.D. Attending Physician:  Michaele Offer DD:  04/12/01 TD:  04/12/01 Job: [phone] [id number]

## 2010-08-31 NOTE — Consult Note (Signed)
[facility]  Patient:    [name], [name] Visit Number: [id number] MRN: [id number]          Service Type: MED Location: [facility] Attending Physician:  Michaele Offer Dictated by:   Charlcie Cradle Delford Field, M.D. [facility] Proc. Date: 04/22/01 Admit Date:  04/12/2001 Discharge Date: 04/12/2001   CC:         Sandria Bales. Ezzard Standing, M.D.  Titus Dubin. Alwyn Ren, M.D. [facility]   Consultation Report  CHIEF COMPLAINT:  Respiratory distress and arrest.  HISTORY OF PRESENT ILLNESS:  This is a 77 year old white female admitted on April 12, 2001, with abdominal abscesses. At that time, she underwent exploratory laparotomy by the general surgery service and was found to have extensive intra-abdominal adhesions and abscesses in the large pelvic area and left subdiaphragmatic area. No obvious bowel perforations seen. She had a right transverse colostomy with a Hartmanns pouch. Evacuation of abscess and enterolysis of adhesions. Followup on this resulted in progressive recurrent abscesses. She underwent percutaneous drainage today of these but then tonight developed acute respiratory distress and hypoxemic arrest with mucous plugging. The patient was intubated at large because plug was seen and removed. The patient earlier had desaturations throughout the day and increased work of breathing. Chest x-ray earlier had shown increasing atelectasis bases. The patient is now on full mechanical ventilatory support. We were asked by the general surgery service to assist in help in the patients care.  PAST MEDICAL HISTORY: 1. Medical history of hypertension. 2. History of colon cancer resection in 1998 with unknown location of    the tumor. 3. History of small bowel resection with obstruction 1972. 4. History of recent free air shown with abdominal distention and    admitted for this process. 5. Previous abdominal exploration for a possible pelvic mass on    March 30, 2001, by a  gynecologist at [facility], just    discharged home on April 02, 2001, and returned with recurrent    problems.  REVIEW OF SYSTEMS:  Otherwise noncontributory.  CURRENT MEDICATIONS: 1. Dilaudid PCA pump. 2. Unasyn IV. 3. Heparin subcu. 4. Flagyl IV. 5. TNA.  PHYSICAL EXAMINATION:  VITAL SIGNS:  The patient is orally intubated. Temperature 100, blood pressure 145/70, pulse 125, respirations 25, CVP 8, saturation 98%.  CHEST:  Course rhonchi, poor breath sounds.  CARDIOVASCULAR:  Regular rate and rhythm, S3.  ABDOMEN:  Distended. Bowel sounds hypoactive. Postop abdomen noted, colostomy site noted.  EXTREMITIES:  Cool.  NEUROLOGIC:  The patient is unconscious.  HEENT:  No jugular venous distention, lymphadenopathy. Oropharynx clear.  LABORATORY AND ACCESSORY DATA:  Earlier, potassium 4.6, creatinine 0.6, blood sugar 164.  Chest x-ray bibasilar atelectasis, slow lung volumes, pleural fluid from April 18, 2001, showed no organisms.  INR 1.6, white count 12,000, hemoglobin 9.5, platelets 464, abscesses from April 21, 2001, are pending in terms of culture. Phosphorus level 3.2.  IMPRESSION:  Recurrent abdominal abscesses, status post respiratory arrest with status post Code Blue with mucous plugging and associated severe sepsis. Now on mechanical ventilation with acute respiratory failure, status post previous laparotomy and previous percutaneous tube placements for abdominal abscesses with previous gynecologic procedure for pelvic mass in December 2002.  RECOMMENDATIONS: 1. Expand antibiotic regimen to Primaxin, vancomycin. 2. Follow up CNS data. 3. Maintain full ventilator support. 4. Follow up CNS with abscess drained. 5. Check DIC panel. 6. Check CKMBs. 7. Check troponin. 8. Doubt pulmonary embolism but would maintain gastrointestinal and deep    venous thrombosis prophylaxis.  Prognosis here is very poor. Dictated by:   Charlcie Cradle Delford Field, M.D.  [facility] Attending Physician:  Michaele Offer DD:  04/22/01 TD:  04/22/01 Job: [id number]

## 2010-08-31 NOTE — Consult Note (Signed)
[facility]  Patient:    [name], [name] Visit Number: [id number] MRN: [id number]          Service Type: MED Location: [facility] Attending Physician:  Michaele Offer Dictated by:   Sandria Bales. Ezzard Standing, M.D. Proc. Date: 04/12/01 Admit Date:  04/12/2001   CC:         Zenaida Niece, M.D.  Titus Dubin. Alwyn Ren, M.D. [facility]   Consultation Report  REASON FOR CONSULTATION:  Perforated bowel.  HISTORY OF PRESENT ILLNESS:  [name] is a pleasant 77 year old white female who was in otherwise good health but on a routine physical examination was found to have a pelvic mass in about October 2002.  This was found, I think, on physical examination and ultrasound.  Underwent laparoscopy converted to an open laparotomy on March 30, 2001 at [facility]. She was discharged four days later and apparently no pelvic mass was ever identified.  Since going home she has not really felt good and in the last two or three days has had worsening pain, diarrhea, and abdominal distention.  She now presents tonight at [facility] Emergency Room where she has an elevated white blood count of 18,200 and a KUB shows significant free air.  Prior GI history:  She has no evidence of peptic ulcer disease, liver disease, pancreatic disease.  She had a prior colon resection by Dr. Joanne Gavel in 1998. They are not sure of where this was.  She has no further treatment.  This was apparently not a colon cancer.  ALLERGIES:  None.  MEDICATIONS:  Blood pressure medicine of which name she is not sure of and Centrum.  REVIEW OF SYSTEMS:  PULMONARY:  No history of pneumonia or tuberculosis. CARDIAC:  She has had no chest pain.  She has had hypertension for a number of years.  No heart attack, chest pain, cardiac evaluation.  GASTROINTESTINAL: See history of present illness.  UROLOGIC:  No evidence of kidney stones or kidney infections.  GYNECOLOGICAL:  She has had five  children and no prior gynecological procedure until this one.  Interestingly, she apparently did have a bowel obstruction about 30 years ago which would be about 1972 in which she had a small bowel which was resected at that time, so she has actually had two abdominal operations.  She is accompanied by two of her daughters in the emergency room.  PHYSICAL EXAMINATION  VITAL SIGNS:  Temperature 100.4, pulse 88.  GENERAL:  She is a well-nourished, older female who has a dry mouth.  Is wearing dentures.  HEENT:  Unremarkable.  NECK:  Supple without mass or thyromegaly.  LUNGS:  Clear to auscultation, though she has a poor deep inspiration.  CARDIAC:  Regular rate and rhythm.  BREASTS:  No obvious breast mass.  ABDOMEN:  She has a well healed midline lower incision with Steri-Strips.  She is diffusely distended, diffusely tender in her lower abdomen with guarding and rebound.  RECTAL:  Not done.  EXTREMITIES:  She has good strength in all four extremities.  NEUROLOGIC:  Grossly intact.  LABORATORIES:  White blood count 18,200, 88% neutrophils, hemoglobin 12, hematocrit 36.  Sodium 139, potassium 4.4, chloride 100, CO2 30, glucose 110.  KUB shows what appears to be massive free air and certainly is consistent with probably some bowel perforation, possibly at the time of surgery.  I spoke to the patient and her daughters about the findings.  IMPRESSION: 1. Probable bowel perforation secondary to recent surgery.  She needs    abdominal exploration.  Will possibly need bowel resection versus colostomy    and spoke to the patient about this.  Will transfer her to [facility] for surgery and postoperative care.  She will probably end up in    the unit postoperatively. 2. Hypertension. 3. Recent abdominal surgery. Dictated by:   Sandria Bales. Ezzard Standing, M.D. Attending Physician:  Michaele Offer DD:  04/12/01 TD:  04/12/01 Job: [id number]

## 2011-01-14 LAB — BASIC METABOLIC PANEL
BUN: 26 — ABNORMAL HIGH
BUN: 29 — ABNORMAL HIGH
BUN: 31 — ABNORMAL HIGH
BUN: 55 — ABNORMAL HIGH
CO2: 28
CO2: 36 — ABNORMAL HIGH
CO2: >45 — ABNORMAL HIGH
Calcium: 8.1 — ABNORMAL LOW
Calcium: 8.1 — ABNORMAL LOW
Calcium: 8.2 — ABNORMAL LOW
Calcium: 8.3 — ABNORMAL LOW
Calcium: 8.6
Chloride: 100
Creatinine, Ser: 1.5 — ABNORMAL HIGH
Creatinine, Ser: 1.57 — ABNORMAL HIGH
Creatinine, Ser: 1.73 — ABNORMAL HIGH
Creatinine, Ser: 2.61 — ABNORMAL HIGH
GFR calc Af Amer: 21 — ABNORMAL LOW
GFR calc Af Amer: 34 — ABNORMAL LOW
GFR calc non Af Amer: 28 — ABNORMAL LOW
GFR calc non Af Amer: 31 — ABNORMAL LOW
GFR calc non Af Amer: 31 — ABNORMAL LOW
Glucose, Bld: 104 — ABNORMAL HIGH
Glucose, Bld: 106 — ABNORMAL HIGH
Glucose, Bld: 118 — ABNORMAL HIGH
Glucose, Bld: 147 — ABNORMAL HIGH
Sodium: 148 — ABNORMAL HIGH

## 2011-01-14 LAB — CLOSTRIDIUM DIFFICILE EIA

## 2011-01-14 LAB — CBC
Hemoglobin: 12.7
MCHC: 32.7
Platelets: 220
RDW: 12.8

## 2011-01-14 LAB — RENAL FUNCTION PANEL
BUN: 90 — ABNORMAL HIGH
CO2: 23
Calcium: 8.2 — ABNORMAL LOW
Glucose, Bld: 151 — ABNORMAL HIGH
Phosphorus: 4.5

## 2011-01-14 LAB — MAGNESIUM
Magnesium: 1.2 — ABNORMAL LOW
Magnesium: 1.3 — ABNORMAL LOW

## 2011-01-16 LAB — BASIC METABOLIC PANEL
BUN: 118 — ABNORMAL HIGH
GFR calc Af Amer: 5 — ABNORMAL LOW
GFR calc non Af Amer: 4 — ABNORMAL LOW
Potassium: 2.7 — CL
Sodium: 127 — ABNORMAL LOW

## 2011-01-16 LAB — COMPREHENSIVE METABOLIC PANEL
Albumin: 3.4 — ABNORMAL LOW
BUN: 125 — ABNORMAL HIGH
CO2: 17 — ABNORMAL LOW
Calcium: 8.8
Chloride: 89 — ABNORMAL LOW
Creatinine, Ser: 14.78 — ABNORMAL HIGH
GFR calc non Af Amer: 2 — ABNORMAL LOW
Total Bilirubin: 0.9

## 2011-01-16 LAB — URINE CULTURE

## 2011-01-16 LAB — OVA AND PARASITE EXAMINATION: Ova and parasites: NONE SEEN

## 2011-01-16 LAB — URINALYSIS, ROUTINE W REFLEX MICROSCOPIC
Nitrite: NEGATIVE
Protein, ur: 30 — AB
Urobilinogen, UA: 0.2

## 2011-01-16 LAB — CBC
HCT: 34.8 — ABNORMAL LOW
HCT: 39.4
Platelets: 170
Platelets: 193
RBC: 3.78 — ABNORMAL LOW
RDW: 12.5
WBC: 10.6 — ABNORMAL HIGH
WBC: 5.9

## 2011-01-16 LAB — DIFFERENTIAL
Basophils Absolute: 0
Eosinophils Relative: 1
Lymphocytes Relative: 12
Lymphocytes Relative: 13
Lymphs Abs: 0.8
Lymphs Abs: 1.2
Neutro Abs: 8.2 — ABNORMAL HIGH

## 2011-01-16 LAB — CULTURE, BLOOD (ROUTINE X 2)

## 2011-01-16 LAB — CLOSTRIDIUM DIFFICILE EIA: C difficile Toxins A+B, EIA: NEGATIVE

## 2011-01-16 LAB — LIPASE, BLOOD
Lipase: 135 — ABNORMAL HIGH
Lipase: 547 — ABNORMAL HIGH

## 2011-01-16 LAB — CK TOTAL AND CKMB (NOT AT ARMC)
CK, MB: 3.7
Relative Index: INVALID

## 2011-01-16 LAB — OCCULT BLOOD X 1 CARD TO LAB, STOOL: Fecal Occult Bld: POSITIVE

## 2011-01-16 LAB — LACTIC ACID, PLASMA: Lactic Acid, Venous: 0.7

## 2011-01-16 LAB — URINE MICROSCOPIC-ADD ON

## 2011-01-16 LAB — AMYLASE: Amylase: 285 — ABNORMAL HIGH

## 2011-01-16 LAB — FECAL LACTOFERRIN, QUANT

## 2011-01-17 LAB — POCT I-STAT, CHEM 8
BUN: 17 mg/dL (ref 6–23)
Calcium, Ion: 1.06 mmol/L — ABNORMAL LOW (ref 1.12–1.32)
Creatinine, Ser: 1.2 mg/dL (ref 0.4–1.2)
TCO2: 26 mmol/L (ref 0–100)

## 2011-01-17 LAB — URINALYSIS, ROUTINE W REFLEX MICROSCOPIC
Glucose, UA: NEGATIVE mg/dL
pH: 6.5 (ref 5.0–8.0)

## 2011-01-17 LAB — CBC
HCT: 38.7 % (ref 36.0–46.0)
Hemoglobin: 12.6 g/dL (ref 12.0–15.0)
RBC: 4.13 MIL/uL (ref 3.87–5.11)
WBC: 8.3 10*3/uL (ref 4.0–10.5)

## 2011-01-17 LAB — DIFFERENTIAL
Eosinophils Relative: 2 % (ref 0–5)
Lymphocytes Relative: 29 % (ref 12–46)
Lymphs Abs: 2.4 10*3/uL (ref 0.7–4.0)
Monocytes Absolute: 0.5 10*3/uL (ref 0.1–1.0)

## 2011-01-18 ENCOUNTER — Ambulatory Visit (INDEPENDENT_AMBULATORY_CARE_PROVIDER_SITE_OTHER): Payer: Medicare Other

## 2011-01-18 DIAGNOSIS — Z23 Encounter for immunization: Secondary | ICD-10-CM

## 2011-01-25 LAB — CBC
HCT: 32 — ABNORMAL LOW
HCT: 32.5 — ABNORMAL LOW
Hemoglobin: 10.9 — ABNORMAL LOW
MCHC: 33.8
MCHC: 34.2
Platelets: 117 — ABNORMAL LOW
RBC: 3.49 — ABNORMAL LOW
RDW: 13
RDW: 13.7

## 2011-01-25 LAB — BASIC METABOLIC PANEL
BUN: 5 — ABNORMAL LOW
CO2: 26
CO2: 29
Calcium: 8.5
Chloride: 113 — ABNORMAL HIGH
GFR calc non Af Amer: 55 — ABNORMAL LOW
Glucose, Bld: 106 — ABNORMAL HIGH
Glucose, Bld: 95
Potassium: 2.9 — ABNORMAL LOW
Sodium: 146 — ABNORMAL HIGH

## 2011-01-25 LAB — I-STAT 8, (EC8 V) (CONVERTED LAB)
Acid-Base Excess: 1
Bicarbonate: 25.8 — ABNORMAL HIGH
Glucose, Bld: 103 — ABNORMAL HIGH
Potassium: 4.7
TCO2: 27
pCO2, Ven: 39.9 — ABNORMAL LOW
pH, Ven: 7.418 — ABNORMAL HIGH

## 2011-01-28 LAB — BASIC METABOLIC PANEL
BUN: 12
BUN: 13
Chloride: 106
Chloride: 108
Creatinine, Ser: 1.26 — ABNORMAL HIGH
GFR calc non Af Amer: 32 — ABNORMAL LOW
GFR calc non Af Amer: 40 — ABNORMAL LOW
Glucose, Bld: 119 — ABNORMAL HIGH
Glucose, Bld: 133 — ABNORMAL HIGH
Potassium: 4
Potassium: 4
Sodium: 140

## 2011-01-28 LAB — URINALYSIS, ROUTINE W REFLEX MICROSCOPIC
Bilirubin Urine: NEGATIVE
Ketones, ur: NEGATIVE
Nitrite: POSITIVE — AB
Urobilinogen, UA: 0.2

## 2011-01-28 LAB — CBC
HCT: 31.6 — ABNORMAL LOW
HCT: 34.4 — ABNORMAL LOW
Hemoglobin: 11.7 — ABNORMAL LOW
Hemoglobin: 14.3
MCV: 90.2
MCV: 91.2
MCV: 92.6
Platelets: 121 — ABNORMAL LOW
Platelets: 132 — ABNORMAL LOW
RBC: 4.65
RDW: 13.3
RDW: 13.6
WBC: 10.2
WBC: 23.4 — ABNORMAL HIGH

## 2011-01-28 LAB — DIFFERENTIAL
Eosinophils Absolute: 0
Lymphs Abs: 1
Monocytes Absolute: 0 — ABNORMAL LOW
Monocytes Relative: 0 — ABNORMAL LOW
Neutrophils Relative %: 89 — ABNORMAL HIGH

## 2011-01-28 LAB — I-STAT 8, (EC8 V) (CONVERTED LAB)
Acid-Base Excess: 1
Bicarbonate: 29 — ABNORMAL HIGH
Operator id: 285491
TCO2: 31
pCO2, Ven: 58.5 — ABNORMAL HIGH
pH, Ven: 7.303 — ABNORMAL HIGH

## 2011-01-28 LAB — CULTURE, BLOOD (ROUTINE X 2)

## 2011-01-28 LAB — URINE CULTURE
Colony Count: >=100000
Colony Count: NO GROWTH
Culture: NO GROWTH
Special Requests: POSITIVE

## 2011-04-12 ENCOUNTER — Encounter: Payer: Self-pay | Admitting: Internal Medicine

## 2011-04-12 ENCOUNTER — Encounter: Payer: Self-pay | Admitting: *Deleted

## 2011-04-23 ENCOUNTER — Encounter: Payer: Self-pay | Admitting: Internal Medicine

## 2011-04-23 ENCOUNTER — Other Ambulatory Visit: Payer: Self-pay

## 2011-04-23 ENCOUNTER — Ambulatory Visit (INDEPENDENT_AMBULATORY_CARE_PROVIDER_SITE_OTHER): Payer: Medicare Other | Admitting: Internal Medicine

## 2011-04-23 DIAGNOSIS — Z87442 Personal history of urinary calculi: Secondary | ICD-10-CM

## 2011-04-23 DIAGNOSIS — Z433 Encounter for attention to colostomy: Secondary | ICD-10-CM

## 2011-04-23 DIAGNOSIS — C189 Malignant neoplasm of colon, unspecified: Secondary | ICD-10-CM

## 2011-04-23 DIAGNOSIS — M48061 Spinal stenosis, lumbar region without neurogenic claudication: Secondary | ICD-10-CM

## 2011-04-23 DIAGNOSIS — I10 Essential (primary) hypertension: Secondary | ICD-10-CM

## 2011-04-23 DIAGNOSIS — E785 Hyperlipidemia, unspecified: Secondary | ICD-10-CM

## 2011-04-23 LAB — LDL CHOLESTEROL, DIRECT: Direct LDL: 102.8 mg/dL

## 2011-04-23 LAB — LIPID PANEL: Triglycerides: 255 mg/dL — ABNORMAL HIGH (ref 0.0–149.0)

## 2011-04-23 LAB — CBC WITH DIFFERENTIAL/PLATELET
Eosinophils Relative: 2.4 % (ref 0.0–5.0)
Monocytes Relative: 8 % (ref 3.0–12.0)
Neutrophils Relative %: 47.9 % (ref 43.0–77.0)
Platelets: 192 10*3/uL (ref 150.0–400.0)
WBC: 6.4 10*3/uL (ref 4.5–10.5)

## 2011-04-23 LAB — COMPREHENSIVE METABOLIC PANEL
Albumin: 3.6 g/dL (ref 3.5–5.2)
CO2: 29 mEq/L (ref 19–32)
GFR: 33.96 mL/min — ABNORMAL LOW (ref >60.00)
Glucose, Bld: 108 mg/dL — ABNORMAL HIGH (ref 70–99)
Potassium: 5.1 mEq/L (ref 3.5–5.1)
Sodium: 146 mEq/L — ABNORMAL HIGH (ref 135–145)
Total Protein: 6.9 g/dL (ref 6.0–8.3)

## 2011-04-23 MED ORDER — METOPROLOL TARTRATE 50 MG PO TABS: 50.0000 mg | ORAL_TABLET | Freq: Two times a day (BID) | ORAL | Status: DC

## 2011-04-23 MED ORDER — COLOPLAST DRAINABLE POUCH MISC: 1.0000 | Status: DC | PRN

## 2011-04-23 MED ORDER — METOPROLOL TARTRATE 50 MG PO TABS: 25.0000 mg | ORAL_TABLET | Freq: Two times a day (BID) | ORAL | Status: DC

## 2011-04-23 MED ORDER — AMLODIPINE BESYLATE 5 MG PO TABS: 5.0000 mg | ORAL_TABLET | Freq: Every day | ORAL | Status: DC

## 2011-04-23 MED ORDER — TRAMADOL HCL 50 MG PO TABS: 50.0000 mg | ORAL_TABLET | Freq: Four times a day (QID) | ORAL | Status: DC | PRN

## 2011-04-23 NOTE — Telephone Encounter (Signed)
drections corrected and resent to [employer]

## 2011-04-23 NOTE — Progress Notes (Signed)
Subjective:    Patient ID: [name], female    DOB: 1919/09/22, 78 y.o.   MRN: [id number]  HPI  78 year old patient who is seen today for a wellness exam. She has remote history of colon carcinoma in 2008. She did have a followup colonoscopy in 2009. She was last noted to the hospital in the spring of last year due to a left-sided renal colic she has a functioning colostomy. She does remarkably well at 89. No real concerns or complaints today. She does have some back pain which does well with when necessary tramadol. She has treated hypertension which has done well on treatment. She has a prior history of dyslipidemia but apparently no longer takes a statin.  1. Risk factors, based on past  M,S,F history- cardiovascular risk factors include age hypertension and dyslipidemia  2.  Physical activities: Limited due to her age arthritis.  3.  Depression/mood: No history of depression or mood disorder  4.  Hearing: No significant deficits  5.  ADL's: Lives with her daughter. Fairly independent and aspects of daily living  6.  Fall risk: moderate due to age and arthritis  7.  Home safety: No problems identified  8.  Height weight, and visual acuity; height and weight stable no change in visual acuity  9.  Counseling: Heart healthy diet more regular exercise encouraged  10. Lab orders based on risk factors: Laboratory profile will be reviewed  11. Referral : Not appropriate at this time  12. Care plan: Continue present regimen medications refilled  13. Cognitive assessment: Alert and oriented with normal affect. No cognitive dysfunction.      Review of Systems  Constitutional: Negative.   HENT: Negative for hearing loss, congestion, sore throat, rhinorrhea, dental problem, sinus pressure and tinnitus.   Eyes: Negative for pain, discharge and visual disturbance.  Respiratory: Negative for cough and shortness of breath.   Cardiovascular: Negative for chest pain, palpitations  and leg swelling.  Gastrointestinal: Negative for nausea, vomiting, abdominal pain, diarrhea, constipation, blood in stool and abdominal distention.  Genitourinary: Negative for dysuria, urgency, frequency, hematuria, flank pain, vaginal bleeding, vaginal discharge, difficulty urinating, vaginal pain and pelvic pain.  Musculoskeletal: Positive for back pain and gait problem. Negative for joint swelling and arthralgias.  Skin: Negative for rash.  Neurological: Negative for dizziness, syncope, speech difficulty, weakness, numbness and headaches.  Hematological: Negative for adenopathy.  Psychiatric/Behavioral: Negative for behavioral problems, dysphoric mood and agitation. The patient is not nervous/anxious.        Objective:   Physical Exam  Constitutional: She is oriented to person, place, and time. She appears well-developed and well-nourished.  HENT:  Head: Normocephalic and atraumatic.  Right Ear: External ear normal.  Left Ear: External ear normal.  Mouth/Throat: Oropharynx is clear and moist.       Dentures in place  Eyes: Conjunctivae and EOM are normal.  Neck: Normal range of motion. Neck supple. No JVD present. No thyromegaly present.  Cardiovascular: Normal rate, regular rhythm and normal heart sounds.   No murmur heard.      Pedal pulses nonpalpable but no ischemic changes noted  Pulmonary/Chest: Effort normal and breath sounds normal. She has no wheezes. She has no rales.  Abdominal: Soft. Bowel sounds are normal. She exhibits no distension and no mass. There is no tenderness. There is no rebound and no guarding.       Midline surgical scar Colostomy right midabdominal wall  Genitourinary: Vagina normal.  Musculoskeletal: Normal range of motion.  She exhibits no edema and no tenderness.  Neurological: She is alert and oriented to person, place, and time. She has normal reflexes. No cranial nerve deficit. She exhibits normal muscle tone. Coordination normal.       Unsteady  gait  Mild buccal lingual dyskinesia  Skin: Skin is warm and dry. No rash noted.  Psychiatric: She has a normal mood and affect. Her behavior is normal.          Assessment & Plan:   Preventive health examination Remote colon carcinoma Dyslipidemia. We'll check a lipid profile consider resuming statin therapy Hypertension well controlled Osteoarthritis with chronic back pain. Tramadol refill  Recheck in one year or as needed

## 2011-04-23 NOTE — Patient Instructions (Signed)
Limit your sodium (Salt) intake  Please check your blood pressure on a regular basis.  If it is consistently greater than 150/90, please make an office appointment.  Return in one year for follow-up  

## 2011-04-25 ENCOUNTER — Other Ambulatory Visit: Payer: Self-pay

## 2011-04-25 MED ORDER — METOPROLOL TARTRATE 50 MG PO TABS: 25.0000 mg | ORAL_TABLET | Freq: Two times a day (BID) | ORAL | Status: DC

## 2011-04-25 NOTE — Telephone Encounter (Signed)
Re sent rx - 25mg  bid

## 2011-08-23 ENCOUNTER — Telehealth: Payer: Self-pay | Admitting: Internal Medicine

## 2011-08-23 DIAGNOSIS — Z433 Encounter for attention to colostomy: Secondary | ICD-10-CM

## 2011-08-23 NOTE — Telephone Encounter (Signed)
Pt is needing to get a script for Coloscopy bags. Pts supplier is no longer on pts insurance. Need to get a new script faxed to [facility]. Fax # [phone].

## 2011-08-26 MED ORDER — COLOPLAST DRAINABLE POUCH MISC: 1.0000 | Status: DC | PRN

## 2011-08-26 NOTE — Telephone Encounter (Signed)
Order faxed  patricia aware

## 2011-09-06 ENCOUNTER — Telehealth: Payer: Self-pay | Admitting: Internal Medicine

## 2011-09-06 DIAGNOSIS — Z433 Encounter for attention to colostomy: Secondary | ICD-10-CM

## 2011-09-06 MED ORDER — COLOPLAST DRAINABLE POUCH MISC: 1.0000 | Status: AC | PRN

## 2011-09-06 NOTE — Telephone Encounter (Signed)
Patient's daughter called stating that patient is out of her colostomy bags and an rx was faxed over and they did not fill it. [facility] advised her to have the MD fax a rx for colostomy bags to [phone]. Please assist.

## 2011-09-06 NOTE — Telephone Encounter (Signed)
faxed

## 2011-09-18 ENCOUNTER — Encounter: Payer: Self-pay | Admitting: Internal Medicine

## 2011-09-18 ENCOUNTER — Ambulatory Visit (INDEPENDENT_AMBULATORY_CARE_PROVIDER_SITE_OTHER): Payer: Medicare Other | Admitting: Internal Medicine

## 2011-09-18 VITALS — BP 124/80 | Temp 97.8°F | Wt 183.0 lb

## 2011-09-18 DIAGNOSIS — I10 Essential (primary) hypertension: Secondary | ICD-10-CM

## 2011-09-18 DIAGNOSIS — E785 Hyperlipidemia, unspecified: Secondary | ICD-10-CM

## 2011-09-18 DIAGNOSIS — M48061 Spinal stenosis, lumbar region without neurogenic claudication: Secondary | ICD-10-CM

## 2011-09-18 NOTE — Progress Notes (Signed)
  Subjective:    Patient ID: [name], female    DOB: 11-Jun-1919, 78 y.o.   MRN: [id number]  HPI  78 year old patient who is seen today for her biannual followup. She has a history of treated hypertension she has mild dyslipidemia. She has arthritis and a history of mild renal insufficiency. Did have laboratory studies done 5 months ago which were stable. No major concerns or complaints today her arthritis is stable   Review of Systems  Constitutional: Negative.   HENT: Negative for hearing loss, congestion, sore throat, rhinorrhea, dental problem, sinus pressure and tinnitus.   Eyes: Negative for pain, discharge and visual disturbance.  Respiratory: Negative for cough and shortness of breath.   Cardiovascular: Negative for chest pain, palpitations and leg swelling.  Gastrointestinal: Negative for nausea, vomiting, abdominal pain, diarrhea, constipation, blood in stool and abdominal distention.  Genitourinary: Negative for dysuria, urgency, frequency, hematuria, flank pain, vaginal bleeding, vaginal discharge, difficulty urinating, vaginal pain and pelvic pain.  Musculoskeletal: Negative for joint swelling, arthralgias and gait problem.  Skin: Negative for rash.  Neurological: Negative for dizziness, syncope, speech difficulty, weakness, numbness and headaches.  Hematological: Negative for adenopathy.  Psychiatric/Behavioral: Negative for behavioral problems, dysphoric mood and agitation. The patient is not nervous/anxious.        Objective:   Physical Exam  Constitutional: She is oriented to person, place, and time. She appears well-developed and well-nourished.  HENT:  Head: Normocephalic.  Right Ear: External ear normal.  Left Ear: External ear normal.  Mouth/Throat: Oropharynx is clear and moist.  Eyes: Conjunctivae and EOM are normal. Pupils are equal, round, and reactive to light.  Neck: Normal range of motion. Neck supple. No thyromegaly present.  Cardiovascular:  Normal rate, regular rhythm and normal heart sounds.   Pulmonary/Chest: Effort normal and breath sounds normal.  Abdominal: Soft. Bowel sounds are normal. She exhibits no mass. There is no tenderness.  Musculoskeletal: Normal range of motion.  Lymphadenopathy:    She has no cervical adenopathy.  Neurological: She is alert and oriented to person, place, and time.  Skin: Skin is warm and dry. No rash noted.  Psychiatric: She has a normal mood and affect. Her behavior is normal.          Assessment & Plan:   Hypertension well controlled Osteoarthritis Mild dyslipidemia  Low-salt diet recommended Increase activity recommended  Medicines unchanged Recheck 6 months

## 2011-09-18 NOTE — Patient Instructions (Signed)
Limit your sodium (Salt) intake    It is important that you exercise regularly, at least 20 minutes 3 to 4 times per week.  If you develop chest pain or shortness of breath seek  medical attention.  Return in 6 months for follow-up  

## 2011-12-18 ENCOUNTER — Telehealth: Payer: Self-pay | Admitting: Internal Medicine

## 2011-12-18 NOTE — Telephone Encounter (Signed)
Spoke with pt- discuss Bp - numbers are good - no other sx noted - on meds , feeling good - ok to cx appt - but she can call if she has concerns and I will talk with her and make appt if needed.

## 2011-12-18 NOTE — Telephone Encounter (Signed)
Pt tried calling triage, but said that she could not get anyone. Pt said that her bp was 125/79 this morning. Pt has schd ov with Dr Amador Cunas on Friday 12/20/11 at 10:30am. Pt req call back from [facility].

## 2011-12-20 ENCOUNTER — Ambulatory Visit: Payer: Medicare Other | Admitting: Internal Medicine

## 2012-01-10 ENCOUNTER — Ambulatory Visit (INDEPENDENT_AMBULATORY_CARE_PROVIDER_SITE_OTHER): Payer: Medicare Other

## 2012-01-10 DIAGNOSIS — Z23 Encounter for immunization: Secondary | ICD-10-CM

## 2012-06-26 ENCOUNTER — Encounter: Payer: Self-pay | Admitting: Internal Medicine

## 2012-06-26 ENCOUNTER — Ambulatory Visit (INDEPENDENT_AMBULATORY_CARE_PROVIDER_SITE_OTHER): Payer: Medicare Other | Admitting: Internal Medicine

## 2012-06-26 VITALS — BP 130/80 | HR 83 | Temp 97.8°F | Resp 20 | Wt 183.0 lb

## 2012-06-26 DIAGNOSIS — I10 Essential (primary) hypertension: Secondary | ICD-10-CM

## 2012-06-26 MED ORDER — TRAMADOL HCL 50 MG PO TABS: 50.0000 mg | ORAL_TABLET | Freq: Four times a day (QID) | ORAL | Status: DC | PRN

## 2012-06-26 MED ORDER — METOPROLOL TARTRATE 50 MG PO TABS: 25.0000 mg | ORAL_TABLET | Freq: Two times a day (BID) | ORAL | Status: DC

## 2012-06-26 MED ORDER — TRIAMCINOLONE ACETONIDE 0.1 % EX CREA: TOPICAL_CREAM | Freq: Two times a day (BID) | CUTANEOUS | Status: DC

## 2012-06-26 MED ORDER — AMLODIPINE BESYLATE 5 MG PO TABS: 5.0000 mg | ORAL_TABLET | Freq: Every day | ORAL | Status: DC

## 2012-06-26 NOTE — Patient Instructions (Signed)
Call or return to clinic prn if these symptoms worsen or fail to improve as anticipated.  Limit your sodium (Salt) intake   

## 2012-06-26 NOTE — Progress Notes (Signed)
Subjective:    Patient ID: [name], female    DOB: [birth year], 79 y.o.   MRN: [id number]  HPI  79 year old patient who presents with a two-day history of a rash involving the right palm ulnar aspect near the wrist; she also has noticed some pain involving her fourth finger. Otherwise feels well. No history of similar rash. No prior history of shingles.  Past Medical History  Diagnosis Date  . CARCINOMA, COLON 02/03/2007  . HYPERLIPIDEMIA 06/19/2007  . Unspecified essential hypertension 02/03/2007  . HYPOTENSION, ORTHOSTATIC 12/24/2007  . PANCREATITIS, ACUTE 12/27/2007  . RENAL FAILURE, ACUTE 12/27/2007  . KIDNEY STONE 02/04/2007  . 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 Lumbago 03/13/2007  . SYNCOPE 03/13/2007  . Dysuria 02/04/2007  . NEPHROLITHIASIS, HX OF 06/19/2007  . LEG PAIN, LEFT 05/14/2010    History   Social History  . Marital Status: Married    Spouse Name: N/A    Number of Children: N/A  . Years of Education: N/A   Occupational History  . Not on file.   Social History Main Topics  . Smoking status: Never Smoker   . Smokeless tobacco: Never Used  . Alcohol Use: No  . Drug Use: No  . Sexually Active: Not on file   Other Topics Concern  . Not on file   Social History Narrative  . No narrative on file    Past Surgical History  Procedure Laterality Date  . Colon surgery  2002  . Cystoscopy      Family History  Problem Relation Age of Onset  . Cirrhosis Mother 64    nonalcholic  . Leukemia Father     Allergies  Allergen Reactions  . Amoxicillin-Pot Clavulanate     REACTION: hives    Current Outpatient Prescriptions on File Prior to Visit  Medication Sig Dispense Refill  . amLODipine (NORVASC) 5 MG tablet Take 1 tablet (5 mg total) by mouth daily.  90 tablet  6  . calcium-vitamin D (OSCAL WITH D) 500-200 MG-UNIT per tablet Take 1 tablet by mouth daily.        . metoprolol (LOPRESSOR) 50 MG tablet Take 0.5 tablets (25 mg total) by mouth 2  (two) times daily.  90 tablet  3  . Omega-3 Fatty Acids (FISH OIL) 1200 MG CAPS Take by mouth daily.        . Ostomy Supplies (COLOPLAST DRAINABLE POUCH) POUCH MISC 1 each by Does not apply route as needed.  20 each  3  . traMADol (ULTRAM) 50 MG tablet Take 1 tablet (50 mg total) by mouth every 6 (six) hours as needed. Maximum dose= 8 tablets per day  90 tablet  4   No current facility-administered medications on file prior to visit.    BP 130/80  Pulse 83  Temp(Src) 97.8 F (36.6 C) (Oral)  Resp 20  Wt 183 lb (83.008 kg)  BMI 34.02 kg/m2  SpO2 95%       Review of Systems  Skin: Positive for rash.       Objective:   Physical Exam  Constitutional: She appears well-developed and well-nourished. No distress.  Skin:  3 cm region of patchy erythema involving the palm ulnar aspect just distal to the wrist.          Assessment & Plan:   Nonspecific dermatitis right hand.  Shingles seems to be a remote possibility but a consideration since there seems to be referred pain to the right fourth finger.  We'll treat  with triamcinolone and observe. Will refill Ultram for pain Hypertension stable

## 2012-07-12 ENCOUNTER — Other Ambulatory Visit: Payer: Self-pay | Admitting: Internal Medicine

## 2012-07-16 ENCOUNTER — Other Ambulatory Visit: Payer: Self-pay | Admitting: Internal Medicine

## 2012-09-23 ENCOUNTER — Ambulatory Visit (INDEPENDENT_AMBULATORY_CARE_PROVIDER_SITE_OTHER): Payer: Medicare Other | Admitting: Internal Medicine

## 2012-09-23 ENCOUNTER — Encounter: Payer: Self-pay | Admitting: Internal Medicine

## 2012-09-23 VITALS — BP 160/90 | HR 65 | Temp 97.7°F | Resp 20 | Ht 62.5 in | Wt 182.0 lb

## 2012-09-23 DIAGNOSIS — I951 Orthostatic hypotension: Secondary | ICD-10-CM

## 2012-09-23 DIAGNOSIS — Z0000 Encounter for general adult medical examination without abnormal findings: Secondary | ICD-10-CM

## 2012-09-23 DIAGNOSIS — C189 Malignant neoplasm of colon, unspecified: Secondary | ICD-10-CM

## 2012-09-23 DIAGNOSIS — Z87442 Personal history of urinary calculi: Secondary | ICD-10-CM

## 2012-09-23 DIAGNOSIS — E785 Hyperlipidemia, unspecified: Secondary | ICD-10-CM

## 2012-09-23 DIAGNOSIS — I10 Essential (primary) hypertension: Secondary | ICD-10-CM

## 2012-09-23 LAB — COMPREHENSIVE METABOLIC PANEL
ALT: 21 U/L (ref 0–35)
AST: 23 U/L (ref 0–37)
Calcium: 9.7 mg/dL (ref 8.4–10.5)
Chloride: 106 mEq/L (ref 96–112)
Creatinine, Ser: 1.3 mg/dL — ABNORMAL HIGH (ref 0.4–1.2)
Sodium: 142 mEq/L (ref 135–145)
Total Bilirubin: 0.9 mg/dL (ref 0.3–1.2)

## 2012-09-23 LAB — CBC WITH DIFFERENTIAL/PLATELET
Basophils Absolute: 0.1 10*3/uL (ref 0.0–0.1)
Eosinophils Absolute: 0.2 10*3/uL (ref 0.0–0.7)
HCT: 41.8 % (ref 36.0–46.0)
Hemoglobin: 13.9 g/dL (ref 12.0–15.0)
Lymphs Abs: 3.2 10*3/uL (ref 0.7–4.0)
MCHC: 33.4 g/dL (ref 30.0–36.0)
Neutro Abs: 3.6 10*3/uL (ref 1.4–7.7)
RDW: 13.5 % (ref 11.5–14.6)

## 2012-09-23 LAB — TSH: TSH: 2.73 u[IU]/mL (ref 0.35–5.50)

## 2012-09-23 MED ORDER — TRIAMCINOLONE ACETONIDE 0.1 % EX CREA: TOPICAL_CREAM | CUTANEOUS | Status: DC

## 2012-09-23 NOTE — Progress Notes (Signed)
Patient ID: [name], female   DOB: [birth year], 79 y.o.   MRN: [id number]  Subjective:    Patient ID: [name], female    DOB: [birth year], 79 y.o.   MRN: [id number]  HPI 22 -year-old patient who is seen today for a wellness exam. She has remote history of colon carcinoma in 2008. She did have a followup colonoscopy in 2009. She was last noted to the hospital in the spring of last year due to a left-sided renal colic she has a functioning colostomy. She does remarkably well at 89. No real concerns or complaints today. She does have some back pain which does well with when necessary tramadol. She has treated hypertension which has done well on treatment. She has a prior history of dyslipidemia but apparently no longer takes a statin.  1. Risk factors, based on past  M,S,F history- cardiovascular risk factors include age hypertension and dyslipidemia  2.  Physical activities: Limited due to her age arthritis.  3.  Depression/mood: No history of depression or mood disorder  4.  Hearing: No significant deficits  5.  ADL's: Lives with her daughter. Fairly independent and aspects of daily living  6.  Fall risk: moderate due to age and arthritis  7.  Home safety: No problems identified  8.  Height weight, and visual acuity; height and weight stable no change in visual acuity  9.  Counseling: Heart healthy diet more regular exercise encouraged  10. Lab orders based on risk factors: Laboratory profile will be reviewed  11. Referral : Not appropriate at this time  12. Care plan: Continue present regimen medications refilled  13. Cognitive assessment: Alert and oriented with normal affect. No cognitive dysfunction.      Review of Systems  Constitutional: Negative.   HENT: Negative for hearing loss, congestion, sore throat, rhinorrhea, dental problem, sinus pressure and tinnitus.   Eyes: Negative for pain, discharge and visual disturbance.  Respiratory: Negative for  cough and shortness of breath.   Cardiovascular: Negative for chest pain, palpitations and leg swelling.  Gastrointestinal: Negative for nausea, vomiting, abdominal pain, diarrhea, constipation, blood in stool and abdominal distention.  Genitourinary: Negative for dysuria, urgency, frequency, hematuria, flank pain, vaginal bleeding, vaginal discharge, difficulty urinating, vaginal pain and pelvic pain.  Musculoskeletal: Positive for back pain and gait problem. Negative for joint swelling and arthralgias.  Skin: Negative for rash.  Neurological: Negative for dizziness, syncope, speech difficulty, weakness, numbness and headaches.  Hematological: Negative for adenopathy.  Psychiatric/Behavioral: Negative for behavioral problems, dysphoric mood and agitation. The patient is not nervous/anxious.        Objective:   Physical Exam  Constitutional: She is oriented to person, place, and time. She appears well-developed and well-nourished.  HENT:  Head: Normocephalic and atraumatic.  Right Ear: External ear normal.  Left Ear: External ear normal.  Mouth/Throat: Oropharynx is clear and moist.  Dentures in place  Eyes: Conjunctivae and EOM are normal.  Neck: Normal range of motion. Neck supple. No JVD present. No thyromegaly present.  Cardiovascular: Normal rate, regular rhythm and normal heart sounds.   No murmur heard. Dorsalis pedis pulses palpable. Posterior tibial pulses nonpalpable. Trace edema  Pulmonary/Chest: Effort normal and breath sounds normal. She has no wheezes. She has no rales.  Abdominal: Soft. Bowel sounds are normal. She exhibits no distension and no mass. There is no tenderness. There is no rebound and no guarding.  Midline surgical scar Colostomy right midabdominal wall  Genitourinary: Vagina normal.  Musculoskeletal:  Normal range of motion. She exhibits no edema and no tenderness.  Neurological: She is alert and oriented to person, place, and time. She has normal reflexes.  No cranial nerve deficit. She exhibits normal muscle tone. Coordination normal.  Unsteady gait  Mild buccal lingual dyskinesia  Skin: Skin is warm and dry. No rash noted.  Psychiatric: She has a normal mood and affect. Her behavior is normal.          Assessment & Plan:   Preventive health examination Remote colon carcinoma Dyslipidemia.  Hypertension well controlled Osteoarthritis with chronic back pain. Tramadol refill  Recheck in one year or as needed

## 2012-09-23 NOTE — Patient Instructions (Signed)
Limit your sodium (Salt) intake  Please check your blood pressure on a regular basis.  If it is consistently greater than 150/90, please make an office appointment.  Return in 6 months for follow-up   

## 2012-10-05 ENCOUNTER — Encounter: Payer: Self-pay | Admitting: Internal Medicine

## 2013-01-20 ENCOUNTER — Ambulatory Visit (INDEPENDENT_AMBULATORY_CARE_PROVIDER_SITE_OTHER): Payer: Medicare Other

## 2013-01-20 DIAGNOSIS — Z23 Encounter for immunization: Secondary | ICD-10-CM

## 2013-02-17 ENCOUNTER — Telehealth: Payer: Self-pay | Admitting: Internal Medicine

## 2013-02-17 ENCOUNTER — Encounter: Payer: Self-pay | Admitting: Family Medicine

## 2013-02-17 ENCOUNTER — Ambulatory Visit (INDEPENDENT_AMBULATORY_CARE_PROVIDER_SITE_OTHER): Payer: Medicare Other | Admitting: Family Medicine

## 2013-02-17 VITALS — BP 168/80 | HR 98 | Temp 98.0°F | Wt 178.0 lb

## 2013-02-17 DIAGNOSIS — I10 Essential (primary) hypertension: Secondary | ICD-10-CM

## 2013-02-17 MED ORDER — HYDROCHLOROTHIAZIDE 25 MG PO TABS: 25.0000 mg | ORAL_TABLET | Freq: Every day | ORAL | Status: DC

## 2013-02-17 NOTE — Progress Notes (Signed)
  Subjective:    Patient ID: [name], female    DOB: 08-09-1919, 79 y.o.   MRN: [id number]  HPI Here for some recent swelling in the hands and feet, and her BP has been as high as 170/90. This was 160/90 at her last visit in June. She denies any chest pain or SOB.    Review of Systems  Constitutional: Negative.   Respiratory: Negative.   Cardiovascular: Positive for leg swelling. Negative for chest pain and palpitations.       Objective:   Physical Exam  Constitutional: She appears well-developed and well-nourished. No distress.  Cardiovascular: Normal rate, regular rhythm, normal heart sounds and intact distal pulses.   No murmur heard. Pulmonary/Chest: Effort normal and breath sounds normal. No respiratory distress. She has no wheezes. She has no rales.  Musculoskeletal:  2+ edema in both feet, the hands have no edema           Assessment & Plan:  HTN with some fluid accumulation. Add HCTZ to her daily regimen. Recheck with Dr. Amador Cunas next month

## 2013-02-17 NOTE — Telephone Encounter (Signed)
Patient Information:  Caller Name: Jaci  Phone: [phone]  Patient: [name]  Gender: Female  DOB: 06-15-1919  Age: 79 Years  PCP: Eleonore Chiquito (Family Practice > 76yrs old)  Office Follow Up:  Does the office need to follow up with this patient?: No  Instructions For The Office: N/A   Symptoms  Reason For Call & Symptoms: BP= 170/90 on11/4/14. She has misplaced BP Cuff and cannot check it today-02/17/13. She has been getting headaches on and off for past week. She has occasional dry cough and sometimes feels unsteady on her feet. Hx Syncopy and Ortho HTN. Advsied to be checked within 2 weeks for BP > 160/90 and need for possible medication adjustment. She has appointment already scheduled for today 02/17/13 @ 1315 with Dr. Clent Ridges.  Reviewed Health History In EMR: Yes  Reviewed Medications In EMR: Yes  Reviewed Allergies In EMR: Yes  Reviewed Surgeries / Procedures: Yes  Date of Onset of Symptoms: 02/10/2013  Guideline(s) Used:  High Blood Pressure  Disposition Per Guideline:   See Within 2 Weeks in Office  Reason For Disposition Reached:   BP > 160/100  Advice Given:  Call Back If:  Headache, blurred vision, difficulty talking, or difficulty walking occurs  Chest pain or difficulty breathing occurs  You want to go in to the office for a blood pressure check  You become worse.  Patient Will Follow Care Advice:  YES  Appointment Scheduled:  02/17/2013 13:15:00 Appointment Scheduled Provider:  Gershon Crane [facility])

## 2013-03-23 ENCOUNTER — Encounter: Payer: Self-pay | Admitting: Internal Medicine

## 2013-03-23 ENCOUNTER — Ambulatory Visit (INDEPENDENT_AMBULATORY_CARE_PROVIDER_SITE_OTHER): Payer: Medicare Other | Admitting: Internal Medicine

## 2013-03-23 VITALS — BP 142/80 | HR 58 | Temp 97.7°F | Resp 20 | Wt 179.0 lb

## 2013-03-23 DIAGNOSIS — I10 Essential (primary) hypertension: Secondary | ICD-10-CM

## 2013-03-23 DIAGNOSIS — Z23 Encounter for immunization: Secondary | ICD-10-CM

## 2013-03-23 DIAGNOSIS — E785 Hyperlipidemia, unspecified: Secondary | ICD-10-CM

## 2013-03-23 MED ORDER — HYDROCHLOROTHIAZIDE 25 MG PO TABS: 25.0000 mg | ORAL_TABLET | Freq: Every day | ORAL | Status: DC

## 2013-03-23 MED ORDER — TRIAMCINOLONE ACETONIDE 0.1 % EX CREA: TOPICAL_CREAM | CUTANEOUS | Status: DC

## 2013-03-23 NOTE — Progress Notes (Signed)
Pre-visit discussion using our clinic review tool. No additional management support is needed unless otherwise documented below in the visit note.  

## 2013-03-23 NOTE — Progress Notes (Signed)
   Subjective:    Patient ID: [name], female    DOB: 1919/08/14, 79 y.o.   MRN: [id number]  HPI  BP Readings from Last 3 Encounters:  03/23/13 142/80  02/17/13 168/80  09/23/12 160/90     Review of Systems     Objective:   Physical Exam        Assessment & Plan:

## 2013-03-23 NOTE — Progress Notes (Signed)
Subjective:    Patient ID: [name], female    DOB: [birth year], 79 y.o.   MRN: [id number]  HPI  79 year old patient who has essential hypertension. She was seen one month ago and placed on hydrochlorothiazide D2 extremity edema. She has had some elevated systolic readings throughout the year. Elevated blood pressure readings have also been documented at home. She feels quite well without concerns or complaints. Her edema has improved. He  Past Medical History  Diagnosis Date  . CARCINOMA, COLON 02/03/2007  . HYPERLIPIDEMIA 06/19/2007  . Unspecified essential hypertension 02/03/2007  . HYPOTENSION, ORTHOSTATIC 12/24/2007  . PANCREATITIS, ACUTE 12/27/2007  . RENAL FAILURE, ACUTE 12/27/2007  . KIDNEY STONE 02/04/2007  . 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 Lumbago 03/13/2007  . SYNCOPE 03/13/2007  . Dysuria 02/04/2007  . NEPHROLITHIASIS, HX OF 06/19/2007  . LEG PAIN, LEFT 05/14/2010    History   Social History  . Marital Status: Married    Spouse Name: N/A    Number of Children: N/A  . Years of Education: N/A   Occupational History  . Not on file.   Social History Main Topics  . Smoking status: Never Smoker   . Smokeless tobacco: Never Used  . Alcohol Use: No  . Drug Use: No  . Sexual Activity: Not on file   Other Topics Concern  . Not on file   Social History Narrative  . No narrative on file    Past Surgical History  Procedure Laterality Date  . Colon surgery  2002  . Cystoscopy      Family History  Problem Relation Age of Onset  . Cirrhosis Mother 52    nonalcholic  . Leukemia Father     Allergies  Allergen Reactions  . Amoxicillin-Pot Clavulanate     REACTION: hives    Current Outpatient Prescriptions on File Prior to Visit  Medication Sig Dispense Refill  . amLODipine (NORVASC) 5 MG tablet Take 1 tablet (5 mg total) by mouth daily.  90 tablet  6  . calcium-vitamin D (OSCAL WITH D) 500-200 MG-UNIT per tablet Take 1 tablet by mouth daily.         . Flaxseed, Linseed, (FLAXSEED OIL) 1000 MG CAPS Take 2 capsules by mouth 2 (two) times daily.      . metoprolol (LOPRESSOR) 50 MG tablet Take 0.5 tablets (25 mg total) by mouth 2 (two) times daily.  90 tablet  3  . Multiple Vitamins-Minerals (ONE-A-DAY WOMENS 50 PLUS PO) Take 1 tablet by mouth daily.      . Omega-3 Fatty Acids (FISH OIL) 1200 MG CAPS Take by mouth daily.        . Ostomy Supplies (COLOPLAST DRAINABLE POUCH) POUCH MISC 1 each by Does not apply route as needed.  20 each  3  . traMADol (ULTRAM) 50 MG tablet Take 1 tablet (50 mg total) by mouth every 6 (six) hours as needed. Maximum dose= 8 tablets per day  90 tablet  4  . hydrochlorothiazide (HYDRODIURIL) 25 MG tablet Take 1 tablet (25 mg total) by mouth daily.  30 tablet  2   No current facility-administered medications on file prior to visit.    BP 142/80  Pulse 58  Temp(Src) 97.7 F (36.5 C) (Oral)  Resp 20  Wt 179 lb (81.194 kg)  SpO2 98%       Review of Systems  Constitutional: Negative.   HENT: Negative for congestion, dental problem, hearing loss, rhinorrhea, sinus pressure, sore throat and tinnitus.  Eyes: Negative for pain, discharge and visual disturbance.  Respiratory: Negative for cough and shortness of breath.   Cardiovascular: Positive for leg swelling. Negative for chest pain and palpitations.  Gastrointestinal: Negative for nausea, vomiting, abdominal pain, diarrhea, constipation, blood in stool and abdominal distention.  Genitourinary: Negative for dysuria, urgency, frequency, hematuria, flank pain, vaginal bleeding, vaginal discharge, difficulty urinating, vaginal pain and pelvic pain.  Musculoskeletal: Negative for arthralgias, gait problem and joint swelling.  Skin: Negative for rash.  Neurological: Negative for dizziness, syncope, speech difficulty, weakness, numbness and headaches.  Hematological: Negative for adenopathy.  Psychiatric/Behavioral: Negative for behavioral problems, dysphoric  mood and agitation. The patient is not nervous/anxious.        Objective:   Physical Exam  Constitutional: She is oriented to person, place, and time. She appears well-developed and well-nourished.  HENT:  Head: Normocephalic.  Right Ear: External ear normal.  Left Ear: External ear normal.  Mouth/Throat: Oropharynx is clear and moist.  Eyes: Conjunctivae and EOM are normal. Pupils are equal, round, and reactive to light.  Neck: Normal range of motion. Neck supple. No thyromegaly present.  Cardiovascular: Normal rate, regular rhythm, normal heart sounds and intact distal pulses.   Pulmonary/Chest: Effort normal and breath sounds normal.  Abdominal: Soft. Bowel sounds are normal. She exhibits no mass. There is no tenderness.  Musculoskeletal: Normal range of motion.  Trace pedal edema  Lymphadenopathy:    She has no cervical adenopathy.  Neurological: She is alert and oriented to person, place, and time.  Skin: Skin is warm and dry. No rash noted.  Psychiatric: She has a normal mood and affect. Her behavior is normal.          Assessment & Plan:   Hypertension improved Pedal  edema improved  We'll continue present regimen including hydrochlorothiazide. Recheck 6 months with lab

## 2013-03-23 NOTE — Patient Instructions (Signed)
Limit your sodium (Salt) intake  Return in 6 months for follow-up  

## 2013-06-30 ENCOUNTER — Other Ambulatory Visit: Payer: Self-pay | Admitting: Internal Medicine

## 2013-07-02 ENCOUNTER — Other Ambulatory Visit: Payer: Self-pay | Admitting: Internal Medicine

## 2013-09-22 ENCOUNTER — Telehealth: Payer: Self-pay | Admitting: Internal Medicine

## 2013-09-22 ENCOUNTER — Ambulatory Visit (INDEPENDENT_AMBULATORY_CARE_PROVIDER_SITE_OTHER): Payer: Medicare Other | Admitting: Internal Medicine

## 2013-09-22 ENCOUNTER — Encounter: Payer: Self-pay | Admitting: Internal Medicine

## 2013-09-22 VITALS — BP 140/80 | HR 89 | Temp 99.1°F | Resp 20 | Ht 62.5 in | Wt 174.0 lb

## 2013-09-22 DIAGNOSIS — I10 Essential (primary) hypertension: Secondary | ICD-10-CM

## 2013-09-22 DIAGNOSIS — B9789 Other viral agents as the cause of diseases classified elsewhere: Secondary | ICD-10-CM

## 2013-09-22 DIAGNOSIS — J069 Acute upper respiratory infection, unspecified: Secondary | ICD-10-CM

## 2013-09-22 NOTE — Patient Instructions (Signed)
Acute bronchitis symptoms for less than 10 days are generally not helped by antibiotics.  Take over-the-counter expectorants and cough medications such as  Mucinex DM.  Call if there is no improvement in 5 to 7 days or if  you develop worsening cough, fever, or new symptoms, such as shortness of breath or chest pain.   

## 2013-09-22 NOTE — Progress Notes (Signed)
Pre-visit discussion using our clinic review tool. No additional management support is needed unless otherwise documented below in the visit note.  

## 2013-09-22 NOTE — Telephone Encounter (Signed)
Relevant patient education assigned to patient using Emmi. ° °

## 2013-09-22 NOTE — Progress Notes (Signed)
Subjective:    Patient ID: [name], female    DOB: 06-19-1919, 80 y.o.   MRN: [id number]  HPI  80 year old patient who has treated hypertension.  She presents today with a two-day history of chest congestion and cough.  She's felt a bit unwell, but no real wheezing, shortness of breath, chest pain.  She has maintained in adequate oral intake  She is scheduled for a wellness exam.  Next week  Past Medical History  Diagnosis Date  . CARCINOMA, COLON 02/03/2007  . HYPERLIPIDEMIA 06/19/2007  . Unspecified essential hypertension 02/03/2007  . HYPOTENSION, ORTHOSTATIC 12/24/2007  . PANCREATITIS, ACUTE 12/27/2007  . RENAL FAILURE, ACUTE 12/27/2007  . KIDNEY STONE 02/04/2007  . 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 Lumbago 03/13/2007  . SYNCOPE 03/13/2007  . Dysuria 02/04/2007  . NEPHROLITHIASIS, HX OF 06/19/2007  . LEG PAIN, LEFT 05/14/2010    History   Social History  . Marital Status: Married    Spouse Name: N/A    Number of Children: N/A  . Years of Education: N/A   Occupational History  . Not on file.   Social History Main Topics  . Smoking status: Never Smoker   . Smokeless tobacco: Never Used  . Alcohol Use: No  . Drug Use: No  . Sexual Activity: Not on file   Other Topics Concern  . Not on file   Social History Narrative  . No narrative on file    Past Surgical History  Procedure Laterality Date  . Colon surgery  2002  . Cystoscopy      Family History  Problem Relation Age of Onset  . Cirrhosis Mother 80    nonalcholic  . Leukemia Father     Allergies  Allergen Reactions  . Amoxicillin-Pot Clavulanate     REACTION: hives    Current Outpatient Prescriptions on File Prior to Visit  Medication Sig Dispense Refill  . amLODipine (NORVASC) 5 MG tablet TAKE ONE TABLET BY MOUTH ONCE DAILY.  90 tablet  0  . calcium-vitamin D (OSCAL WITH D) 500-200 MG-UNIT per tablet Take 1 tablet by mouth daily.        . Flaxseed, Linseed, (FLAXSEED OIL) 1000 MG  CAPS Take 2 capsules by mouth 2 (two) times daily.      . hydrochlorothiazide (HYDRODIURIL) 25 MG tablet Take 1 tablet (25 mg total) by mouth daily.  90 tablet  2  . metoprolol (LOPRESSOR) 50 MG tablet TAKE ONE-HALF TABLET BY MOUTH TWICE DAILY.  90 tablet  0  . Multiple Vitamins-Minerals (ONE-A-DAY WOMENS 50 PLUS PO) Take 1 tablet by mouth daily.      . Omega-3 Fatty Acids (FISH OIL) 1200 MG CAPS Take by mouth daily.        . Ostomy Supplies (COLOPLAST DRAINABLE POUCH) POUCH MISC 1 each by Does not apply route as needed.  20 each  3  . traMADol (ULTRAM) 50 MG tablet Take 1 tablet (50 mg total) by mouth every 6 (six) hours as needed. Maximum dose= 8 tablets per day  90 tablet  4   No current facility-administered medications on file prior to visit.    BP 140/80  Pulse 89  Temp(Src) 99.1 F (37.3 C) (Oral)  Resp 20  Ht 5' 2.5" (1.588 m)  Wt 174 lb (78.926 kg)  BMI 31.30 kg/m2  SpO2 96%       Review of Systems  Constitutional: Positive for fatigue.  HENT: Positive for congestion and rhinorrhea. Negative for dental problem,  hearing loss, sinus pressure, sore throat and tinnitus.   Eyes: Negative for pain, discharge and visual disturbance.  Respiratory: Positive for cough. Negative for shortness of breath.   Cardiovascular: Negative for chest pain, palpitations and leg swelling.  Gastrointestinal: Negative for nausea, vomiting, abdominal pain, diarrhea, constipation, blood in stool and abdominal distention.  Genitourinary: Negative for dysuria, urgency, frequency, hematuria, flank pain, vaginal bleeding, vaginal discharge, difficulty urinating, vaginal pain and pelvic pain.  Musculoskeletal: Negative for arthralgias, gait problem and joint swelling.  Skin: Negative for rash.  Neurological: Negative for dizziness, syncope, speech difficulty, weakness, numbness and headaches.  Hematological: Negative for adenopathy.  Psychiatric/Behavioral: Negative for behavioral problems, dysphoric  mood and agitation. The patient is not nervous/anxious.        Objective:   Physical Exam  Constitutional: She is oriented to person, place, and time. She appears well-developed and well-nourished.  HENT:  Head: Normocephalic.  Right Ear: External ear normal.  Left Ear: External ear normal.  Mouth/Throat: Oropharynx is clear and moist.  Eyes: Conjunctivae and EOM are normal. Pupils are equal, round, and reactive to light.  Neck: Normal range of motion. Neck supple. No thyromegaly present.  Cardiovascular: Normal rate, regular rhythm, normal heart sounds and intact distal pulses.   Pulmonary/Chest: Effort normal and breath sounds normal. No respiratory distress. She has no wheezes. She has no rales.  Abdominal: Soft. Bowel sounds are normal. She exhibits no mass. There is no tenderness.  Musculoskeletal: Normal range of motion.  Lymphadenopathy:    She has no cervical adenopathy.  Neurological: She is alert and oriented to person, place, and time.  Skin: Skin is warm and dry. No rash noted.  Psychiatric: She has a normal mood and affect. Her behavior is normal.          Assessment & Plan:   Viral URI with cough.  We'll treat symptomatically Hypertension stable  Wellness  exam next week as scheduled.  We'll reassess at that

## 2013-09-24 ENCOUNTER — Encounter: Payer: Medicare Other | Admitting: Internal Medicine

## 2013-09-27 ENCOUNTER — Encounter: Payer: Self-pay | Admitting: Internal Medicine

## 2013-09-27 ENCOUNTER — Ambulatory Visit (INDEPENDENT_AMBULATORY_CARE_PROVIDER_SITE_OTHER): Payer: Medicare Other | Admitting: Internal Medicine

## 2013-09-27 VITALS — BP 128/76 | HR 78 | Temp 97.9°F | Ht 61.25 in | Wt 168.0 lb

## 2013-09-27 DIAGNOSIS — Z0000 Encounter for general adult medical examination without abnormal findings: Secondary | ICD-10-CM

## 2013-09-27 DIAGNOSIS — N179 Acute kidney failure, unspecified: Secondary | ICD-10-CM

## 2013-09-27 DIAGNOSIS — N209 Urinary calculus, unspecified: Secondary | ICD-10-CM

## 2013-09-27 DIAGNOSIS — Z87442 Personal history of urinary calculi: Secondary | ICD-10-CM

## 2013-09-27 DIAGNOSIS — C189 Malignant neoplasm of colon, unspecified: Secondary | ICD-10-CM

## 2013-09-27 DIAGNOSIS — E785 Hyperlipidemia, unspecified: Secondary | ICD-10-CM

## 2013-09-27 DIAGNOSIS — I10 Essential (primary) hypertension: Secondary | ICD-10-CM

## 2013-09-27 LAB — CBC WITH DIFFERENTIAL/PLATELET
Basophils Absolute: 0 10*3/uL (ref 0.0–0.1)
Basophils Relative: 0.3 % (ref 0.0–3.0)
Eosinophils Absolute: 0.1 10*3/uL (ref 0.0–0.7)
Eosinophils Relative: 1.2 % (ref 0.0–5.0)
HCT: 40.2 % (ref 36.0–46.0)
Hemoglobin: 13.6 g/dL (ref 12.0–15.0)
LYMPHS PCT: 36.8 % (ref 12.0–46.0)
Lymphs Abs: 3.3 10*3/uL (ref 0.7–4.0)
MCHC: 33.8 g/dL (ref 30.0–36.0)
MCV: 91.3 fl (ref 78.0–100.0)
Monocytes Absolute: 0.5 10*3/uL (ref 0.1–1.0)
Monocytes Relative: 5.9 % (ref 3.0–12.0)
NEUTROS PCT: 55.8 % (ref 43.0–77.0)
Neutro Abs: 5 10*3/uL (ref 1.4–7.7)
PLATELETS: 244 10*3/uL (ref 150.0–400.0)
RBC: 4.41 Mil/uL (ref 3.87–5.11)
RDW: 12.4 % (ref 11.5–15.5)
WBC: 9.1 10*3/uL (ref 4.0–10.5)

## 2013-09-27 LAB — COMPREHENSIVE METABOLIC PANEL
ALBUMIN: 3.9 g/dL (ref 3.5–5.2)
ALK PHOS: 45 U/L (ref 39–117)
ALT: 23 U/L (ref 0–35)
AST: 29 U/L (ref 0–37)
BUN: 23 mg/dL (ref 6–23)
CALCIUM: 10.3 mg/dL (ref 8.4–10.5)
CHLORIDE: 97 meq/L (ref 96–112)
CO2: 30 mEq/L (ref 19–32)
Creatinine, Ser: 2.3 mg/dL — ABNORMAL HIGH (ref 0.4–1.2)
GFR: 21.31 mL/min — ABNORMAL LOW (ref >60.00)
Glucose, Bld: 123 mg/dL — ABNORMAL HIGH (ref 70–99)
POTASSIUM: 3.1 meq/L — AB (ref 3.5–5.1)
SODIUM: 138 meq/L (ref 135–145)
TOTAL PROTEIN: 7.2 g/dL (ref 6.0–8.3)
Total Bilirubin: 0.7 mg/dL (ref 0.2–1.2)

## 2013-09-27 LAB — TSH: TSH: 1.95 u[IU]/mL (ref 0.35–4.50)

## 2013-09-27 MED ORDER — HYDROCHLOROTHIAZIDE 25 MG PO TABS: 25.0000 mg | ORAL_TABLET | Freq: Every day | ORAL | Status: DC

## 2013-09-27 MED ORDER — METOPROLOL TARTRATE 50 MG PO TABS: ORAL_TABLET | ORAL | Status: DC

## 2013-09-27 MED ORDER — AMLODIPINE BESYLATE 5 MG PO TABS: ORAL_TABLET | ORAL | Status: DC

## 2013-09-27 NOTE — Progress Notes (Signed)
Subjective:    Patient ID: [name], female    DOB: 05-09-1919, 80 y.o.   MRN: [id number]  HPI  Patient ID: [name], female   DOB: 16-Jan-1920, 80 y.o.   MRN: [id number]  Subjective:    Patient ID: [name], female    DOB: 07/11/1919, 80 y.o.   MRN: [id number]  HPI 84 -year-old patient who is seen today for a wellness exam.  She has remote history of colon carcinoma in 2008. She did have a followup colonoscopy in 2009. She was last noted to the hospital in the spring of last year due to a left-sided renal colic she has a functioning colostomy. She does remarkably. No real concerns or complaints today except for resolving URI symptoms. She does have some back pain which does well with when necessary tramadol. She has treated hypertension which has done well on treatment. She has a prior history of dyslipidemia but apparently no longer takes a statin.  1. Risk factors, based on past  M,S,F history- cardiovascular risk factors include age hypertension and dyslipidemia  2.  Physical activities: Limited due to her age arthritis.  3.  Depression/mood: No history of depression or mood disorder  4.  Hearing: No significant deficits  5.  ADL's: Lives with her daughter. Fairly independent and aspects of daily living  6.  Fall risk: moderate due to age and arthritis  7.  Home safety: No problems identified  8.  Height weight, and visual acuity; height and weight stable no change in visual acuity  9.  Counseling: Heart healthy diet more regular exercise encouraged  10. Lab orders based on risk factors: Laboratory profile will be reviewed  11. Referral : Not appropriate at this time  12. Care plan: Continue present regimen medications refilled  13. Cognitive assessment: Alert and oriented with normal affect. No cognitive dysfunction.   Past Medical History  Diagnosis Date  . CARCINOMA, COLON 02/03/2007  . HYPERLIPIDEMIA 06/19/2007  . Unspecified essential  hypertension 02/03/2007  . HYPOTENSION, ORTHOSTATIC 12/24/2007  . PANCREATITIS, ACUTE 12/27/2007  . RENAL FAILURE, ACUTE 12/27/2007  . KIDNEY STONE 02/04/2007  . 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 Lumbago 03/13/2007  . SYNCOPE 03/13/2007  . Dysuria 02/04/2007  . NEPHROLITHIASIS, HX OF 06/19/2007  . LEG PAIN, LEFT 05/14/2010    History   Social History  . Marital Status: Married    Spouse Name: N/A    Number of Children: N/A  . Years of Education: N/A   Occupational History  . Not on file.   Social History Main Topics  . Smoking status: Never Smoker   . Smokeless tobacco: Never Used  . Alcohol Use: No  . Drug Use: No  . Sexual Activity: Not on file   Other Topics Concern  . Not on file   Social History Narrative  . No narrative on file    Past Surgical History  Procedure Laterality Date  . Colon surgery  2002  . Cystoscopy      Family History  Problem Relation Age of Onset  . Cirrhosis Mother 30    nonalcholic  . Leukemia Father     Allergies  Allergen Reactions  . Amoxicillin-Pot Clavulanate     REACTION: hives    Current Outpatient Prescriptions on File Prior to Visit  Medication Sig Dispense Refill  . calcium-vitamin D (OSCAL WITH D) 500-200 MG-UNIT per tablet Take 1 tablet by mouth daily.        . Flaxseed,  Linseed, (FLAXSEED OIL) 1000 MG CAPS Take 2 capsules by mouth 2 (two) times daily.      . Multiple Vitamins-Minerals (ONE-A-DAY WOMENS 50 PLUS PO) Take 1 tablet by mouth daily.      . Omega-3 Fatty Acids (FISH OIL) 1200 MG CAPS Take by mouth daily.        . Ostomy Supplies (COLOPLAST DRAINABLE POUCH) POUCH MISC 1 each by Does not apply route as needed.  20 each  3   No current facility-administered medications on file prior to visit.    BP 128/76  Pulse 78  Temp(Src) 97.9 F (36.6 C) (Oral)  Ht 5' 1.25" (1.556 m)  Wt 168 lb (76.204 kg)  BMI 31.47 kg/m2  SpO2 96%      Review of Systems  Constitutional: Negative.   HENT: Negative for  hearing loss, congestion, sore throat, rhinorrhea, dental problem, sinus pressure and tinnitus.   Eyes: Negative for pain, discharge and visual disturbance.  Respiratory: Negative for cough and shortness of breath.   Cardiovascular: Negative for chest pain, palpitations and leg swelling.  Gastrointestinal: Negative for nausea, vomiting, abdominal pain, diarrhea, constipation, blood in stool and abdominal distention.  Genitourinary: Negative for dysuria, urgency, frequency, hematuria, flank pain, vaginal bleeding, vaginal discharge, difficulty urinating, vaginal pain and pelvic pain.  Musculoskeletal: Positive for back pain and gait problem. Negative for joint swelling and arthralgias.  Skin: Negative for rash.  Neurological: Negative for dizziness, syncope, speech difficulty, weakness, numbness and headaches.  Hematological: Negative for adenopathy.  Psychiatric/Behavioral: Negative for behavioral problems, dysphoric mood and agitation. The patient is not nervous/anxious.        Objective:   Physical Exam  Constitutional: She is oriented to person, place, and time. She appears well-developed and well-nourished.  HENT:  Head: Normocephalic and atraumatic.  Right Ear: External ear normal.  Left Ear: External ear normal.  Mouth/Throat: Oropharynx is clear and moist.  Dentures in place  Eyes: Conjunctivae and EOM are normal.  Neck: Normal range of motion. Neck supple. No JVD present. No thyromegaly present.  Cardiovascular: Normal rate, regular rhythm and normal heart sounds.   No murmur heard. Dorsalis pedis pulses palpable. Posterior tibial pulses nonpalpable. Trace edema  Pulmonary/Chest: Effort normal and breath sounds normal. She has no wheezes. She has no rales.  Abdominal: Soft. Bowel sounds are normal. She exhibits no distension and no mass. There is no tenderness. There is no rebound and no guarding.  Midline surgical scar Colostomy right midabdominal wall  Genitourinary: Vagina  normal.  Musculoskeletal: Normal range of motion. She exhibits no edema and no tenderness.  Neurological: She is alert and oriented to person, place, and time. She has normal reflexes. No cranial nerve deficit. She exhibits normal muscle tone. Coordination normal.  Unsteady gait  Mild buccal lingual dyskinesia  Skin: Skin is warm and dry. No rash noted.  Psychiatric: She has a normal mood and affect. Her behavior is normal.          Assessment & Plan:   Preventive health examination Remote colon carcinoma Dyslipidemia.  Hypertension well controlled Osteoarthritis with chronic back pain. Tramadol refill  Recheck in one year or as needed  Review of Systems As above    Objective:   Physical Exam  Cardiovascular:  Pedal pulses absent, except for an intact left dorsalis pedis pulse          Assessment & Plan:   Preventive health examination Hypertension well controlled  Medications updated Recheck 6 months

## 2013-09-27 NOTE — Patient Instructions (Signed)
Limit your sodium (Salt) intake  Return in 6 months for follow-up  

## 2013-09-27 NOTE — Progress Notes (Signed)
Pre visit review using our clinic review tool, if applicable. No additional management support is needed unless otherwise documented below in the visit note. 

## 2013-10-18 ENCOUNTER — Ambulatory Visit (INDEPENDENT_AMBULATORY_CARE_PROVIDER_SITE_OTHER): Payer: Medicare Other | Admitting: Internal Medicine

## 2013-10-18 ENCOUNTER — Encounter: Payer: Self-pay | Admitting: Internal Medicine

## 2013-10-18 VITALS — BP 150/80 | HR 51 | Temp 97.9°F | Resp 20 | Ht 61.25 in | Wt 177.0 lb

## 2013-10-18 DIAGNOSIS — N179 Acute kidney failure, unspecified: Secondary | ICD-10-CM

## 2013-10-18 DIAGNOSIS — I10 Essential (primary) hypertension: Secondary | ICD-10-CM

## 2013-10-18 LAB — BASIC METABOLIC PANEL
BUN: 19 mg/dL (ref 6–23)
CHLORIDE: 111 meq/L (ref 96–112)
CO2: 24 mEq/L (ref 19–32)
Calcium: 10.2 mg/dL (ref 8.4–10.5)
Creatinine, Ser: 1.5 mg/dL — ABNORMAL HIGH (ref 0.4–1.2)
GFR: 34.82 mL/min — ABNORMAL LOW (ref >60.00)
Glucose, Bld: 105 mg/dL — ABNORMAL HIGH (ref 70–99)
POTASSIUM: 4.5 meq/L (ref 3.5–5.1)
Sodium: 144 mEq/L (ref 135–145)

## 2013-10-18 NOTE — Progress Notes (Signed)
Pre visit review using our clinic review tool, if applicable. No additional management support is needed unless otherwise documented below in the visit note. 

## 2013-10-18 NOTE — Patient Instructions (Signed)
Limit your sodium (Salt) intake  Please check your blood pressure on a regular basis.  If it is consistently greater than 150/90, please make an office appointment.  Return in 4 months for follow-up  

## 2013-10-18 NOTE — Progress Notes (Signed)
Subjective:    Patient ID: [name], female    DOB: [birth year], 80 y.o.   MRN: [id number]  HPI  80 year old patient who was seen 3 weeks ago for her annual preventive health exam.  She has a history of hypertension, which has been controlled on triple therapy including diuretic therapy.  Laboratory studies revealed hypokalemia, as well as a significant increase in serum creatinine.  Hydrochlorothiazide was discontinued, and the patient has liberalized her fluid intake.  She currently feels well.  Past Medical History  Diagnosis Date  . CARCINOMA, COLON 02/03/2007  . HYPERLIPIDEMIA 06/19/2007  . Unspecified essential hypertension 02/03/2007  . HYPOTENSION, ORTHOSTATIC 12/24/2007  . PANCREATITIS, ACUTE 12/27/2007  . RENAL FAILURE, ACUTE 12/27/2007  . KIDNEY STONE 02/04/2007  . 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 Lumbago 03/13/2007  . SYNCOPE 03/13/2007  . Dysuria 02/04/2007  . NEPHROLITHIASIS, HX OF 06/19/2007  . LEG PAIN, LEFT 05/14/2010    History   Social History  . Marital Status: Married    Spouse Name: N/A    Number of Children: N/A  . Years of Education: N/A   Occupational History  . Not on file.   Social History Main Topics  . Smoking status: Never Smoker   . Smokeless tobacco: Never Used  . Alcohol Use: No  . Drug Use: No  . Sexual Activity: Not on file   Other Topics Concern  . Not on file   Social History Narrative  . No narrative on file    Past Surgical History  Procedure Laterality Date  . Colon surgery  2002  . Cystoscopy      Family History  Problem Relation Age of Onset  . Cirrhosis Mother 37    nonalcholic  . Leukemia Father     Allergies  Allergen Reactions  . Amoxicillin-Pot Clavulanate     REACTION: hives    Current Outpatient Prescriptions on File Prior to Visit  Medication Sig Dispense Refill  . acetaminophen (TYLENOL) 500 MG tablet Take 500 mg by mouth every 6 (six) hours as needed.      Marland Kitchen amLODipine (NORVASC) 5 MG  tablet TAKE ONE TABLET BY MOUTH ONCE DAILY.  90 tablet  3  . calcium-vitamin D (OSCAL WITH D) 500-200 MG-UNIT per tablet Take 1 tablet by mouth daily.        . Flaxseed, Linseed, (FLAXSEED OIL) 1000 MG CAPS Take 2 capsules by mouth 2 (two) times daily.      . metoprolol (LOPRESSOR) 50 MG tablet TAKE ONE-HALF TABLET BY MOUTH TWICE DAILY.  90 tablet  3  . Multiple Vitamins-Minerals (ONE-A-DAY WOMENS 50 PLUS PO) Take 1 tablet by mouth daily.      . Omega-3 Fatty Acids (FISH OIL) 1200 MG CAPS Take by mouth daily.        . Ostomy Supplies (COLOPLAST DRAINABLE POUCH) POUCH MISC 1 each by Does not apply route as needed.  20 each  3   No current facility-administered medications on file prior to visit.    BP 150/80  Pulse 51  Temp(Src) 97.9 F (36.6 C) (Oral)  Resp 20  Ht 5' 1.25" (1.556 m)  Wt 177 lb (80.287 kg)  BMI 33.16 kg/m2  SpO2 96%       Review of Systems  Constitutional: Negative.   HENT: Negative for congestion, dental problem, hearing loss, rhinorrhea, sinus pressure, sore throat and tinnitus.   Eyes: Negative for pain, discharge and visual disturbance.  Respiratory: Negative for cough and shortness of  breath.   Cardiovascular: Negative for chest pain, palpitations and leg swelling.  Gastrointestinal: Negative for nausea, vomiting, abdominal pain, diarrhea, constipation, blood in stool and abdominal distention.  Genitourinary: Negative for dysuria, urgency, frequency, hematuria, flank pain, vaginal bleeding, vaginal discharge, difficulty urinating, vaginal pain and pelvic pain.  Musculoskeletal: Positive for gait problem. Negative for arthralgias and joint swelling.  Skin: Negative for rash.  Neurological: Negative for dizziness, syncope, speech difficulty, weakness, numbness and headaches.  Hematological: Negative for adenopathy.  Psychiatric/Behavioral: Negative for behavioral problems, dysphoric mood and agitation. The patient is not nervous/anxious.        Objective:     Physical Exam  Constitutional: She appears well-developed and well-nourished. No distress.  Blood pressure 130/80  Cardiovascular: Normal rate, regular rhythm and normal heart sounds.   Pulmonary/Chest: Effort normal and breath sounds normal.  Musculoskeletal: She exhibits edema.  Trace ankle edema          Assessment & Plan:   Hypertension.  Well controlled History of hypokalemia and worsening azotemia.  We'll check electrolytes and renal indices  Recheck 3 months

## 2014-01-07 ENCOUNTER — Ambulatory Visit (INDEPENDENT_AMBULATORY_CARE_PROVIDER_SITE_OTHER): Payer: Medicare Other

## 2014-01-07 DIAGNOSIS — Z23 Encounter for immunization: Secondary | ICD-10-CM

## 2014-03-15 ENCOUNTER — Ambulatory Visit (INDEPENDENT_AMBULATORY_CARE_PROVIDER_SITE_OTHER): Payer: Medicare Other | Admitting: Internal Medicine

## 2014-03-15 ENCOUNTER — Encounter: Payer: Self-pay | Admitting: Internal Medicine

## 2014-03-15 VITALS — BP 140/80 | HR 69 | Temp 98.1°F | Resp 20 | Ht 61.25 in | Wt 169.0 lb

## 2014-03-15 DIAGNOSIS — C189 Malignant neoplasm of colon, unspecified: Secondary | ICD-10-CM

## 2014-03-15 DIAGNOSIS — I10 Essential (primary) hypertension: Secondary | ICD-10-CM

## 2014-03-15 DIAGNOSIS — E785 Hyperlipidemia, unspecified: Secondary | ICD-10-CM

## 2014-03-15 MED ORDER — LOPERAMIDE HCL 2 MG PO CAPS: 2.0000 mg | ORAL_CAPSULE | ORAL | Status: DC | PRN

## 2014-03-15 NOTE — Progress Notes (Signed)
Subjective:    Patient ID: [name], female    DOB: 1919/09/01, 80 y.o.   MRN: [id number]  HPI 80 year old patient who is seen today in follow-up.  She has treated hypertension.  She has remote history of colon cancer and does have a colostomy.  Doing quite well.  She also has a history of dyslipidemia.  No major concerns or complaints except for some tenderness about her ostomy site in the right lower quadrant.  She has had some loose bowel movements for the past 3 weeks and has developed some local skin irritation.  Past Medical History  Diagnosis Date  . CARCINOMA, COLON 02/03/2007  . HYPERLIPIDEMIA 06/19/2007  . Unspecified essential hypertension 02/03/2007  . HYPOTENSION, ORTHOSTATIC 12/24/2007  . PANCREATITIS, ACUTE 12/27/2007  . RENAL FAILURE, ACUTE 12/27/2007  . KIDNEY STONE 02/04/2007  . 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 Lumbago 03/13/2007  . SYNCOPE 03/13/2007  . Dysuria 02/04/2007  . NEPHROLITHIASIS, HX OF 06/19/2007  . LEG PAIN, LEFT 05/14/2010    History   Social History  . Marital Status: Married    Spouse Name: N/A    Number of Children: N/A  . Years of Education: N/A   Occupational History  . Not on file.   Social History Main Topics  . Smoking status: Never Smoker   . Smokeless tobacco: Never Used  . Alcohol Use: No  . Drug Use: No  . Sexual Activity: Not on file   Other Topics Concern  . Not on file   Social History Narrative    Past Surgical History  Procedure Laterality Date  . Colon surgery  2002  . Cystoscopy      Family History  Problem Relation Age of Onset  . Cirrhosis Mother 70    nonalcholic  . Leukemia Father     Allergies  Allergen Reactions  . Amoxicillin-Pot Clavulanate     REACTION: hives    Current Outpatient Prescriptions on File Prior to Visit  Medication Sig Dispense Refill  . acetaminophen (TYLENOL) 500 MG tablet Take 500 mg by mouth every 6 (six) hours as needed.    Marland Kitchen amLODipine (NORVASC) 5 MG tablet  TAKE ONE TABLET BY MOUTH ONCE DAILY. 90 tablet 3  . calcium-vitamin D (OSCAL WITH D) 500-200 MG-UNIT per tablet Take 1 tablet by mouth daily.      . Flaxseed, Linseed, (FLAXSEED OIL) 1000 MG CAPS Take 2 capsules by mouth 2 (two) times daily.    . metoprolol (LOPRESSOR) 50 MG tablet TAKE ONE-HALF TABLET BY MOUTH TWICE DAILY. 90 tablet 3  . Multiple Vitamins-Minerals (ONE-A-DAY WOMENS 50 PLUS PO) Take 1 tablet by mouth daily.    . Omega-3 Fatty Acids (FISH OIL) 1200 MG CAPS Take by mouth daily.      . Ostomy Supplies (COLOPLAST DRAINABLE POUCH) POUCH MISC 1 each by Does not apply route as needed. 20 each 3   No current facility-administered medications on file prior to visit.    BP 140/80 mmHg  Pulse 69  Temp(Src) 98.1 F (36.7 C) (Oral)  Resp 20  Ht 5' 1.25" (1.556 m)  Wt 169 lb (76.658 kg)  BMI 31.66 kg/m2  SpO2 98%      Review of Systems  Constitutional: Negative.   HENT: Negative for congestion, dental problem, hearing loss, rhinorrhea, sinus pressure, sore throat and tinnitus.   Eyes: Negative for pain, discharge and visual disturbance.  Respiratory: Negative for cough and shortness of breath.   Cardiovascular: Negative for chest pain,  palpitations and leg swelling.  Gastrointestinal: Positive for diarrhea. Negative for nausea, vomiting, abdominal pain, constipation, blood in stool and abdominal distention.  Genitourinary: Negative for dysuria, urgency, frequency, hematuria, flank pain, vaginal bleeding, vaginal discharge, difficulty urinating, vaginal pain and pelvic pain.  Musculoskeletal: Negative for joint swelling, arthralgias and gait problem.  Skin: Positive for rash.  Neurological: Negative for dizziness, syncope, speech difficulty, weakness, numbness and headaches.  Hematological: Negative for adenopathy.  Psychiatric/Behavioral: Negative for behavioral problems, dysphoric mood and agitation. The patient is not nervous/anxious.        Objective:   Physical Exam    Constitutional: She is oriented to person, place, and time. She appears well-developed and well-nourished.  HENT:  Head: Normocephalic.  Right Ear: External ear normal.  Left Ear: External ear normal.  Mouth/Throat: Oropharynx is clear and moist.  Eyes: Conjunctivae and EOM are normal. Pupils are equal, round, and reactive to light.  Neck: Normal range of motion. Neck supple. No thyromegaly present.  Cardiovascular: Normal rate, regular rhythm, normal heart sounds and intact distal pulses.   Pulmonary/Chest: Effort normal and breath sounds normal.  Abdominal: Soft. Bowel sounds are normal. She exhibits no mass. There is no tenderness.  Musculoskeletal: Normal range of motion.  Lymphadenopathy:    She has no cervical adenopathy.  Neurological: She is alert and oriented to person, place, and time.  Skin: Skin is warm and dry. Rash noted.  Mild erythema about the stoma  Psychiatric: She has a normal mood and affect. Her behavior is normal.          Assessment & Plan:   Hypertension, well-controlled Diarrhea.  Will treat with antidiarrheal Status post colorectal Cancer

## 2014-03-15 NOTE — Patient Instructions (Signed)
Limit your sodium (Salt) intake  Return in 6 months for follow-up  

## 2014-03-15 NOTE — Progress Notes (Signed)
Pre visit review using our clinic review tool, if applicable. No additional management support is needed unless otherwise documented below in the visit note. 

## 2014-03-29 ENCOUNTER — Ambulatory Visit: Payer: Medicare Other | Admitting: Internal Medicine

## 2014-08-05 ENCOUNTER — Encounter: Payer: Self-pay | Admitting: Family Medicine

## 2014-08-05 ENCOUNTER — Ambulatory Visit (INDEPENDENT_AMBULATORY_CARE_PROVIDER_SITE_OTHER): Payer: Medicare Other | Admitting: Family Medicine

## 2014-08-05 VITALS — BP 140/78 | HR 61 | Temp 97.6°F | Wt 174.0 lb

## 2014-08-05 DIAGNOSIS — R600 Localized edema: Secondary | ICD-10-CM | POA: Diagnosis not present

## 2014-08-05 DIAGNOSIS — N183 Chronic kidney disease, stage 3 unspecified: Secondary | ICD-10-CM

## 2014-08-05 LAB — HEPATIC FUNCTION PANEL
ALBUMIN: 3.9 g/dL (ref 3.5–5.2)
ALT: 13 U/L (ref 0–35)
AST: 19 U/L (ref 0–37)
Alkaline Phosphatase: 56 U/L (ref 39–117)
BILIRUBIN DIRECT: 0 mg/dL (ref 0.0–0.3)
TOTAL PROTEIN: 6.8 g/dL (ref 6.0–8.3)
Total Bilirubin: 0.5 mg/dL (ref 0.2–1.2)

## 2014-08-05 LAB — BASIC METABOLIC PANEL
BUN: 16 mg/dL (ref 6–23)
CO2: 29 meq/L (ref 19–32)
Calcium: 10 mg/dL (ref 8.4–10.5)
Chloride: 106 mEq/L (ref 96–112)
Creatinine, Ser: 1.6 mg/dL — ABNORMAL HIGH (ref 0.40–1.20)
GFR: 32.01 mL/min — ABNORMAL LOW (ref >60.00)
GLUCOSE: 96 mg/dL (ref 70–99)
POTASSIUM: 4 meq/L (ref 3.5–5.1)
SODIUM: 141 meq/L (ref 135–145)

## 2014-08-05 LAB — BRAIN NATRIURETIC PEPTIDE: PRO B NATRI PEPTIDE: 60 pg/mL (ref 0.0–100.0)

## 2014-08-05 LAB — TSH: TSH: 0.84 u[IU]/mL (ref 0.35–4.50)

## 2014-08-05 NOTE — Progress Notes (Signed)
   Subjective:    Patient ID: [name], female    DOB: Jan 25, 1920, 81 y.o.   MRN: [id number]  HPI   Patient seen with complaints of increased edema feet and hands and legs over the past couple days. She states that her symptoms are actually improved today compared to yesterday. She does not have any history of heart failure. She has hypertension treated with metoprolol and amlodipine. She thinks her swelling was somewhat progressively worse as the day went on yesterday. She currently does not take any diuretics. She has had somewhat similar episode once before. She does have chronic kidney disease  Past Medical History  Diagnosis Date  . CARCINOMA, COLON 02/03/2007  . HYPERLIPIDEMIA 06/19/2007  . Unspecified essential hypertension 02/03/2007  . HYPOTENSION, ORTHOSTATIC 12/24/2007  . PANCREATITIS, ACUTE 12/27/2007  . RENAL FAILURE, ACUTE 12/27/2007  . KIDNEY STONE 02/04/2007  . 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 Lumbago 03/13/2007  . SYNCOPE 03/13/2007  . Dysuria 02/04/2007  . NEPHROLITHIASIS, HX OF 06/19/2007  . LEG PAIN, LEFT 05/14/2010   Past Surgical History  Procedure Laterality Date  . Colon surgery  2002  . Cystoscopy      reports that she has never smoked. She has never used smokeless tobacco. She reports that she does not drink alcohol or use illicit drugs. family history includes Cirrhosis (age of onset: 5) in her mother; Leukemia in her father. Allergies  Allergen Reactions  . Amoxicillin-Pot Clavulanate     REACTION: hives      Review of Systems  Constitutional: Negative for fatigue and unexpected weight change.  Eyes: Negative for visual disturbance.  Respiratory: Negative for cough, chest tightness, shortness of breath and wheezing.   Cardiovascular: Positive for leg swelling. Negative for chest pain and palpitations.  Genitourinary: Negative for dysuria.  Neurological: Negative for dizziness, seizures, syncope, weakness, light-headedness and headaches.         Objective:   Physical Exam  Constitutional: She appears well-developed and well-nourished.  HENT:  Mouth/Throat: Oropharynx is clear and moist.  Neck: No JVD present.  Cardiovascular: Normal rate and regular rhythm.  Exam reveals no gallop.   Pulmonary/Chest: Effort normal and breath sounds normal. No respiratory distress. She has no wheezes. She has no rales.  Musculoskeletal: She exhibits edema.          Assessment & Plan:  Bilateral leg edema. Patient has no history of heart failure. She is on low-dose amlodipine but has been on this for quite some time. Her edema is actually somewhat improved today. Check further labs with basic metabolic panel, hepatic panel, TSH, BNP. We'll elected not to start any diuretics at this point since her symptoms seem to be somewhat better. We have suggested daily weights and be in touch if her weight increases suddenly > 2 pounds in 1 day

## 2014-08-05 NOTE — Progress Notes (Signed)
Pre visit review using our clinic review tool, if applicable. No additional management support is needed unless otherwise documented below in the visit note. 

## 2014-08-13 ENCOUNTER — Inpatient Hospital Stay (HOSPITAL_COMMUNITY)
Admission: EM | Admit: 2014-08-13 | Discharge: 2014-08-18 | DRG: 394 | Disposition: A | Discharge status: Home / Self Care | Payer: Medicare Other | Attending: Internal Medicine | Admitting: Internal Medicine

## 2014-08-13 ENCOUNTER — Emergency Department (HOSPITAL_COMMUNITY): Payer: Medicare Other

## 2014-08-13 ENCOUNTER — Encounter (HOSPITAL_COMMUNITY): Payer: Self-pay | Admitting: Emergency Medicine

## 2014-08-13 DIAGNOSIS — N179 Acute kidney failure, unspecified: Secondary | ICD-10-CM | POA: Diagnosis present

## 2014-08-13 DIAGNOSIS — R140 Abdominal distension (gaseous): Secondary | ICD-10-CM

## 2014-08-13 DIAGNOSIS — K433 Parastomal hernia with obstruction, without gangrene: Secondary | ICD-10-CM | POA: Diagnosis not present

## 2014-08-13 DIAGNOSIS — N183 Chronic kidney disease, stage 3 unspecified: Secondary | ICD-10-CM | POA: Diagnosis present

## 2014-08-13 DIAGNOSIS — I951 Orthostatic hypotension: Secondary | ICD-10-CM | POA: Diagnosis present

## 2014-08-13 DIAGNOSIS — Z933 Colostomy status: Secondary | ICD-10-CM

## 2014-08-13 DIAGNOSIS — K5660 Unspecified intestinal obstruction: Secondary | ICD-10-CM | POA: Diagnosis present

## 2014-08-13 DIAGNOSIS — E785 Hyperlipidemia, unspecified: Secondary | ICD-10-CM | POA: Diagnosis present

## 2014-08-13 DIAGNOSIS — Z87442 Personal history of urinary calculi: Secondary | ICD-10-CM

## 2014-08-13 DIAGNOSIS — D649 Anemia, unspecified: Secondary | ICD-10-CM | POA: Diagnosis present

## 2014-08-13 DIAGNOSIS — I129 Hypertensive chronic kidney disease with stage 1 through stage 4 chronic kidney disease, or unspecified chronic kidney disease: Secondary | ICD-10-CM | POA: Diagnosis present

## 2014-08-13 DIAGNOSIS — E876 Hypokalemia: Secondary | ICD-10-CM | POA: Diagnosis present

## 2014-08-13 DIAGNOSIS — K436 Other and unspecified ventral hernia with obstruction, without gangrene: Secondary | ICD-10-CM | POA: Diagnosis present

## 2014-08-13 DIAGNOSIS — R109 Unspecified abdominal pain: Secondary | ICD-10-CM | POA: Diagnosis not present

## 2014-08-13 DIAGNOSIS — Z85038 Personal history of other malignant neoplasm of large intestine: Secondary | ICD-10-CM

## 2014-08-13 DIAGNOSIS — Z9049 Acquired absence of other specified parts of digestive tract: Secondary | ICD-10-CM | POA: Diagnosis present

## 2014-08-13 DIAGNOSIS — C189 Malignant neoplasm of colon, unspecified: Secondary | ICD-10-CM | POA: Diagnosis present

## 2014-08-13 DIAGNOSIS — K56609 Unspecified intestinal obstruction, unspecified as to partial versus complete obstruction: Secondary | ICD-10-CM | POA: Diagnosis present

## 2014-08-13 DIAGNOSIS — R112 Nausea with vomiting, unspecified: Secondary | ICD-10-CM

## 2014-08-13 DIAGNOSIS — Z881 Allergy status to other antibiotic agents status: Secondary | ICD-10-CM

## 2014-08-13 LAB — COMPREHENSIVE METABOLIC PANEL
ALT: 14 U/L (ref 0–35)
ANION GAP: 11 (ref 5–15)
AST: 21 U/L (ref 0–37)
Albumin: 3.8 g/dL (ref 3.5–5.2)
Alkaline Phosphatase: 64 U/L (ref 39–117)
BILIRUBIN TOTAL: 0.6 mg/dL (ref 0.3–1.2)
BUN: 14 mg/dL (ref 6–23)
CALCIUM: 9.7 mg/dL (ref 8.4–10.5)
CHLORIDE: 105 mmol/L (ref 96–112)
CO2: 27 mmol/L (ref 19–32)
Creatinine, Ser: 1.66 mg/dL — ABNORMAL HIGH (ref 0.50–1.10)
GFR calc Af Amer: 30 mL/min — ABNORMAL LOW (ref >90)
GFR calc non Af Amer: 26 mL/min — ABNORMAL LOW (ref >90)
Glucose, Bld: 135 mg/dL — ABNORMAL HIGH (ref 70–99)
Potassium: 3.4 mmol/L — ABNORMAL LOW (ref 3.5–5.1)
Sodium: 143 mmol/L (ref 135–145)
Total Protein: 6.9 g/dL (ref 6.0–8.3)

## 2014-08-13 LAB — LIPASE, BLOOD: Lipase: 48 U/L (ref 11–59)

## 2014-08-13 LAB — CBC WITH DIFFERENTIAL/PLATELET
Basophils Absolute: 0.1 10*3/uL (ref 0.0–0.1)
Basophils Relative: 1 % (ref 0–1)
EOS PCT: 1 % (ref 0–5)
Eosinophils Absolute: 0.1 10*3/uL (ref 0.0–0.7)
HCT: 43 % (ref 36.0–46.0)
Hemoglobin: 13.9 g/dL (ref 12.0–15.0)
LYMPHS ABS: 2.2 10*3/uL (ref 0.7–4.0)
LYMPHS PCT: 24 % (ref 12–46)
MCH: 30.5 pg (ref 26.0–34.0)
MCHC: 32.3 g/dL (ref 30.0–36.0)
MCV: 94.5 fL (ref 78.0–100.0)
Monocytes Absolute: 0.5 10*3/uL (ref 0.1–1.0)
Monocytes Relative: 6 % (ref 3–12)
Neutro Abs: 6.3 10*3/uL (ref 1.7–7.7)
Neutrophils Relative %: 68 % (ref 43–77)
PLATELETS: 193 10*3/uL (ref 150–400)
RBC: 4.55 MIL/uL (ref 3.87–5.11)
RDW: 13.1 % (ref 11.5–15.5)
WBC: 9.2 10*3/uL (ref 4.0–10.5)

## 2014-08-13 LAB — I-STAT TROPONIN, ED: Troponin i, poc: 0.01 ng/mL (ref 0.00–0.08)

## 2014-08-13 MED ORDER — ONDANSETRON HCL 4 MG/2ML IJ SOLN: 4.0000 mg | Freq: Once | INTRAMUSCULAR | Status: DC

## 2014-08-13 MED ORDER — MORPHINE SULFATE 4 MG/ML IJ SOLN
4.0000 mg | Freq: Once | INTRAMUSCULAR | Status: AC
  Administered 2014-08-13: 4 mg via INTRAVENOUS
  Filled 2014-08-13: qty 1

## 2014-08-13 MED ORDER — ONDANSETRON HCL 4 MG/2ML IJ SOLN
4.0000 mg | Freq: Once | INTRAMUSCULAR | Status: AC
  Administered 2014-08-13: 4 mg via INTRAVENOUS
  Filled 2014-08-13: qty 2

## 2014-08-13 MED ORDER — IOHEXOL 300 MG/ML  SOLN
25.0000 mL | Freq: Once | INTRAMUSCULAR | Status: AC | PRN
  Administered 2014-08-13: 25 mL via ORAL

## 2014-08-13 MED ORDER — SODIUM CHLORIDE 0.9 % IV SOLN
INTRAVENOUS | Status: DC
  Administered 2014-08-13 – 2014-08-16 (×4): via INTRAVENOUS

## 2014-08-13 MED ORDER — IOHEXOL 300 MG/ML  SOLN
80.0000 mL | Freq: Once | INTRAMUSCULAR | Status: AC | PRN
  Administered 2014-08-13: 80 mL via INTRAVENOUS

## 2014-08-13 MED ORDER — MORPHINE SULFATE 4 MG/ML IJ SOLN
2.0000 mg | Freq: Once | INTRAMUSCULAR | Status: AC
  Administered 2014-08-13: 2 mg via INTRAVENOUS
  Filled 2014-08-13 (×2): qty 1

## 2014-08-13 NOTE — ED Notes (Signed)
Patient transported to CT 

## 2014-08-13 NOTE — ED Notes (Signed)
Pt presents to ED via EMS C/O abd pain as well as obvious abd distension. Pt at family reunion this am, changed colostomy bag at 80 and has not had any drainage since then. Vomiting started recently.

## 2014-08-13 NOTE — ED Notes (Signed)
Pt changed into a clean gown

## 2014-08-13 NOTE — ED Provider Notes (Signed)
CSN: [id number]     Arrival date & time 08/13/14  2122 History   First MD Initiated Contact with Patient 08/13/14 2125     Chief Complaint  Patient presents with  . Bloated  . Emesis     (Consider location/radiation/quality/duration/timing/severity/associated sxs/prior Treatment) The history is provided by the patient and medical records.    This is a 81 year old female with past medical history significant for hyperlipidemia, hypertension, history of colon cancer status post colonic resection with colostomy, presenting to the ED for abdominal pain.  Patient states she changed her colostomy bag earlier today around 1000 and went to a family reunion this afternoon. She states she was able to eat and drink normally at this time. She states upon returning home later this evening she developed some abdominal pain followed by vomiting which began approximately 1 hour prior to arrival. Patient states she has generalized pain in her abdomen feels "bloated". She denies any fever or chills. No urinary symptoms.  Patient has not had any output in her colostomy since earlier this morning. She states her output varies on a day-to-day basis, but it is not all that abnormal for her to have limited output for a brief period of time.  Past Medical History  Diagnosis Date  . CARCINOMA, COLON 02/03/2007  . HYPERLIPIDEMIA 06/19/2007  . Unspecified essential hypertension 02/03/2007  . HYPOTENSION, ORTHOSTATIC 12/24/2007  . PANCREATITIS, ACUTE 12/27/2007  . RENAL FAILURE, ACUTE 12/27/2007  . KIDNEY STONE 02/04/2007  . 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 Lumbago 03/13/2007  . SYNCOPE 03/13/2007  . Dysuria 02/04/2007  . NEPHROLITHIASIS, HX OF 06/19/2007  . LEG PAIN, LEFT 05/14/2010   Past Surgical History  Procedure Laterality Date  . Colon surgery  2002  . Cystoscopy     Family History  Problem Relation Age of Onset  . Cirrhosis Mother 1    nonalcholic  . Leukemia Father    History  Substance Use  Topics  . Smoking status: Never Smoker   . Smokeless tobacco: Never Used  . Alcohol Use: No   OB History    No data available     Review of Systems  Gastrointestinal: Positive for nausea, vomiting, abdominal pain and abdominal distention.  All other systems reviewed and are negative.     Allergies  Amoxicillin-pot clavulanate  Home Medications   Prior to Admission medications   Medication Sig Start Date End Date Taking? Authorizing Provider  acetaminophen (TYLENOL) 500 MG tablet Take 500 mg by mouth every 6 (six) hours as needed.    Historical Provider, MD  amLODipine (NORVASC) 5 MG tablet TAKE ONE TABLET BY MOUTH ONCE DAILY. 09/27/13   Marletta Lor, MD  calcium-vitamin D (OSCAL WITH D) 500-200 MG-UNIT per tablet Take 1 tablet by mouth daily.      Historical Provider, MD  Flaxseed, Linseed, (FLAXSEED OIL) 1000 MG CAPS Take 2 capsules by mouth 2 (two) times daily.    Historical Provider, MD  metoprolol (LOPRESSOR) 50 MG tablet TAKE ONE-HALF TABLET BY MOUTH TWICE DAILY. 09/27/13   Marletta Lor, MD  Multiple Vitamins-Minerals (ONE-A-DAY WOMENS 50 PLUS PO) Take 1 tablet by mouth daily.    Historical Provider, MD  Omega-3 Fatty Acids (FISH OIL) 1200 MG CAPS Take by mouth daily.      Historical Provider, MD  Ostomy Supplies ([place]) [facility] MISC 1 each by Does not apply route as needed. 09/06/11   Marletta Lor, MD   BP 185/82 mmHg  Pulse 79  Temp(Src) 97.8 F (36.6 C) (Oral)  Resp 21  Ht 5\' 6"  (1.676 m)  Wt 170 lb (77.111 kg)  BMI 27.45 kg/m2  SpO2 92%   Physical Exam  Constitutional: She is oriented to person, place, and time. She appears well-developed and well-nourished.  HENT:  Head: Normocephalic and atraumatic.  Mouth/Throat: Oropharynx is clear and moist.  Eyes: Conjunctivae and EOM are normal. Pupils are equal, round, and reactive to light.  Neck: Normal range of motion.  Cardiovascular: Normal rate, regular rhythm and normal heart  sounds.   Pulmonary/Chest: Effort normal and breath sounds normal.  Abdominal: Soft. Bowel sounds are normal. She exhibits distension. There is generalized tenderness.  Abdomen is distended with generalized tenderness, colostomy right abdominal wall without output  Musculoskeletal: Normal range of motion.  Neurological: She is alert and oriented to person, place, and time.  Skin: Skin is warm and dry.  Psychiatric: She has a normal mood and affect.  Nursing note and vitals reviewed.   ED Course  Procedures (including critical care time) Labs Review Labs Reviewed  COMPREHENSIVE METABOLIC PANEL - Abnormal; Notable for the following:    Potassium 3.4 (*)    Glucose, Bld 135 (*)    Creatinine, Ser 1.66 (*)    GFR calc non Af Amer 26 (*)    GFR calc Af Amer 30 (*)    All other components within normal limits  CBC WITH DIFFERENTIAL/PLATELET  LIPASE, BLOOD  URINALYSIS, ROUTINE W REFLEX MICROSCOPIC  I-STAT TROPOININ, ED  I-STAT CG4 LACTIC ACID, ED    Imaging Review Ct Abdomen Pelvis W Contrast  08/14/2014   CLINICAL DATA:  Abdominal distension. Nausea and vomiting. Colostomy in place. No output today. Concern for small bowel obstruction. Colon cancer  EXAM: CT ABDOMEN AND PELVIS WITH CONTRAST  TECHNIQUE: Multidetector CT imaging of the abdomen and pelvis was performed using the standard protocol following bolus administration of intravenous contrast.  CONTRAST:  20mL OMNIPAQUE IOHEXOL 300 MG/ML  SOLN  COMPARISON:  07/15/2010  FINDINGS: Lower chest: Bibasilar volume loss. Moderate cardiomegaly. No pericardial or pleural effusion.  Hepatobiliary: Old granulomatous disease in the liver. Left hepatic lobe cyst. Cholelithiasis without acute cholecystitis or biliary ductal dilatation.  Pancreas: Normal, without mass or ductal dilatation.  Spleen: Normal  Adrenals/Urinary Tract: Normal adrenal glands. Too small to characterize lesions in the left kidney. Persistent, slightly increased right renal  atrophy. Interpolar right renal cyst. Moderate right hydroureter continues to the level of an abrupt transition in the right hemipelvis which is similar and likely due to a stricture. Normal urinary bladder.  Stomach/Bowel: Normal stomach, without wall thickening.  Surgical sutures in the rectosigmoid junction. Small bowel is normal in caliber. A right-sided colostomy is identified. Parastomal hernia again identified containing right-sided colon. There is mild dilatation of the herniated colon within the parastomal hernia. No wall thickening or mesenteric edema seen within the herniated loops. Gas bubbles are seen along the dependent wall of the cecum, just proximal to the parastomal hernia. Example image 58 of series 201. There adjacent loculated gas which are favored to be within small bowel diverticula on image 63. Minimal fluid is seen within the small bowel mesenteric on image 62.  Vascular/Lymphatic: Aortic and branch vessel atherosclerosis. No abdominopelvic adenopathy.  Reproductive: Normal uterus and adnexa.  Other: No significant free fluid.  Musculoskeletal: Convex right lumbar spine curvature.  IMPRESSION: 1. Right-sided colostomy with parastomal hernia containing colon. The colon within the parastomal hernia is newly prominent. Cannot exclude partial colonic obstruction.  No small bowel dilatation more proximally to suggest propagation of obstruction. 2. Gas within the dependent cecum, just proximal to the parastomal hernia. Favored to be due to fluid mixed with gas within the lumen. Small volume pneumatosis cannot be excluded. Correlate with lactate levels. 3. Chronic right-sided ureteric stricture with secondary right renal atrophy. 4. Cholelithiasis.   Electronically Signed   By: Abigail Miyamoto M.D.   On: 08/14/2014 00:38     EKG Interpretation None      MDM   Final diagnoses:  Abdominal distention  Abdominal pain, unspecified abdominal location  Nausea and vomiting, vomiting of unspecified  type   51 -year-old female here with abdominal distention, nausea, vomiting, and decreased output from her colostomy. On exam patient is afebrile and nontoxic in appearance. Her abdomen is distended with generalized tenderness. Clinical concern for small bowel traction. Lab work and CT will be obtained for further evaluation. Patient given small bolus of IV fluids and morphine for pain. Zofran given for nausea with resolution of vomiting.  12:52 AM Labwork is overall reassuring. UA pending. CT scan revealing parastomal hernia surrounding right colostomy. Questionable partial colonic obstruction and questionable pneumatosis.  Case discussed with on call surgery, Dr. Rosendo Gros-- recommends to try to reduce hernia at bedside which i have done without success.  Attending physician, Dr. Claudine Mouton has also tried without success.  Dr. Rosendo Gros to evaluate in the ED and determine plan of care.  1:14 AM  Care signed out to Dr. Claudine Mouton at this time.    Larene Pickett, PA-C 08/14/14 [id number]  Everlene Balls, MD 08/14/14 [phone]

## 2014-08-14 DIAGNOSIS — N183 Chronic kidney disease, stage 3 (moderate): Secondary | ICD-10-CM | POA: Diagnosis not present

## 2014-08-14 DIAGNOSIS — K5669 Other intestinal obstruction: Secondary | ICD-10-CM

## 2014-08-14 DIAGNOSIS — R109 Unspecified abdominal pain: Secondary | ICD-10-CM | POA: Insufficient documentation

## 2014-08-14 DIAGNOSIS — K436 Other and unspecified ventral hernia with obstruction, without gangrene: Secondary | ICD-10-CM | POA: Diagnosis not present

## 2014-08-14 DIAGNOSIS — K5660 Unspecified intestinal obstruction: Secondary | ICD-10-CM | POA: Diagnosis present

## 2014-08-14 DIAGNOSIS — R112 Nausea with vomiting, unspecified: Secondary | ICD-10-CM | POA: Diagnosis not present

## 2014-08-14 DIAGNOSIS — Z881 Allergy status to other antibiotic agents status: Secondary | ICD-10-CM | POA: Diagnosis not present

## 2014-08-14 DIAGNOSIS — R140 Abdominal distension (gaseous): Secondary | ICD-10-CM | POA: Diagnosis present

## 2014-08-14 DIAGNOSIS — Z9049 Acquired absence of other specified parts of digestive tract: Secondary | ICD-10-CM | POA: Diagnosis present

## 2014-08-14 DIAGNOSIS — I10 Essential (primary) hypertension: Secondary | ICD-10-CM

## 2014-08-14 DIAGNOSIS — K433 Parastomal hernia with obstruction, without gangrene: Secondary | ICD-10-CM | POA: Diagnosis present

## 2014-08-14 DIAGNOSIS — I951 Orthostatic hypotension: Secondary | ICD-10-CM | POA: Diagnosis present

## 2014-08-14 DIAGNOSIS — Z87442 Personal history of urinary calculi: Secondary | ICD-10-CM | POA: Diagnosis not present

## 2014-08-14 DIAGNOSIS — E876 Hypokalemia: Secondary | ICD-10-CM | POA: Diagnosis present

## 2014-08-14 DIAGNOSIS — Z933 Colostomy status: Secondary | ICD-10-CM | POA: Diagnosis not present

## 2014-08-14 DIAGNOSIS — E785 Hyperlipidemia, unspecified: Secondary | ICD-10-CM | POA: Diagnosis not present

## 2014-08-14 DIAGNOSIS — Z85038 Personal history of other malignant neoplasm of large intestine: Secondary | ICD-10-CM | POA: Diagnosis not present

## 2014-08-14 DIAGNOSIS — K56609 Unspecified intestinal obstruction, unspecified as to partial versus complete obstruction: Secondary | ICD-10-CM | POA: Diagnosis present

## 2014-08-14 DIAGNOSIS — D649 Anemia, unspecified: Secondary | ICD-10-CM | POA: Diagnosis present

## 2014-08-14 DIAGNOSIS — N179 Acute kidney failure, unspecified: Secondary | ICD-10-CM | POA: Diagnosis present

## 2014-08-14 DIAGNOSIS — C189 Malignant neoplasm of colon, unspecified: Secondary | ICD-10-CM | POA: Diagnosis present

## 2014-08-14 DIAGNOSIS — R1084 Generalized abdominal pain: Secondary | ICD-10-CM | POA: Diagnosis not present

## 2014-08-14 DIAGNOSIS — I129 Hypertensive chronic kidney disease with stage 1 through stage 4 chronic kidney disease, or unspecified chronic kidney disease: Secondary | ICD-10-CM | POA: Diagnosis present

## 2014-08-14 LAB — URINALYSIS, ROUTINE W REFLEX MICROSCOPIC
Bilirubin Urine: NEGATIVE
GLUCOSE, UA: NEGATIVE mg/dL
Hgb urine dipstick: NEGATIVE
KETONES UR: NEGATIVE mg/dL
Leukocytes, UA: NEGATIVE
Nitrite: NEGATIVE
PROTEIN: 30 mg/dL — AB
Specific Gravity, Urine: 1.02 (ref 1.005–1.030)
UROBILINOGEN UA: 0.2 mg/dL (ref 0.0–1.0)
pH: 5 (ref 5.0–8.0)

## 2014-08-14 LAB — BASIC METABOLIC PANEL
ANION GAP: 11 (ref 5–15)
BUN: 14 mg/dL (ref 6–20)
CO2: 25 mmol/L (ref 22–32)
CREATININE: 1.56 mg/dL — AB (ref 0.44–1.00)
Calcium: 8.9 mg/dL (ref 8.9–10.3)
Chloride: 106 mmol/L (ref 101–111)
GFR calc non Af Amer: 28 mL/min — ABNORMAL LOW (ref >60)
GFR, EST AFRICAN AMERICAN: 32 mL/min — AB (ref >60)
Glucose, Bld: 141 mg/dL — ABNORMAL HIGH (ref 70–99)
Potassium: 4 mmol/L (ref 3.5–5.1)
Sodium: 142 mmol/L (ref 135–145)

## 2014-08-14 LAB — CBC
HCT: 39.1 % (ref 36.0–46.0)
Hemoglobin: 12.6 g/dL (ref 12.0–15.0)
MCH: 30.4 pg (ref 26.0–34.0)
MCHC: 32.2 g/dL (ref 30.0–36.0)
MCV: 94.2 fL (ref 78.0–100.0)
Platelets: 171 10*3/uL (ref 150–400)
RBC: 4.15 MIL/uL (ref 3.87–5.11)
RDW: 13.1 % (ref 11.5–15.5)
WBC: 14.5 10*3/uL — ABNORMAL HIGH (ref 4.0–10.5)

## 2014-08-14 LAB — URINE MICROSCOPIC-ADD ON

## 2014-08-14 MED ORDER — HYDROMORPHONE HCL 1 MG/ML IJ SOLN
0.5000 mg | INTRAMUSCULAR | Status: DC | PRN
  Administered 2014-08-14: 1 mg via INTRAVENOUS
  Administered 2014-08-14 – 2014-08-16 (×2): 0.5 mg via INTRAVENOUS
  Filled 2014-08-14 (×3): qty 1

## 2014-08-14 MED ORDER — CETYLPYRIDINIUM CHLORIDE 0.05 % MT LIQD
7.0000 mL | Freq: Two times a day (BID) | OROMUCOSAL | Status: DC
  Administered 2014-08-14 – 2014-08-17 (×6): 7 mL via OROMUCOSAL

## 2014-08-14 MED ORDER — ACETAMINOPHEN 325 MG PO TABS
650.0000 mg | ORAL_TABLET | Freq: Four times a day (QID) | ORAL | Status: DC | PRN
  Administered 2014-08-15: 650 mg via ORAL
  Filled 2014-08-14: qty 2

## 2014-08-14 MED ORDER — MORPHINE SULFATE 4 MG/ML IJ SOLN
4.0000 mg | INTRAMUSCULAR | Status: AC | PRN
  Administered 2014-08-14: 2 mg via INTRAVENOUS
  Filled 2014-08-14: qty 1

## 2014-08-14 MED ORDER — COLOPLAST DRAINABLE POUCH MISC: 1.0000 | Status: DC | PRN

## 2014-08-14 MED ORDER — MORPHINE SULFATE 2 MG/ML IJ SOLN: 2.0000 mg | INTRAMUSCULAR | Status: DC | PRN

## 2014-08-14 MED ORDER — SODIUM CHLORIDE 0.9 % IV SOLN
INTRAVENOUS | Status: DC
  Administered 2014-08-14 (×3): via INTRAVENOUS

## 2014-08-14 MED ORDER — ONDANSETRON HCL 4 MG PO TABS: 4.0000 mg | ORAL_TABLET | Freq: Four times a day (QID) | ORAL | Status: DC | PRN

## 2014-08-14 MED ORDER — ONDANSETRON HCL 4 MG/2ML IJ SOLN: 4.0000 mg | Freq: Four times a day (QID) | INTRAMUSCULAR | Status: DC | PRN

## 2014-08-14 MED ORDER — ALUM & MAG HYDROXIDE-SIMETH 200-200-20 MG/5ML PO SUSP: 30.0000 mL | Freq: Four times a day (QID) | ORAL | Status: DC | PRN

## 2014-08-14 MED ORDER — MORPHINE SULFATE 4 MG/ML IJ SOLN
4.0000 mg | Freq: Once | INTRAMUSCULAR | Status: AC
  Administered 2014-08-14: 4 mg via INTRAVENOUS
  Filled 2014-08-14: qty 1

## 2014-08-14 MED ORDER — ACETAMINOPHEN 650 MG RE SUPP: 650.0000 mg | Freq: Four times a day (QID) | RECTAL | Status: DC | PRN

## 2014-08-14 NOTE — ED Notes (Signed)
Report given to Lordis RN on 6N

## 2014-08-14 NOTE — Consult Note (Signed)
Reason for Consult: Incarcerated parastomal hernia Referring Physician: Dr. Delma Officer is an 81 y.o. female.  HPI: Patient is a 81 year old female who comes in today secondary to a incarcerated parastomal hernia. According to the patient and her daughters the patient was at a family reunion earlier today without an issue. They state that this evening patient had some peristomal pain. She states that the bulge appeared tight and secondary to this she came to the ER for further evaluation.  Patient had a colostomy placed approximately 10 years ago after what appears to be lysed the adhesions and enterotomy. This is as reported by the patient's daughter. The patient had a previous colon resection for carcinoma in the past. The patient and her family are unaware of which portion of the colon got resected.  Past Medical History  Diagnosis Date  . CARCINOMA, COLON 02/03/2007  . HYPERLIPIDEMIA 06/19/2007  . Unspecified essential hypertension 02/03/2007  . HYPOTENSION, ORTHOSTATIC 12/24/2007  . PANCREATITIS, ACUTE 12/27/2007  . RENAL FAILURE, ACUTE 12/27/2007  . KIDNEY STONE 02/04/2007  . 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 Lumbago 03/13/2007  . SYNCOPE 03/13/2007  . Dysuria 02/04/2007  . NEPHROLITHIASIS, HX OF 06/19/2007  . LEG PAIN, LEFT 05/14/2010    Past Surgical History  Procedure Laterality Date  . Colon surgery  2002  . Cystoscopy      Family History  Problem Relation Age of Onset  . Cirrhosis Mother 16    nonalcholic  . Leukemia Father     Social History:  reports that she has never smoked. She has never used smokeless tobacco. She reports that she does not drink alcohol or use illicit drugs.  Allergies:  Allergies  Allergen Reactions  . Amoxicillin-Pot Clavulanate     REACTION: hives    Medications: I have reviewed the patient's current medications.  Results for orders placed or performed during the hospital encounter of 08/13/14 (from the past 48 hour(s))    CBC with Differential     Status: None   Collection Time: 08/13/14  9:41 PM  Result Value Ref Range   WBC 9.2 4.0 - 10.5 K/uL   RBC 4.55 3.87 - 5.11 MIL/uL   Hemoglobin 13.9 12.0 - 15.0 g/dL   HCT 43.0 36.0 - 46.0 %   MCV 94.5 78.0 - 100.0 fL   MCH 30.5 26.0 - 34.0 pg   MCHC 32.3 30.0 - 36.0 g/dL   RDW 13.1 11.5 - 15.5 %   Platelets 193 150 - 400 K/uL   Neutrophils Relative % 68 43 - 77 %   Neutro Abs 6.3 1.7 - 7.7 K/uL   Lymphocytes Relative 24 12 - 46 %   Lymphs Abs 2.2 0.7 - 4.0 K/uL   Monocytes Relative 6 3 - 12 %   Monocytes Absolute 0.5 0.1 - 1.0 K/uL   Eosinophils Relative 1 0 - 5 %   Eosinophils Absolute 0.1 0.0 - 0.7 K/uL   Basophils Relative 1 0 - 1 %   Basophils Absolute 0.1 0.0 - 0.1 K/uL  Comprehensive metabolic panel     Status: Abnormal   Collection Time: 08/13/14  9:41 PM  Result Value Ref Range   Sodium 143 135 - 145 mmol/L   Potassium 3.4 (L) 3.5 - 5.1 mmol/L   Chloride 105 96 - 112 mmol/L   CO2 27 19 - 32 mmol/L   Glucose, Bld 135 (H) 70 - 99 mg/dL   BUN 14 6 - 23 mg/dL   Creatinine, Ser  1.66 (H) 0.50 - 1.10 mg/dL   Calcium 9.7 8.4 - 10.5 mg/dL   Total Protein 6.9 6.0 - 8.3 g/dL   Albumin 3.8 3.5 - 5.2 g/dL   AST 21 0 - 37 U/L   ALT 14 0 - 35 U/L   Alkaline Phosphatase 64 39 - 117 U/L   Total Bilirubin 0.6 0.3 - 1.2 mg/dL   GFR calc non Af Amer 26 (L) >90 mL/min   GFR calc Af Amer 30 (L) >90 mL/min    Comment: (NOTE) The eGFR has been calculated using the CKD EPI equation. This calculation has not been validated in all clinical situations. eGFR's persistently <90 mL/min signify possible Chronic Kidney Disease.    Anion gap 11 5 - 15  Lipase, blood     Status: None   Collection Time: 08/13/14  9:41 PM  Result Value Ref Range   Lipase 48 11 - 59 U/L  I-stat troponin, ED     Status: None   Collection Time: 08/13/14 10:03 PM  Result Value Ref Range   Troponin i, poc 0.01 0.00 - 0.08 ng/mL   Comment 3            Comment: Due to the release  kinetics of cTnI, a negative result within the first hours of the onset of symptoms does not rule out myocardial infarction with certainty. If myocardial infarction is still suspected, repeat the test at appropriate intervals.   Urinalysis, Routine w reflex microscopic     Status: Abnormal   Collection Time: 08/14/14 12:55 AM  Result Value Ref Range   Color, Urine YELLOW YELLOW   APPearance CLEAR CLEAR   Specific Gravity, Urine 1.020 1.005 - 1.030   pH 5.0 5.0 - 8.0   Glucose, UA NEGATIVE NEGATIVE mg/dL   Hgb urine dipstick NEGATIVE NEGATIVE   Bilirubin Urine NEGATIVE NEGATIVE   Ketones, ur NEGATIVE NEGATIVE mg/dL   Protein, ur 30 (A) NEGATIVE mg/dL   Urobilinogen, UA 0.2 0.0 - 1.0 mg/dL   Nitrite NEGATIVE NEGATIVE   Leukocytes, UA NEGATIVE NEGATIVE  Urine microscopic-add on     Status: Abnormal   Collection Time: 08/14/14 12:55 AM  Result Value Ref Range   Squamous Epithelial / LPF FEW (A) RARE   WBC, UA 0-2 <3 WBC/hpf   RBC / HPF 0-2 <3 RBC/hpf   Bacteria, UA MANY (A) RARE   Urine-Other LESS THAN 10 mL OF URINE SUBMITTED     Ct Abdomen Pelvis W Contrast  08/14/2014   CLINICAL DATA:  Abdominal distension. Nausea and vomiting. Colostomy in place. No output today. Concern for small bowel obstruction. Colon cancer  EXAM: CT ABDOMEN AND PELVIS WITH CONTRAST  TECHNIQUE: Multidetector CT imaging of the abdomen and pelvis was performed using the standard protocol following bolus administration of intravenous contrast.  CONTRAST:  13m OMNIPAQUE IOHEXOL 300 MG/ML  SOLN  COMPARISON:  07/15/2010  FINDINGS: Lower chest: Bibasilar volume loss. Moderate cardiomegaly. No pericardial or pleural effusion.  Hepatobiliary: Old granulomatous disease in the liver. Left hepatic lobe cyst. Cholelithiasis without acute cholecystitis or biliary ductal dilatation.  Pancreas: Normal, without mass or ductal dilatation.  Spleen: Normal  Adrenals/Urinary Tract: Normal adrenal glands. Too small to characterize  lesions in the left kidney. Persistent, slightly increased right renal atrophy. Interpolar right renal cyst. Moderate right hydroureter continues to the level of an abrupt transition in the right hemipelvis which is similar and likely due to a stricture. Normal urinary bladder.  Stomach/Bowel: Normal stomach, without wall thickening.  Surgical sutures in the rectosigmoid junction. Small bowel is normal in caliber. A right-sided colostomy is identified. Parastomal hernia again identified containing right-sided colon. There is mild dilatation of the herniated colon within the parastomal hernia. No wall thickening or mesenteric edema seen within the herniated loops. Gas bubbles are seen along the dependent wall of the cecum, just proximal to the parastomal hernia. Example image 58 of series 201. There adjacent loculated gas which are favored to be within small bowel diverticula on image 63. Minimal fluid is seen within the small bowel mesenteric on image 62.  Vascular/Lymphatic: Aortic and branch vessel atherosclerosis. No abdominopelvic adenopathy.  Reproductive: Normal uterus and adnexa.  Other: No significant free fluid.  Musculoskeletal: Convex right lumbar spine curvature.  IMPRESSION: 1. Right-sided colostomy with parastomal hernia containing colon. The colon within the parastomal hernia is newly prominent. Cannot exclude partial colonic obstruction. No small bowel dilatation more proximally to suggest propagation of obstruction. 2. Gas within the dependent cecum, just proximal to the parastomal hernia. Favored to be due to fluid mixed with gas within the lumen. Small volume pneumatosis cannot be excluded. Correlate with lactate levels. 3. Chronic right-sided ureteric stricture with secondary right renal atrophy. 4. Cholelithiasis.   Electronically Signed   By: Abigail Miyamoto M.D.   On: 08/14/2014 00:38    Review of Systems  Constitutional: Negative for weight loss.  HENT: Negative for ear discharge, ear pain,  hearing loss and tinnitus.   Eyes: Negative for blurred vision, double vision, photophobia and pain.  Respiratory: Negative for cough, sputum production and shortness of breath.   Cardiovascular: Negative for chest pain.  Gastrointestinal: Positive for nausea and abdominal pain. Negative for vomiting.  Genitourinary: Negative for dysuria, urgency, frequency and flank pain.  Musculoskeletal: Negative for myalgias, back pain, joint pain, falls and neck pain.  Neurological: Negative for dizziness, tingling, sensory change, focal weakness, loss of consciousness and headaches.  Endo/Heme/Allergies: Does not bruise/bleed easily.  Psychiatric/Behavioral: Negative for depression, memory loss and substance abuse. The patient is not nervous/anxious.    Blood pressure 162/82, pulse 92, temperature 97.8 F (36.6 C), temperature source Oral, resp. rate 23, height 5' 6" (1.676 m), weight 77.111 kg (170 lb), SpO2 96 %. Physical Exam  Constitutional: She is oriented to person, place, and time. She appears well-developed and well-nourished.  HENT:  Head: Normocephalic and atraumatic.  Eyes: EOM are normal. Pupils are equal, round, and reactive to light.  Neck: Normal range of motion. Neck supple.  Cardiovascular: Normal rate, regular rhythm and normal heart sounds.   Respiratory: Effort normal and breath sounds normal.  GI: Soft. There is tenderness (at ostomy site). There is no rebound.    Parastomal hernia, reducible  Musculoskeletal: Normal range of motion.  Neurological: She is alert and oriented to person, place, and time.    Assessment/Plan: 81 year old female with a reducible parastomal hernia. I had a long discussion with the patient as well as her daughter in regard to the fact that the hernia is not reduced. There is a high likelihood that the hernia will become incarcerated in the future. At this point we could proceed with urgent/elective repair versus observation if the patient and her  family do not wish for surgery. I will discuss the patient with my partner Dr. Grandville Silos.  1. Recommend medical admission. 2. Nothing by mouth, IV fluids 3. Ostomy appliance applied and we will reassess patient's ostomy in the a.m.  Rosario Jacks., Lamiya Naas 08/14/2014, 2:42 AM

## 2014-08-14 NOTE — ED Notes (Signed)
Pts family departs for home

## 2014-08-14 NOTE — Progress Notes (Signed)
81 year old lady with h/o colon cancer s/p resection , come sin for abdominal pain and nausea, vomiting and abdominal distention. She was found to have paracolostomy hernia contaning colon, which couldn't be reduced. She would need surgery soon. Surgery consulted and recommendations given.  She was admitted earlier by Dr Arnoldo Morale please see her note for detailed H&P.  She is alert and comfortable this am . No new complaints. Spoke brieflywith her granddaughter outside the room.   Hosie Poisson, MD [phone]

## 2014-08-14 NOTE — Progress Notes (Signed)
Patient ID: [name], female   DOB: [birth year], 81 y.o.   MRN: [id number] [facility] Surgery Progress Note:   * No surgery found *  Subjective: Mental status appears clear for her age.  No acute distress Objective: Vital signs in last 24 hours: Temp:  [97.8 F (36.6 C)-98.7 F (37.1 C)] 98.4 F (36.9 C) (05/01 0652) Pulse Rate:  [76-111] 111 (05/01 0652) Resp:  [14-28] 16 (05/01 0652) BP: (141-188)/(65-105) 160/76 mmHg (05/01 0652) SpO2:  [88 %-96 %] 95 % (05/01 0652) Weight:  [77.111 kg (170 lb)-79.6 kg (175 lb 7.8 oz)] 79.6 kg (175 lb 7.8 oz) (05/01 0349)  Intake/Output from previous day: 04/30 0701 - 05/01 0700 In: 46.7 [I.V.:46.7] Out: -  Intake/Output this shift:    Physical Exam: Work of breathing is not labored.  Paracolostomy hernia containing colon laterally Digital exam with 5th digit easily goes beneath the fascia with small amounts of gas released; no stool or impaction.  It could be that retrograde decompression may be attempted short of operative intervention.  Daughter is not here at the present to discuss.    Lab Results:  Results for orders placed or performed during the hospital encounter of 08/13/14 (from the past 48 hour(s))  CBC with Differential     Status: None   Collection Time: 08/13/14  9:41 PM  Result Value Ref Range   WBC 9.2 4.0 - 10.5 K/uL   RBC 4.55 3.87 - 5.11 MIL/uL   Hemoglobin 13.9 12.0 - 15.0 g/dL   HCT 43.0 36.0 - 46.0 %   MCV 94.5 78.0 - 100.0 fL   MCH 30.5 26.0 - 34.0 pg   MCHC 32.3 30.0 - 36.0 g/dL   RDW 13.1 11.5 - 15.5 %   Platelets 193 150 - 400 K/uL   Neutrophils Relative % 68 43 - 77 %   Neutro Abs 6.3 1.7 - 7.7 K/uL   Lymphocytes Relative 24 12 - 46 %   Lymphs Abs 2.2 0.7 - 4.0 K/uL   Monocytes Relative 6 3 - 12 %   Monocytes Absolute 0.5 0.1 - 1.0 K/uL   Eosinophils Relative 1 0 - 5 %   Eosinophils Absolute 0.1 0.0 - 0.7 K/uL   Basophils Relative 1 0 - 1 %   Basophils Absolute 0.1 0.0 - 0.1 K/uL   Comprehensive metabolic panel     Status: Abnormal   Collection Time: 08/13/14  9:41 PM  Result Value Ref Range   Sodium 143 135 - 145 mmol/L   Potassium 3.4 (L) 3.5 - 5.1 mmol/L   Chloride 105 96 - 112 mmol/L   CO2 27 19 - 32 mmol/L   Glucose, Bld 135 (H) 70 - 99 mg/dL   BUN 14 6 - 23 mg/dL   Creatinine, Ser 1.66 (H) 0.50 - 1.10 mg/dL   Calcium 9.7 8.4 - 10.5 mg/dL   Total Protein 6.9 6.0 - 8.3 g/dL   Albumin 3.8 3.5 - 5.2 g/dL   AST 21 0 - 37 U/L   ALT 14 0 - 35 U/L   Alkaline Phosphatase 64 39 - 117 U/L   Total Bilirubin 0.6 0.3 - 1.2 mg/dL   GFR calc non Af Amer 26 (L) >90 mL/min   GFR calc Af Amer 30 (L) >90 mL/min    Comment: (NOTE) The eGFR has been calculated using the CKD EPI equation. This calculation has not been validated in all clinical situations. eGFR's persistently <90 mL/min signify possible Chronic Kidney Disease.  Anion gap 11 5 - 15  Lipase, blood     Status: None   Collection Time: 08/13/14  9:41 PM  Result Value Ref Range   Lipase 48 11 - 59 U/L  I-stat troponin, ED     Status: None   Collection Time: 08/13/14 10:03 PM  Result Value Ref Range   Troponin i, poc 0.01 0.00 - 0.08 ng/mL   Comment 3            Comment: Due to the release kinetics of cTnI, a negative result within the first hours of the onset of symptoms does not rule out myocardial infarction with certainty. If myocardial infarction is still suspected, repeat the test at appropriate intervals.   Urinalysis, Routine w reflex microscopic     Status: Abnormal   Collection Time: 08/14/14 12:55 AM  Result Value Ref Range   Color, Urine YELLOW YELLOW   APPearance CLEAR CLEAR   Specific Gravity, Urine 1.020 1.005 - 1.030   pH 5.0 5.0 - 8.0   Glucose, UA NEGATIVE NEGATIVE mg/dL   Hgb urine dipstick NEGATIVE NEGATIVE   Bilirubin Urine NEGATIVE NEGATIVE   Ketones, ur NEGATIVE NEGATIVE mg/dL   Protein, ur 30 (A) NEGATIVE mg/dL   Urobilinogen, UA 0.2 0.0 - 1.0 mg/dL   Nitrite NEGATIVE  NEGATIVE   Leukocytes, UA NEGATIVE NEGATIVE  Urine microscopic-add on     Status: Abnormal   Collection Time: 08/14/14 12:55 AM  Result Value Ref Range   Squamous Epithelial / LPF FEW (A) RARE   WBC, UA 0-2 <3 WBC/hpf   RBC / HPF 0-2 <3 RBC/hpf   Bacteria, UA MANY (A) RARE   Urine-Other LESS THAN 10 mL OF URINE SUBMITTED   Basic metabolic panel     Status: Abnormal   Collection Time: 08/14/14  6:00 AM  Result Value Ref Range   Sodium 142 135 - 145 mmol/L   Potassium 4.0 3.5 - 5.1 mmol/L   Chloride 106 101 - 111 mmol/L   CO2 25 22 - 32 mmol/L   Glucose, Bld 141 (H) 70 - 99 mg/dL   BUN 14 6 - 20 mg/dL   Creatinine, Ser 1.56 (H) 0.44 - 1.00 mg/dL   Calcium 8.9 8.9 - 10.3 mg/dL   GFR calc non Af Amer 28 (L) >60 mL/min   GFR calc Af Amer 32 (L) >60 mL/min    Comment: (NOTE) The eGFR has been calculated using the CKD EPI equation. This calculation has not been validated in all clinical situations. eGFR's persistently <90 mL/min signify possible Chronic Kidney Disease.    Anion gap 11 5 - 15  CBC     Status: Abnormal   Collection Time: 08/14/14  6:00 AM  Result Value Ref Range   WBC 14.5 (H) 4.0 - 10.5 K/uL   RBC 4.15 3.87 - 5.11 MIL/uL   Hemoglobin 12.6 12.0 - 15.0 g/dL   HCT 39.1 36.0 - 46.0 %   MCV 94.2 78.0 - 100.0 fL   MCH 30.4 26.0 - 34.0 pg   MCHC 32.2 30.0 - 36.0 g/dL   RDW 13.1 11.5 - 15.5 %   Platelets 171 150 - 400 K/uL    Radiology/Results: Ct Abdomen Pelvis W Contrast  08/14/2014   CLINICAL DATA:  Abdominal distension. Nausea and vomiting. Colostomy in place. No output today. Concern for small bowel obstruction. Colon cancer  EXAM: CT ABDOMEN AND PELVIS WITH CONTRAST  TECHNIQUE: Multidetector CT imaging of the abdomen and pelvis was performed using the  standard protocol following bolus administration of intravenous contrast.  CONTRAST:  9m OMNIPAQUE IOHEXOL 300 MG/ML  SOLN  COMPARISON:  07/15/2010  FINDINGS: Lower chest: Bibasilar volume loss. Moderate  cardiomegaly. No pericardial or pleural effusion.  Hepatobiliary: Old granulomatous disease in the liver. Left hepatic lobe cyst. Cholelithiasis without acute cholecystitis or biliary ductal dilatation.  Pancreas: Normal, without mass or ductal dilatation.  Spleen: Normal  Adrenals/Urinary Tract: Normal adrenal glands. Too small to characterize lesions in the left kidney. Persistent, slightly increased right renal atrophy. Interpolar right renal cyst. Moderate right hydroureter continues to the level of an abrupt transition in the right hemipelvis which is similar and likely due to a stricture. Normal urinary bladder.  Stomach/Bowel: Normal stomach, without wall thickening.  Surgical sutures in the rectosigmoid junction. Small bowel is normal in caliber. A right-sided colostomy is identified. Parastomal hernia again identified containing right-sided colon. There is mild dilatation of the herniated colon within the parastomal hernia. No wall thickening or mesenteric edema seen within the herniated loops. Gas bubbles are seen along the dependent wall of the cecum, just proximal to the parastomal hernia. Example image 58 of series 201. There adjacent loculated gas which are favored to be within small bowel diverticula on image 63. Minimal fluid is seen within the small bowel mesenteric on image 62.  Vascular/Lymphatic: Aortic and branch vessel atherosclerosis. No abdominopelvic adenopathy.  Reproductive: Normal uterus and adnexa.  Other: No significant free fluid.  Musculoskeletal: Convex right lumbar spine curvature.  IMPRESSION: 1. Right-sided colostomy with parastomal hernia containing colon. The colon within the parastomal hernia is newly prominent. Cannot exclude partial colonic obstruction. No small bowel dilatation more proximally to suggest propagation of obstruction. 2. Gas within the dependent cecum, just proximal to the parastomal hernia. Favored to be due to fluid mixed with gas within the lumen. Small  volume pneumatosis cannot be excluded. Correlate with lactate levels. 3. Chronic right-sided ureteric stricture with secondary right renal atrophy. 4. Cholelithiasis.   Electronically Signed   By: KAbigail MiyamotoM.D.   On: 08/14/2014 00:38    Anti-infectives: Anti-infectives    None      Assessment/Plan: Problem List: Patient Active Problem List   Diagnosis Date Noted  . SBO (small bowel obstruction) 08/14/2014  . Nausea with vomiting 08/14/2014  . Abdominal distention 08/14/2014  . Ventral hernia with bowel obstruction 08/14/2014  . AP (abdominal pain)   . CKD (chronic kidney disease) stage 3, GFR 30-59 ml/min 08/05/2014  . LEG PAIN, LEFT 05/14/2010  . PANCREATITIS, ACUTE 12/27/2007  . RENAL FAILURE, ACUTE 12/27/2007  . DIARRHEA, ACUTE 12/27/2007  . HYPOTENSION, ORTHOSTATIC 12/24/2007  . Dyslipidemia 06/19/2007  . NEPHROLITHIASIS, HX OF 06/19/2007  . Lumbago 03/13/2007  . SYNCOPE 03/13/2007  . KIDNEY STONE 02/04/2007  . DYSURIA 02/04/2007  . Malignant neoplasm of colon 02/03/2007  . Essential hypertension 02/03/2007  . 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 Unspecified essential hypertension 02/03/2007    Right sided paracolostomy hernia containing colon;  Unable to reduce at bedside.  May need operative intervention but family not here yet to discuss.   * No surgery found *    LOS: 0 days   Matt B. MHassell Done MD, FWest Tennessee Healthcare D[facility]Sur[facility], [facility] [phone] 08/14/2014 7:34 AM

## 2014-08-14 NOTE — H&P (Signed)
[facility] Admission History and Physical       [name] HWE:[id number] DOB: September 20, 1919 DOA: 08/13/2014  Referring physician:  EDP PCP: Nyoka Cowden, MD  Specialists:   Chief Complaint: ABD Pain And Distention, Nausea and Vomiting  HPI: [name] is a 81 y.o. female with history of Colon Cancer S/P Resection with colostomy, HTN, Hyperlipidemia, CKD Stage IV who presents to the ED with complaints of increased nausea and vomiting and ABD Pain and Distention since 6 pm.   She was evaluated in the ED and found to have an SBO and prominence of bowel protruding inside the ventral hernia on CT scan.    General Surgery was consulted and she was seen by Dr Rosendo Gros who plans to perform surgical repair of the hernia.     Review of Systems:  Constitutional: No Weight Loss, No Weight Gain, Night Sweats, Fevers, Chills, Dizziness, Light Headedness, Fatigue, or Generalized Weakness HEENT: No Headaches, Difficulty Swallowing,Tooth/Dental Problems,Sore Throat,  No Sneezing, Rhinitis, Ear Ache, Nasal Congestion, or Post Nasal Drip,  Cardio-vascular:  No Chest pain, Orthopnea, PND, Edema in Lower Extremities, Anasarca, Dizziness, Palpitations  Resp: No Dyspnea, No DOE, No Productive Cough, No Non-Productive Cough, No Hemoptysis, No Wheezing.    GI: No Heartburn, Indigestion, +Abdominal Pain, +Nausea, +Vomiting, Diarrhea, Constipation, Hematemesis, Hematochezia, Melena, Change in Bowel Habits,  Loss of Appetite  GU: No Dysuria, No Change in Color of Urine, No Urgency or Urinary Frequency, No Flank pain.  Musculoskeletal: No Joint Pain or Swelling, No Decreased Range of Motion, No Back Pain.  Neurologic: No Syncope, No Seizures, Muscle Weakness, Paresthesia, Vision Disturbance or Loss, No Diplopia, No Vertigo, No Difficulty Walking,  Skin: No Rash or Lesions. Psych: No Change in Mood or Affect, No Depression or Anxiety, No Memory loss, No Confusion, or  Hallucinations   Past Medical History  Diagnosis Date  . CARCINOMA, COLON 02/03/2007  . HYPERLIPIDEMIA 06/19/2007  . Unspecified essential hypertension 02/03/2007  . HYPOTENSION, ORTHOSTATIC 12/24/2007  . PANCREATITIS, ACUTE 12/27/2007  . RENAL FAILURE, ACUTE 12/27/2007  . KIDNEY STONE 02/04/2007  . 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 Lumbago 03/13/2007  . SYNCOPE 03/13/2007  . Dysuria 02/04/2007  . NEPHROLITHIASIS, HX OF 06/19/2007  . LEG PAIN, LEFT 05/14/2010     Past Surgical History  Procedure Laterality Date  . Colon surgery  2002  . Cystoscopy        Prior to Admission medications   Medication Sig Start Date End Date Taking? Authorizing Provider  acetaminophen (TYLENOL) 500 MG tablet Take 500 mg by mouth every 6 (six) hours as needed.    Historical Provider, MD  amLODipine (NORVASC) 5 MG tablet TAKE ONE TABLET BY MOUTH ONCE DAILY. 09/27/13   Marletta Lor, MD  calcium-vitamin D (OSCAL WITH D) 500-200 MG-UNIT per tablet Take 1 tablet by mouth daily.      Historical Provider, MD  Flaxseed, Linseed, (FLAXSEED OIL) 1000 MG CAPS Take 2 capsules by mouth 2 (two) times daily.    Historical Provider, MD  metoprolol (LOPRESSOR) 50 MG tablet TAKE ONE-HALF TABLET BY MOUTH TWICE DAILY. 09/27/13   Marletta Lor, MD  Multiple Vitamins-Minerals (ONE-A-DAY WOMENS 50 PLUS PO) Take 1 tablet by mouth daily.    Historical Provider, MD  Omega-3 Fatty Acids (FISH OIL) 1200 MG CAPS Take by mouth daily.      Historical Provider, MD  Ostomy Supplies ([place]) [facility] MISC 1 each by Does not apply route as needed. 09/06/11  Marletta Lor, MD     Allergies  Allergen Reactions  . Amoxicillin-Pot Clavulanate     REACTION: hives    Social History:  reports that she has never smoked. She has never used smokeless tobacco. She reports that she does not drink alcohol or use illicit drugs.    Family History  Problem Relation Age of Onset  . Cirrhosis Mother 66     nonalcholic  . Leukemia Father        Physical Exam:  GEN:  Pleasant Obese Elderly 81 y.o. Caucasian female examined and in no acute distress; cooperative with exam Filed Vitals:   08/14/14 0200 08/14/14 0245 08/14/14 0300 08/14/14 0349  BP: 143/105 143/83 141/65 181/81  Pulse: 104 106 105 106  Temp:    98.7 F (37.1 C)  TempSrc:    Oral  Resp: 18 18 17 16   Height:      Weight:    79.6 kg (175 lb 7.8 oz)  SpO2: 92% 92% 90% 92%   Blood pressure 181/81, pulse 106, temperature 98.7 F (37.1 C), temperature source Oral, resp. rate 16, height 5\' 6"  (1.676 m), weight 79.6 kg (175 lb 7.8 oz), SpO2 92 %. PSYCH: She is alert and oriented x4; does not appear anxious does not appear depressed; affect is normal HEENT: Normocephalic and Atraumatic, Mucous membranes pink; PERRLA; EOM intact; Fundi:  Benign;  No scleral icterus, Nares: Patent, Oropharynx: Clear,    Neck:  FROM, No Cervical Lymphadenopathy nor Thyromegaly or Carotid Bruit; No JVD; Breasts:: Not examined CHEST WALL: No tenderness CHEST: Normal respiration, clear to auscultation bilaterally HEART: Regular rate and rhythm; no murmurs rubs or gallops BACK: No kyphosis or scoliosis; No CVA tenderness ABDOMEN: Decreased Bowel Sounds, +Colostomy Present;  At Midline Large Surgical Scar: Large Ventral Hernia Obese, Soft Non-Tender, No Rebound or Guarding; No Masses, No Organomegaly, No Pannus; No Intertriginous candida. Rectal Exam: Not done EXTREMITIES: No Cyanosis, Clubbing, or Edema; No Ulcerations. Genitalia: not examined PULSES: 2+ and symmetric SKIN: Normal hydration no rash or ulceration CNS:  Alert and Oriented x 4, No Focal Deficits Vascular: pulses palpable throughout    Labs on Admission:  Basic Metabolic Panel:  Recent Labs Lab 08/13/14 2141  NA 143  K 3.4*  CL 105  CO2 27  GLUCOSE 135*  BUN 14  CREATININE 1.66*  CALCIUM 9.7   Liver Function Tests:  Recent Labs Lab 08/13/14 2141  AST 21  ALT 14   ALKPHOS 64  BILITOT 0.6  PROT 6.9  ALBUMIN 3.8    Recent Labs Lab 08/13/14 2141  LIPASE 48   No results for input(s): AMMONIA in the last 168 hours. CBC:  Recent Labs Lab 08/13/14 2141  WBC 9.2  NEUTROABS 6.3  HGB 13.9  HCT 43.0  MCV 94.5  PLT 193   Cardiac Enzymes: No results for input(s): CKTOTAL, CKMB, CKMBINDEX, TROPONINI in the last 168 hours.  BNP (last 3 results) No results for input(s): BNP in the last 8760 hours.  ProBNP (last 3 results)  Recent Labs  08/05/14 1128  PROBNP 60.0    CBG: No results for input(s): GLUCAP in the last 168 hours.  Radiological Exams on Admission: Ct Abdomen Pelvis W Contrast  08/14/2014   CLINICAL DATA:  Abdominal distension. Nausea and vomiting. Colostomy in place. No output today. Concern for small bowel obstruction. Colon cancer  EXAM: CT ABDOMEN AND PELVIS WITH CONTRAST  TECHNIQUE: Multidetector CT imaging of the abdomen and pelvis was performed using the standard  protocol following bolus administration of intravenous contrast.  CONTRAST:  8mL OMNIPAQUE IOHEXOL 300 MG/ML  SOLN  COMPARISON:  07/15/2010  FINDINGS: Lower chest: Bibasilar volume loss. Moderate cardiomegaly. No pericardial or pleural effusion.  Hepatobiliary: Old granulomatous disease in the liver. Left hepatic lobe cyst. Cholelithiasis without acute cholecystitis or biliary ductal dilatation.  Pancreas: Normal, without mass or ductal dilatation.  Spleen: Normal  Adrenals/Urinary Tract: Normal adrenal glands. Too small to characterize lesions in the left kidney. Persistent, slightly increased right renal atrophy. Interpolar right renal cyst. Moderate right hydroureter continues to the level of an abrupt transition in the right hemipelvis which is similar and likely due to a stricture. Normal urinary bladder.  Stomach/Bowel: Normal stomach, without wall thickening.  Surgical sutures in the rectosigmoid junction. Small bowel is normal in caliber. A right-sided colostomy is  identified. Parastomal hernia again identified containing right-sided colon. There is mild dilatation of the herniated colon within the parastomal hernia. No wall thickening or mesenteric edema seen within the herniated loops. Gas bubbles are seen along the dependent wall of the cecum, just proximal to the parastomal hernia. Example image 58 of series 201. There adjacent loculated gas which are favored to be within small bowel diverticula on image 63. Minimal fluid is seen within the small bowel mesenteric on image 62.  Vascular/Lymphatic: Aortic and branch vessel atherosclerosis. No abdominopelvic adenopathy.  Reproductive: Normal uterus and adnexa.  Other: No significant free fluid.  Musculoskeletal: Convex right lumbar spine curvature.  IMPRESSION: 1. Right-sided colostomy with parastomal hernia containing colon. The colon within the parastomal hernia is newly prominent. Cannot exclude partial colonic obstruction. No small bowel dilatation more proximally to suggest propagation of obstruction. 2. Gas within the dependent cecum, just proximal to the parastomal hernia. Favored to be due to fluid mixed with gas within the lumen. Small volume pneumatosis cannot be excluded. Correlate with lactate levels. 3. Chronic right-sided ureteric stricture with secondary right renal atrophy. 4. Cholelithiasis.   Electronically Signed   By: Abigail Miyamoto M.D.   On: 08/14/2014 00:38       Assessment/Plan:   81 y.o. female with  Principal Problem:   1.    SBO (small bowel obstruction)   NPO   IVFs   Pain Control PRN   IV Zofran PRN   General Surgery Consulted Dr Gevena Cotton saw Patient in ED   Active Problems:   2.   Nausea with vomiting- Due to #1   PRN IV Zofran     3.   Abdominal distention- Due to #1        4.   Ventral hernia with bowel obstruction   Plan for Surgical Repair by General Surgery     5.   Dyslipidemia   On Omega 3 Fish Oil     6.   CKD (chronic kidney disease) stage 3, GFR 30-59  ml/min   Monitor BUN/Cr     7.   Unspecified essential hypertension   PRN IV Hydralazine while NPO     8.   DVT Prophylaxis   SCDs          Code Status:     FULL CODE        Family Communication:  No Family Present    Disposition Plan:    Inpatient  Status        Time spent:  [place] Pager [phone]   If [place] Please Contact the Day Rounding Team  MD for [facility]  If 7PM-7AM, Please Contact Night-Floor Coverage  [email or web address] Password [email or web address] 08/14/2014, 3:59 AM     ADDENDUM:   Patient was seen and examined on 08/14/2014

## 2014-08-15 DIAGNOSIS — R140 Abdominal distension (gaseous): Secondary | ICD-10-CM

## 2014-08-15 LAB — CBC
HCT: 36.9 % (ref 36.0–46.0)
HEMOGLOBIN: 11.6 g/dL — AB (ref 12.0–15.0)
MCH: 30.4 pg (ref 26.0–34.0)
MCHC: 31.4 g/dL (ref 30.0–36.0)
MCV: 96.6 fL (ref 78.0–100.0)
Platelets: 156 10*3/uL (ref 150–400)
RBC: 3.82 MIL/uL — AB (ref 3.87–5.11)
RDW: 13.5 % (ref 11.5–15.5)
WBC: 11.3 10*3/uL — AB (ref 4.0–10.5)

## 2014-08-15 NOTE — Progress Notes (Signed)
Patient ID: [name], female   DOB: 1919-09-15, 81 y.o.   MRN: [id number]    Subjective: Pt feels ok this morning.  No nausea.  Thirsty.    Objective: Vital signs in last 24 hours: Temp:  [98.2 F (36.8 C)-98.8 F (37.1 C)] 98.2 F (36.8 C) (05/02 0549) Pulse Rate:  [89-108] 102 (05/02 0549) Resp:  [15-18] 18 (05/02 0549) BP: (134-152)/(61-92) 144/77 mmHg (05/02 0549) SpO2:  [92 %-98 %] 96 % (05/02 0549) Last BM Date: 08/13/14  Intake/Output from previous day: 05/01 0701 - 05/02 0700 In: 1215.8 [I.V.:1215.8] Out: 1000 [Urine:1000] Intake/Output this shift:    PE: Abd: soft, large right sided parastomal hernia.  This is tender to palpation, but soft.  Not fully reducible.  Ostomy with stool and air present in bag.  Lab Results:   Recent Labs  08/13/14 2141 08/14/14 0600  WBC 9.2 14.5*  HGB 13.9 12.6  HCT 43.0 39.1  PLT 193 171   BMET  Recent Labs  08/13/14 2141 08/14/14 0600  NA 143 142  K 3.4* 4.0  CL 105 106  CO2 27 25  GLUCOSE 135* 141*  BUN 14 14  CREATININE 1.66* 1.56*  CALCIUM 9.7 8.9   PT/INR No results for input(s): LABPROT, INR in the last 72 hours. CMP     Component Value Date/Time   NA 142 08/14/2014 0600   K 4.0 08/14/2014 0600   CL 106 08/14/2014 0600   CO2 25 08/14/2014 0600   GLUCOSE 141* 08/14/2014 0600   BUN 14 08/14/2014 0600   CREATININE 1.56* 08/14/2014 0600   CALCIUM 8.9 08/14/2014 0600   PROT 6.9 08/13/2014 2141   ALBUMIN 3.8 08/13/2014 2141   AST 21 08/13/2014 2141   ALT 14 08/13/2014 2141   ALKPHOS 64 08/13/2014 2141   BILITOT 0.6 08/13/2014 2141   GFRNONAA 28* 08/14/2014 0600   GFRAA 32* 08/14/2014 0600   Lipase     Component Value Date/Time   LIPASE 48 08/13/2014 2141       Studies/Results: Ct Abdomen Pelvis W Contrast  08/14/2014   CLINICAL DATA:  Abdominal distension. Nausea and vomiting. Colostomy in place. No output today. Concern for small bowel obstruction. Colon cancer  EXAM: CT ABDOMEN AND  PELVIS WITH CONTRAST  TECHNIQUE: Multidetector CT imaging of the abdomen and pelvis was performed using the standard protocol following bolus administration of intravenous contrast.  CONTRAST:  59mL OMNIPAQUE IOHEXOL 300 MG/ML  SOLN  COMPARISON:  07/15/2010  FINDINGS: Lower chest: Bibasilar volume loss. Moderate cardiomegaly. No pericardial or pleural effusion.  Hepatobiliary: Old granulomatous disease in the liver. Left hepatic lobe cyst. Cholelithiasis without acute cholecystitis or biliary ductal dilatation.  Pancreas: Normal, without mass or ductal dilatation.  Spleen: Normal  Adrenals/Urinary Tract: Normal adrenal glands. Too small to characterize lesions in the left kidney. Persistent, slightly increased right renal atrophy. Interpolar right renal cyst. Moderate right hydroureter continues to the level of an abrupt transition in the right hemipelvis which is similar and likely due to a stricture. Normal urinary bladder.  Stomach/Bowel: Normal stomach, without wall thickening.  Surgical sutures in the rectosigmoid junction. Small bowel is normal in caliber. A right-sided colostomy is identified. Parastomal hernia again identified containing right-sided colon. There is mild dilatation of the herniated colon within the parastomal hernia. No wall thickening or mesenteric edema seen within the herniated loops. Gas bubbles are seen along the dependent wall of the cecum, just proximal to the parastomal hernia. Example image 58 of series 201.  There adjacent loculated gas which are favored to be within small bowel diverticula on image 63. Minimal fluid is seen within the small bowel mesenteric on image 62.  Vascular/Lymphatic: Aortic and branch vessel atherosclerosis. No abdominopelvic adenopathy.  Reproductive: Normal uterus and adnexa.  Other: No significant free fluid.  Musculoskeletal: Convex right lumbar spine curvature.  IMPRESSION: 1. Right-sided colostomy with parastomal hernia containing colon. The colon  within the parastomal hernia is newly prominent. Cannot exclude partial colonic obstruction. No small bowel dilatation more proximally to suggest propagation of obstruction. 2. Gas within the dependent cecum, just proximal to the parastomal hernia. Favored to be due to fluid mixed with gas within the lumen. Small volume pneumatosis cannot be excluded. Correlate with lactate levels. 3. Chronic right-sided ureteric stricture with secondary right renal atrophy. 4. Cholelithiasis.   Electronically Signed   By: Abigail Miyamoto M.D.   On: 08/14/2014 00:38    Anti-infectives: Anti-infectives    None       Assessment/Plan  1. Partial colonic obstruction secondary to parastomal hernia -patient has air and stool in bag.  She has no nausea.  Will allow clear liquids today. -unclear whether she will need an operation for this.  Will d/w Dr. Ninfa Linden -WBC 14 yesterday.  Recheck cbc today. -cont to follow   LOS: 1 day    Khalif Stender E 08/15/2014, 7:56 AM Pager: [phone]

## 2014-08-15 NOTE — Progress Notes (Signed)
UR COMPLETED  

## 2014-08-15 NOTE — Progress Notes (Signed)
[facility] PROGRESS NOTE  LINDZY RUPERT GHW:[id number] DOB: 1919/12/13 DOA: 08/13/2014 PCP: Nyoka Cowden, MD  Assessment/Plan: 1. Paracolostomy hernia with SBO: - she is able to pass flatulence and has stool today. She denies nausea and vomiting and her abdominal pain has improved.  - she was started on clear liquid diet by surgery. Further recommendations as per surgery.    2. Stage 3 CKD: Appears to be at baseline.    3. MILD anemia:  Normocytic, monitor.   DVT prophylaxis.     Code Status: full code.  Family Communication: daughter at bedside Disposition Plan: pending.    Consultants: Surgery.  Procedures: none  Antibiotics:  none  HPI/Subjective: Family at bedside, her pain is better controlled.  No nausea, vomiting.   Objective: Filed Vitals:   08/15/14 0945  BP: 138/71  Pulse: 101  Temp: 98.2 F (36.8 C)  Resp: 17    Intake/Output Summary (Last 24 hours) at 08/15/14 1401 Last data filed at 08/15/14 0550  Gross per 24 hour  Intake 1215.83 ml  Output    750 ml  Net 465.83 ml   Filed Weights   08/13/14 2125 08/14/14 0349  Weight: 77.111 kg (170 lb) 79.6 kg (175 lb 7.8 oz)    Exam:   General:  Alert afebrile comfortable  Cardiovascular: s1s2  Respiratory: ctab  Abdomen: soft mildly tender in the RUQ,   Musculoskeletal: no pedal edema.   Data Reviewed: Basic Metabolic Panel:  Recent Labs Lab 08/13/14 2141 08/14/14 0600  NA 143 142  K 3.4* 4.0  CL 105 106  CO2 27 25  GLUCOSE 135* 141*  BUN 14 14  CREATININE 1.66* 1.56*  CALCIUM 9.7 8.9   Liver Function Tests:  Recent Labs Lab 08/13/14 2141  AST 21  ALT 14  ALKPHOS 64  BILITOT 0.6  PROT 6.9  ALBUMIN 3.8    Recent Labs Lab 08/13/14 2141  LIPASE 48   No results for input(s): AMMONIA in the last 168 hours. CBC:  Recent Labs Lab 08/13/14 2141 08/14/14 0600 08/15/14 1052  WBC 9.2 14.5* 11.3*  NEUTROABS 6.3  --   --   HGB 13.9 12.6  11.6*  HCT 43.0 39.1 36.9  MCV 94.5 94.2 96.6  PLT 193 171 156   Cardiac Enzymes: No results for input(s): CKTOTAL, CKMB, CKMBINDEX, TROPONINI in the last 168 hours. BNP (last 3 results) No results for input(s): BNP in the last 8760 hours.  ProBNP (last 3 results)  Recent Labs  08/05/14 1128  PROBNP 60.0    CBG: No results for input(s): GLUCAP in the last 168 hours.  No results found for this or any previous visit (from the past 240 hour(s)).   Studies: Ct Abdomen Pelvis W Contrast  08/14/2014   CLINICAL DATA:  Abdominal distension. Nausea and vomiting. Colostomy in place. No output today. Concern for small bowel obstruction. Colon cancer  EXAM: CT ABDOMEN AND PELVIS WITH CONTRAST  TECHNIQUE: Multidetector CT imaging of the abdomen and pelvis was performed using the standard protocol following bolus administration of intravenous contrast.  CONTRAST:  7mL OMNIPAQUE IOHEXOL 300 MG/ML  SOLN  COMPARISON:  07/15/2010  FINDINGS: Lower chest: Bibasilar volume loss. Moderate cardiomegaly. No pericardial or pleural effusion.  Hepatobiliary: Old granulomatous disease in the liver. Left hepatic lobe cyst. Cholelithiasis without acute cholecystitis or biliary ductal dilatation.  Pancreas: Normal, without mass or ductal dilatation.  Spleen: Normal  Adrenals/Urinary Tract: Normal adrenal glands. Too small to characterize lesions in the left kidney.  Persistent, slightly increased right renal atrophy. Interpolar right renal cyst. Moderate right hydroureter continues to the level of an abrupt transition in the right hemipelvis which is similar and likely due to a stricture. Normal urinary bladder.  Stomach/Bowel: Normal stomach, without wall thickening.  Surgical sutures in the rectosigmoid junction. Small bowel is normal in caliber. A right-sided colostomy is identified. Parastomal hernia again identified containing right-sided colon. There is mild dilatation of the herniated colon within the parastomal  hernia. No wall thickening or mesenteric edema seen within the herniated loops. Gas bubbles are seen along the dependent wall of the cecum, just proximal to the parastomal hernia. Example image 58 of series 201. There adjacent loculated gas which are favored to be within small bowel diverticula on image 63. Minimal fluid is seen within the small bowel mesenteric on image 62.  Vascular/Lymphatic: Aortic and branch vessel atherosclerosis. No abdominopelvic adenopathy.  Reproductive: Normal uterus and adnexa.  Other: No significant free fluid.  Musculoskeletal: Convex right lumbar spine curvature.  IMPRESSION: 1. Right-sided colostomy with parastomal hernia containing colon. The colon within the parastomal hernia is newly prominent. Cannot exclude partial colonic obstruction. No small bowel dilatation more proximally to suggest propagation of obstruction. 2. Gas within the dependent cecum, just proximal to the parastomal hernia. Favored to be due to fluid mixed with gas within the lumen. Small volume pneumatosis cannot be excluded. Correlate with lactate levels. 3. Chronic right-sided ureteric stricture with secondary right renal atrophy. 4. Cholelithiasis.   Electronically Signed   By: Abigail Miyamoto M.D.   On: 08/14/2014 00:38    Scheduled Meds: . antiseptic oral rinse  7 mL Mouth Rinse BID   Continuous Infusions: . sodium chloride 150 mL/hr at 08/13/14 2147  . sodium chloride 50 mL/hr at 08/14/14 1218    Principal Problem:   SBO (small bowel obstruction) Active Problems:   Dyslipidemia   CKD (chronic kidney disease) stage 3, GFR 30-59 ml/min   Nausea with vomiting   Abdominal distention   Ventral hernia with bowel obstruction   Unspecified essential hypertension   AP (abdominal pain)    Time spent: 35 minutes     [facility] Pager [phone]. If 7PM-7AM, please contact night-coverage at [email or web address], password [email or web address] 08/15/2014, 2:01 PM  LOS: 1 day

## 2014-08-16 LAB — BASIC METABOLIC PANEL
ANION GAP: 11 (ref 5–15)
BUN: 14 mg/dL (ref 6–20)
CHLORIDE: 107 mmol/L (ref 101–111)
CO2: 24 mmol/L (ref 22–32)
CREATININE: 1.29 mg/dL — AB (ref 0.44–1.00)
Calcium: 8.6 mg/dL — ABNORMAL LOW (ref 8.9–10.3)
GFR calc non Af Amer: 35 mL/min — ABNORMAL LOW (ref >60)
GFR, EST AFRICAN AMERICAN: 40 mL/min — AB (ref >60)
Glucose, Bld: 109 mg/dL — ABNORMAL HIGH (ref 70–99)
POTASSIUM: 3.3 mmol/L — AB (ref 3.5–5.1)
Sodium: 142 mmol/L (ref 135–145)

## 2014-08-16 LAB — CBC
HCT: 36.8 % (ref 36.0–46.0)
Hemoglobin: 11.6 g/dL — ABNORMAL LOW (ref 12.0–15.0)
MCH: 30.3 pg (ref 26.0–34.0)
MCHC: 31.5 g/dL (ref 30.0–36.0)
MCV: 96.1 fL (ref 78.0–100.0)
PLATELETS: 146 10*3/uL — AB (ref 150–400)
RBC: 3.83 MIL/uL — AB (ref 3.87–5.11)
RDW: 13.3 % (ref 11.5–15.5)
WBC: 10.4 10*3/uL (ref 4.0–10.5)

## 2014-08-16 MED ORDER — POTASSIUM CHLORIDE CRYS ER 20 MEQ PO TBCR
40.0000 meq | EXTENDED_RELEASE_TABLET | Freq: Two times a day (BID) | ORAL | Status: AC
  Administered 2014-08-16 – 2014-08-17 (×2): 40 meq via ORAL
  Filled 2014-08-16 (×2): qty 2

## 2014-08-16 NOTE — Care Management Note (Signed)
Case Management Note  Patient Details  Name: [name] MRN: [id number] Date of Birth: August 06, 1919  Subjective/Objective:                    Action/Plan:  Paracolostomy hernia with [id number] Discharge Date:  08/19/14               Expected Discharge Plan:     In-House Referral:     Discharge planning Services     Post Acute Care Choice:    Choice offered to:     DME Arranged:    DME Agency:     HH Arranged:    [place] Agency:     Status of Service:     Medicare Important Message Given:    Date Medicare IM Given:  08/16/14 Medicare IM give by:  Magdalen Spatz RN BSN  Date Additional Medicare IM Given:    Additional Medicare Important Message give by:     If discussed at [place] of Stay Meetings, dates discussed:    Additional Comments:  Marilu Favre, RN 08/16/2014, 8:20 AM

## 2014-08-16 NOTE — Progress Notes (Signed)
[facility] PROGRESS NOTE  [name] NWG:[id number] DOB: 05/03/1919 DOA: 08/13/2014 PCP: Nyoka Cowden, MD Interim summary: 81 y.o year old lady with h/o colon cancer s/p resection with colostomy, hypertension, admitted for nausea, vomiting and abdominal pain.  Assessment/Plan: 1. Paracolostomy hernia with SBO: - she is able to pass flatulence and has stool today in the colostomy bag. She denies nausea and vomiting and her abdominal pain has improved.  - she was started on clear liquid diet by surgery, will advance diet to full liquids today. . Further recommendations as per surgery.    2. Stage 3 CKD: Improved with fluids.    3. MILD anemia:  Normocytic, monitor.   Hypokalemia; replete as needed.   DVT prophylaxis.     Code Status: full code.  Family Communication: daughter at bedside Disposition Plan: pending.    Consultants: Surgery.  Procedures: none  Antibiotics:  none  HPI/Subjective: Family at bedside, her pain is better controlled.  No nausea, vomiting.   Objective: Filed Vitals:   08/16/14 1452  BP: 153/66  Pulse: 84  Temp: 98.4 F (36.9 C)  Resp: 16    Intake/Output Summary (Last 24 hours) at 08/16/14 1616 Last data filed at 08/16/14 1426  Gross per 24 hour  Intake   1707 ml  Output   1050 ml  Net    657 ml   Filed Weights   08/13/14 2125 08/14/14 0349  Weight: 77.111 kg (170 lb) 79.6 kg (175 lb 7.8 oz)    Exam:   General:  Alert afebrile comfortable  Cardiovascular: s1s2  Respiratory: ctab  Abdomen: soft mildly tender in the RUQ,   Musculoskeletal: no pedal edema.   Data Reviewed: Basic Metabolic Panel:  Recent Labs Lab 08/13/14 2141 08/14/14 0600 08/16/14 0505  NA 143 142 142  K 3.4* 4.0 3.3*  CL 105 106 107  CO2 27 25 24   GLUCOSE 135* 141* 109*  BUN 14 14 14   CREATININE 1.66* 1.56* 1.29*  CALCIUM 9.7 8.9 8.6*   Liver Function Tests:  Recent Labs Lab 08/13/14 2141  AST 21  ALT 14   ALKPHOS 64  BILITOT 0.6  PROT 6.9  ALBUMIN 3.8    Recent Labs Lab 08/13/14 2141  LIPASE 48   No results for input(s): AMMONIA in the last 168 hours. CBC:  Recent Labs Lab 08/13/14 2141 08/14/14 0600 08/15/14 1052 08/16/14 0505  WBC 9.2 14.5* 11.3* 10.4  NEUTROABS 6.3  --   --   --   HGB 13.9 12.6 11.6* 11.6*  HCT 43.0 39.1 36.9 36.8  MCV 94.5 94.2 96.6 96.1  PLT 193 171 156 146*   Cardiac Enzymes: No results for input(s): CKTOTAL, CKMB, CKMBINDEX, TROPONINI in the last 168 hours. BNP (last 3 results) No results for input(s): BNP in the last 8760 hours.  ProBNP (last 3 results)  Recent Labs  08/05/14 1128  PROBNP 60.0    CBG: No results for input(s): GLUCAP in the last 168 hours.  No results found for this or any previous visit (from the past 240 hour(s)).   Studies: No results found.  Scheduled Meds: . antiseptic oral rinse  7 mL Mouth Rinse BID   Continuous Infusions: . sodium chloride 75 mL/hr at 08/16/14 5784    Principal Problem:   SBO (small bowel obstruction) Active Problems:   Dyslipidemia   CKD (chronic kidney disease) stage 3, GFR 30-59 ml/min   Nausea with vomiting   Abdominal distention   Ventral hernia with bowel obstruction  Unspecified essential hypertension   AP (abdominal pain)    Time spent: 35 minutes     [facility] Pager [phone]. If 7PM-7AM, please contact night-coverage at [email or web address], password [facility] 08/16/2014, 4:16 PM  LOS: 2 days

## 2014-08-16 NOTE — Progress Notes (Signed)
Patient ID: [name], female   DOB: 08-19-1919, 81 y.o.   MRN: [id number]    Subjective: Patient feels ok today.  Tolerating clear liquids with no issues.  Still producing output in ostomy  Objective: Vital signs in last 24 hours: Temp:  [97.9 F (36.6 C)-98.5 F (36.9 C)] 98.5 F (36.9 C) (05/03 0455) Pulse Rate:  [80-101] 82 (05/03 0455) Resp:  [16-17] 16 (05/03 0455) BP: (138-155)/(61-86) 153/61 mmHg (05/03 0455) SpO2:  [92 %-98 %] 94 % (05/03 0455) Last BM Date: 08/15/14  Intake/Output from previous day: 05/02 0701 - 05/03 0700 In: 1587 [I.V.:1587] Out: 250 [Stool:250] Intake/Output this shift:    PE: Abd: soft, still mildly tender, +BS, ND, parastomal hernia present and tender, ostomy with output  Lab Results:   Recent Labs  08/15/14 1052 08/16/14 0505  WBC 11.3* 10.4  HGB 11.6* 11.6*  HCT 36.9 36.8  PLT 156 146*   BMET  Recent Labs  08/14/14 0600 08/16/14 0505  NA 142 142  K 4.0 3.3*  CL 106 107  CO2 25 24  GLUCOSE 141* 109*  BUN 14 14  CREATININE 1.56* 1.29*  CALCIUM 8.9 8.6*   PT/INR No results for input(s): LABPROT, INR in the last 72 hours. CMP     Component Value Date/Time   NA 142 08/16/2014 0505   K 3.3* 08/16/2014 0505   CL 107 08/16/2014 0505   CO2 24 08/16/2014 0505   GLUCOSE 109* 08/16/2014 0505   BUN 14 08/16/2014 0505   CREATININE 1.29* 08/16/2014 0505   CALCIUM 8.6* 08/16/2014 0505   PROT 6.9 08/13/2014 2141   ALBUMIN 3.8 08/13/2014 2141   AST 21 08/13/2014 2141   ALT 14 08/13/2014 2141   ALKPHOS 64 08/13/2014 2141   BILITOT 0.6 08/13/2014 2141   GFRNONAA 35* 08/16/2014 0505   GFRAA 40* 08/16/2014 0505   Lipase     Component Value Date/Time   LIPASE 48 08/13/2014 2141       Studies/Results: No results found.  Anti-infectives: Anti-infectives    None       Assessment/Plan   1. Partial colonic obstruction secondary to parastomal hernia  -patient has air and stool in bag. She has no nausea.  Will advance to full liquids.  -WBC normalized.  Try to avoid an operating for this if possible. -cont to follow   2.  Chronic right sided ureteric stricture with right renal atrophy - seen in the past by Drs. Jeffie Pollock and Borden 3.  Cholelithiasis   LOS: 2 days    OSBORNE,KELLY E 08/16/2014, 7:53 AM Pager: [phone]  Agree with above. Daughter, Fraser Din, in room.  The patient has been living with her daughter since Dec. Still tender around parastomal area. Of note, I did her abdominal exploration in 03/2001 for a hemoperitoneum and abdominal abscess after a gynecologic exploration.  I gave her a transverse colon colostomy.  The daughter recognized me.  Alphonsa Overall, MD, [facility] Pager: [phone] Office phone:  [phone]

## 2014-08-17 ENCOUNTER — Encounter (HOSPITAL_COMMUNITY): Payer: Self-pay

## 2014-08-17 LAB — BASIC METABOLIC PANEL
Anion gap: 10 (ref 5–15)
BUN: 10 mg/dL (ref 6–20)
CHLORIDE: 108 mmol/L (ref 101–111)
CO2: 23 mmol/L (ref 22–32)
Calcium: 8.5 mg/dL — ABNORMAL LOW (ref 8.9–10.3)
Creatinine, Ser: 1.18 mg/dL — ABNORMAL HIGH (ref 0.44–1.00)
GFR calc Af Amer: 45 mL/min — ABNORMAL LOW (ref >60)
GFR calc non Af Amer: 39 mL/min — ABNORMAL LOW (ref >60)
GLUCOSE: 93 mg/dL (ref 70–99)
POTASSIUM: 3.3 mmol/L — AB (ref 3.5–5.1)
SODIUM: 141 mmol/L (ref 135–145)

## 2014-08-17 MED ORDER — POTASSIUM CHLORIDE CRYS ER 20 MEQ PO TBCR
40.0000 meq | EXTENDED_RELEASE_TABLET | Freq: Once | ORAL | Status: AC
  Administered 2014-08-17: 40 meq via ORAL
  Filled 2014-08-17: qty 2

## 2014-08-17 NOTE — Progress Notes (Signed)
[facility]                                                                              Patient Demographics  [name], is a 81 y.o. female, DOB - [birth year], CNO:[id number]  Admit date - 08/13/2014   Admitting Physician Theressa Millard, MD  Outpatient Primary MD for the patient is Nyoka Cowden, MD  LOS - 3   Chief Complaint  Patient presents with  . Bloated  . Emesis       Brief HPI   Patient is a 81 year old female with history of colon cancer, status post resection with colostomy, hypertension and hyperlipidemia,CKD Stage IV presented to ED with complaints of increased nausea and vomiting and ABD Pain and Distention since 6 pm. She was evaluated in the ED and found to have an SBO and prominence of bowel protruding inside the ventral hernia on CT scan.Gen. surgery was consulted and patient was admitted for further workup    Assessment & Plan    Principal Problem:   Abdominal pain with paracolostomy hernia with SBO (small bowel obstruction) - Improving, advanced diet to solids by surgery today - Somewhat still tenderness at the hernia  Active Problems: Stage III CKD - Improved with IV fluids  Hypokalemia -Replaced  Code Status: Full code   Family Communication: Discussed in detail with the patient, all imaging results, lab results explained to the patient  Disposition Plan: Hopefully DC home tomorrow if okay with surgery  Time Spent in minutes   25 minutes  Procedures  None  Consults   Gen surgery  DVT Prophylaxis  SCD's  Medications  Scheduled Meds: . antiseptic oral rinse  7 mL Mouth Rinse BID   Continuous Infusions: . sodium chloride 75 mL/hr at 08/16/14 2241   PRN Meds:.acetaminophen **OR** acetaminophen, alum & mag hydroxide-simeth, HYDROmorphone (DILAUDID) injection, ondansetron **OR** ondansetron (ZOFRAN) IV   Antibiotics   Anti-infectives    None        Subjective:   [name]  was seen and examined today. Feeling better, denies any abdominal pain although sore at the hernia site.  Patient denies dizziness, chest pain, shortness of breath, N/V/D/C, new weakness, numbess, tingling. No acute events overnight.    Objective:   Blood pressure 152/68, pulse 78, temperature 99.3 F (37.4 C), temperature source Oral, resp. rate 16, height 5\' 6"  (1.676 m), weight 79.6 kg (175 lb 7.8 oz), SpO2 96 %.  Wt Readings from Last 3 Encounters:  08/14/14 79.6 kg (175 lb 7.8 oz)  08/05/14 78.926 kg (174 lb)  03/15/14 76.658 kg (169 lb)     Intake/Output Summary (Last 24 hours) at 08/17/14 1455 Last data filed at 08/17/14 1230  Gross per 24 hour  Intake 2198.75 ml  Output   1045 ml  Net 1153.75 ml    Exam  General: Alert and oriented x 3, NAD  HEENT:  PERRLA, EOMI, Anicteic Sclera, mucous membranes moist.   Neck: Supple, no JVD, no masses  CVS: S1 S2 auscultated, no rubs, murmurs or gallops. Regular rate and rhythm.  Respiratory: Clear to auscultation bilaterally, no wheezing, rales or rhonchi  Abdomen: Soft,  mildly tender around the parastomal hernia, soft, colostomy +  Ext: no cyanosis clubbing or edema  Neuro: AAOx3, Cr N's II- XII. Strength 5/5 upper and lower extremities bilaterally  Skin: No rashes  Psych: Normal affect and demeanor, alert and oriented x3    Data Review   Micro Results No results found for this or any previous visit (from the past 240 hour(s)).  Radiology Reports Ct Abdomen Pelvis W Contrast  08/14/2014   CLINICAL DATA:  Abdominal distension. Nausea and vomiting. Colostomy in place. No output today. Concern for small bowel obstruction. Colon cancer  EXAM: CT ABDOMEN AND PELVIS WITH CONTRAST  TECHNIQUE: Multidetector CT imaging of the abdomen and pelvis was performed using the standard protocol following bolus administration of intravenous contrast.  CONTRAST:  25mL OMNIPAQUE IOHEXOL 300 MG/ML  SOLN  COMPARISON:  07/15/2010  FINDINGS:  Lower chest: Bibasilar volume loss. Moderate cardiomegaly. No pericardial or pleural effusion.  Hepatobiliary: Old granulomatous disease in the liver. Left hepatic lobe cyst. Cholelithiasis without acute cholecystitis or biliary ductal dilatation.  Pancreas: Normal, without mass or ductal dilatation.  Spleen: Normal  Adrenals/Urinary Tract: Normal adrenal glands. Too small to characterize lesions in the left kidney. Persistent, slightly increased right renal atrophy. Interpolar right renal cyst. Moderate right hydroureter continues to the level of an abrupt transition in the right hemipelvis which is similar and likely due to a stricture. Normal urinary bladder.  Stomach/Bowel: Normal stomach, without wall thickening.  Surgical sutures in the rectosigmoid junction. Small bowel is normal in caliber. A right-sided colostomy is identified. Parastomal hernia again identified containing right-sided colon. There is mild dilatation of the herniated colon within the parastomal hernia. No wall thickening or mesenteric edema seen within the herniated loops. Gas bubbles are seen along the dependent wall of the cecum, just proximal to the parastomal hernia. Example image 58 of series 201. There adjacent loculated gas which are favored to be within small bowel diverticula on image 63. Minimal fluid is seen within the small bowel mesenteric on image 62.  Vascular/Lymphatic: Aortic and branch vessel atherosclerosis. No abdominopelvic adenopathy.  Reproductive: Normal uterus and adnexa.  Other: No significant free fluid.  Musculoskeletal: Convex right lumbar spine curvature.  IMPRESSION: 1. Right-sided colostomy with parastomal hernia containing colon. The colon within the parastomal hernia is newly prominent. Cannot exclude partial colonic obstruction. No small bowel dilatation more proximally to suggest propagation of obstruction. 2. Gas within the dependent cecum, just proximal to the parastomal hernia. Favored to be due to  fluid mixed with gas within the lumen. Small volume pneumatosis cannot be excluded. Correlate with lactate levels. 3. Chronic right-sided ureteric stricture with secondary right renal atrophy. 4. Cholelithiasis.   Electronically Signed   By: Abigail Miyamoto M.D.   On: 08/14/2014 00:38    CBC  Recent Labs Lab 08/13/14 2141 08/14/14 0600 08/15/14 1052 08/16/14 0505  WBC 9.2 14.5* 11.3* 10.4  HGB 13.9 12.6 11.6* 11.6*  HCT 43.0 39.1 36.9 36.8  PLT 193 171 156 146*  MCV 94.5 94.2 96.6 96.1  MCH 30.5 30.4 30.4 30.3  MCHC 32.3 32.2 31.4 31.5  RDW 13.1 13.1 13.5 13.3  LYMPHSABS 2.2  --   --   --   MONOABS 0.5  --   --   --   EOSABS 0.1  --   --   --   BASOSABS 0.1  --   --   --     Chemistries   Recent Labs Lab 08/13/14 2141 08/14/14 0600  08/16/14 0505 08/17/14 0542  NA 143 142 142 141  K 3.4* 4.0 3.3* 3.3*  CL 105 106 107 108  CO2 27 25 24 23   GLUCOSE 135* 141* 109* 93  BUN 14 14 14 10   CREATININE 1.66* 1.56* 1.29* 1.18*  CALCIUM 9.7 8.9 8.6* 8.5*  AST 21  --   --   --   ALT 14  --   --   --   ALKPHOS 64  --   --   --   BILITOT 0.6  --   --   --    ------------------------------------------------------------------------------------------------------------------ estimated creatinine clearance is 32.4 mL/min (by C-G formula based on Cr of 1.18). ------------------------------------------------------------------------------------------------------------------ No results for input(s): HGBA1C in the last 72 hours. ------------------------------------------------------------------------------------------------------------------ No results for input(s): CHOL, HDL, LDLCALC, TRIG, CHOLHDL, LDLDIRECT in the last 72 hours. ------------------------------------------------------------------------------------------------------------------ No results for input(s): TSH, T4TOTAL, T3FREE, THYROIDAB in the last 72 hours.  Invalid input(s):  FREET3 ------------------------------------------------------------------------------------------------------------------ No results for input(s): VITAMINB12, FOLATE, FERRITIN, TIBC, IRON, RETICCTPCT in the last 72 hours.  Coagulation profile No results for input(s): INR, PROTIME in the last 168 hours.  No results for input(s): DDIMER in the last 72 hours.  Cardiac Enzymes No results for input(s): CKMB, TROPONINI, MYOGLOBIN in the last 168 hours.  Invalid input(s): CK ------------------------------------------------------------------------------------------------------------------ Invalid input(s): POCBNP  No results for input(s): GLUCAP in the last 72 hours.   Ramell Wacha M.D. [facility] 08/17/2014, 2:55 PM  Pager: [phone]   Between 7am to 7pm - call Pager - [phone]  After 7pm go to [email or web address] - password [email or web address]  Call night coverage person covering after 7pm

## 2014-08-17 NOTE — Progress Notes (Signed)
Patient ID: [name], female   DOB: [birth year], 81 y.o.   MRN: [id number]    Subjective: Pt feels ok today.  Tolerated full liquids with no issues.  Still sore around her hernia.  Moving her bowels  Objective: Vital signs in last 24 hours: Temp:  [96.5 F (35.8 C)-99.7 F (37.6 C)] 99.3 F (37.4 C) (05/04 0510) Pulse Rate:  [78-85] 78 (05/04 0510) Resp:  [16] 16 (05/04 0510) BP: (152-172)/(66-73) 152/68 mmHg (05/04 0510) SpO2:  [94 %-98 %] 96 % (05/04 0510) Last BM Date: 08/16/14  Intake/Output from previous day: 05/03 0701 - 05/04 0700 In: 2078.8 [P.O.:240; I.V.:1838.8] Out: 924 [Urine:350; Stool:425] Intake/Output this shift:    PE: Abd: soft, still mildly tender around her parastomal hernia, soft, but not reducible.  Has output in her colostomy bag  Lab Results:   Recent Labs  08/15/14 1052 08/16/14 0505  WBC 11.3* 10.4  HGB 11.6* 11.6*  HCT 36.9 36.8  PLT 156 146*   BMET  Recent Labs  08/16/14 0505  NA 142  K 3.3*  CL 107  CO2 24  GLUCOSE 109*  BUN 14  CREATININE 1.29*  CALCIUM 8.6*   PT/INR No results for input(s): LABPROT, INR in the last 72 hours. CMP     Component Value Date/Time   NA 142 08/16/2014 0505   K 3.3* 08/16/2014 0505   CL 107 08/16/2014 0505   CO2 24 08/16/2014 0505   GLUCOSE 109* 08/16/2014 0505   BUN 14 08/16/2014 0505   CREATININE 1.29* 08/16/2014 0505   CALCIUM 8.6* 08/16/2014 0505   PROT 6.9 08/13/2014 2141   ALBUMIN 3.8 08/13/2014 2141   AST 21 08/13/2014 2141   ALT 14 08/13/2014 2141   ALKPHOS 64 08/13/2014 2141   BILITOT 0.6 08/13/2014 2141   GFRNONAA 35* 08/16/2014 0505   GFRAA 40* 08/16/2014 0505   Lipase     Component Value Date/Time   LIPASE 48 08/13/2014 2141       Studies/Results: No results found.  Anti-infectives: Anti-infectives    None       Assessment/Plan   1. Partial colonic obstruction secondary to parastomal hernia -advance to regular diet, if she tolerates this, she is  surgically stable for dc home.  She may follow up with Dr. Lucia Gaskins in our office if she has any further issues with her hernia. -suspect tenderness will continued to improve.  WBC normal   LOS: 3 days    [name] E 08/17/2014, 7:47 AM Pager: [phone]

## 2014-08-18 DIAGNOSIS — R1084 Generalized abdominal pain: Secondary | ICD-10-CM

## 2014-08-18 LAB — BASIC METABOLIC PANEL
Anion gap: 6 (ref 5–15)
BUN: 9 mg/dL (ref 6–20)
CO2: 23 mmol/L (ref 22–32)
Calcium: 8.6 mg/dL — ABNORMAL LOW (ref 8.9–10.3)
Chloride: 113 mmol/L — ABNORMAL HIGH (ref 101–111)
Creatinine, Ser: 0.75 mg/dL (ref 0.44–1.00)
GFR calc Af Amer: >60 mL/min (ref >60)
GFR calc non Af Amer: >60 mL/min (ref >60)
Glucose, Bld: 108 mg/dL — ABNORMAL HIGH (ref 70–99)
Potassium: 3.3 mmol/L — ABNORMAL LOW (ref 3.5–5.1)
Sodium: 142 mmol/L (ref 135–145)

## 2014-08-18 MED ORDER — FAMOTIDINE 20 MG PO TABS
20.0000 mg | ORAL_TABLET | Freq: Two times a day (BID) | ORAL | Status: DC
  Administered 2014-08-18: 20 mg via ORAL
  Filled 2014-08-18: qty 1

## 2014-08-18 MED ORDER — HYDROCORTISONE 1 % EX CREA
TOPICAL_CREAM | Freq: Two times a day (BID) | CUTANEOUS | Status: DC
  Administered 2014-08-18: 11:00:00 via TOPICAL
  Filled 2014-08-18: qty 28

## 2014-08-18 MED ORDER — DIPHENHYDRAMINE HCL 25 MG PO TABS: 25.0000 mg | ORAL_TABLET | Freq: Four times a day (QID) | ORAL | Status: DC | PRN

## 2014-08-18 MED ORDER — DIPHENHYDRAMINE HCL 25 MG PO CAPS
25.0000 mg | ORAL_CAPSULE | Freq: Once | ORAL | Status: AC
  Administered 2014-08-18: 25 mg via ORAL
  Filled 2014-08-18: qty 1

## 2014-08-18 MED ORDER — DIPHENHYDRAMINE HCL 50 MG/ML IJ SOLN: 12.5000 mg | Freq: Once | INTRAMUSCULAR | Status: DC

## 2014-08-18 MED ORDER — FAMOTIDINE 20 MG PO TABS: 20.0000 mg | ORAL_TABLET | Freq: Two times a day (BID) | ORAL | Status: DC

## 2014-08-18 MED ORDER — DIPHENHYDRAMINE-ZINC ACETATE 1-0.1 % EX CREA: TOPICAL_CREAM | Freq: Two times a day (BID) | CUTANEOUS | Status: DC | PRN

## 2014-08-18 MED ORDER — HYDROCORTISONE 1 % EX CREA: TOPICAL_CREAM | Freq: Two times a day (BID) | CUTANEOUS | Status: AC

## 2014-08-18 MED ORDER — POTASSIUM CHLORIDE CRYS ER 20 MEQ PO TBCR
40.0000 meq | EXTENDED_RELEASE_TABLET | Freq: Once | ORAL | Status: AC
  Administered 2014-08-18: 40 meq via ORAL
  Filled 2014-08-18: qty 2

## 2014-08-18 NOTE — Progress Notes (Signed)
Patient ID: [name], female   DOB: 1919/07/06, 81 y.o.   MRN: [id number]    Subjective: Pt feels better today.  Tolerating solid diet with no issues.  Having output in her bag  Objective: Vital signs in last 24 hours: Temp:  [98.2 F (36.8 C)-99.5 F (37.5 C)] 98.2 F (36.8 C) (05/05 0446) Pulse Rate:  [75-89] 76 (05/05 0446) Resp:  [16-18] 18 (05/05 0446) BP: (154-175)/(68-83) 158/69 mmHg (05/05 0446) SpO2:  [94 %-100 %] 95 % (05/05 0446) Last BM Date: 08/17/14  Intake/Output from previous day: 05/04 0701 - 05/05 0700 In: 1130 [P.O.:1130] Out: 2320 [Urine:320; Stool:2000] Intake/Output this shift:    PE: Abd: soft, less tender, hernia still soft and nonreducible, +BS  Lab Results:   Recent Labs  08/15/14 1052 08/16/14 0505  WBC 11.3* 10.4  HGB 11.6* 11.6*  HCT 36.9 36.8  PLT 156 146*   BMET  Recent Labs  08/17/14 0542 08/18/14 0324  NA 141 142  K 3.3* 3.3*  CL 108 113*  CO2 23 23  GLUCOSE 93 108*  BUN 10 9  CREATININE 1.18* 0.75  CALCIUM 8.5* 8.6*   PT/INR No results for input(s): LABPROT, INR in the last 72 hours. CMP     Component Value Date/Time   NA 142 08/18/2014 0324   K 3.3* 08/18/2014 0324   CL 113* 08/18/2014 0324   CO2 23 08/18/2014 0324   GLUCOSE 108* 08/18/2014 0324   BUN 9 08/18/2014 0324   CREATININE 0.75 08/18/2014 0324   CALCIUM 8.6* 08/18/2014 0324   PROT 6.9 08/13/2014 2141   ALBUMIN 3.8 08/13/2014 2141   AST 21 08/13/2014 2141   ALT 14 08/13/2014 2141   ALKPHOS 64 08/13/2014 2141   BILITOT 0.6 08/13/2014 2141   GFRNONAA >60 08/18/2014 0324   GFRAA >60 08/18/2014 0324   Lipase     Component Value Date/Time   LIPASE 48 08/13/2014 2141       Studies/Results: No results found.  Anti-infectives: Anti-infectives    None       Assessment/Plan   1. Partial colonic obstruction secondary to parastomal hernia -tolerating a regular diet -patient surgically stable for dc home -we will sign off.  She may  follow up with Dr. Lucia Gaskins prn  LOS: 4 days    Milburn Freeney E 08/18/2014, 8:15 AM Pager: [phone]

## 2014-08-18 NOTE — Discharge Planning (Signed)
Copy of AVS reviewed with pt's family and they verbalize understanding.  D'cd to private car home with all personal belongings acomp. By family at 1110.

## 2014-08-18 NOTE — Discharge Summary (Signed)
Physician Discharge Summary   Patient ID: [name] MRN: [id number] DOB/AGE: 1919/07/18 81 y.o.  Admit date: 08/13/2014 Discharge date: 08/18/2014  Primary Care Physician:  Nyoka Cowden, MD  Discharge Diagnoses:    . SBO (small bowel obstruction)/partial colonic obstruction secondary to parastomal hernia  . Nausea with vomiting Acute renal insufficiency  . rash probably due to potassium . Dyslipidemia . Ventral hernia with bowel obstruction . CKD (chronic kidney disease) stage 3, GFR 30-59 ml/min . Unspecified essential hypertension  Consults: Gen. Surgery   Recommendations for Outpatient Follow-up:  Avoid constipation    DIET: Heart healthy diet    Allergies:   Allergies  Allergen Reactions  . Amoxicillin-Pot Clavulanate     REACTION: hives     Discharge Medications:   Medication List    TAKE these medications        amLODipine 5 MG tablet  Commonly known as:  NORVASC  TAKE ONE TABLET BY MOUTH ONCE DAILY.     COLOPLAST DRAINABLE POUCH POUCH Misc  1 each by Does not apply route as needed.     diphenhydrAMINE 25 MG tablet  Commonly known as:  BENADRYL  Take 1 tablet (25 mg total) by mouth every 6 (six) hours as needed for itching.     diphenhydrAMINE-zinc acetate cream  Commonly known as:  BENADRYL  Apply topically 2 (two) times daily as needed for itching. For the rash     famotidine 20 MG tablet  Commonly known as:  PEPCID  Take 1 tablet (20 mg total) by mouth 2 (two) times daily.     Fish Oil 1200 MG Caps  Take by mouth daily.     Flaxseed Oil 1000 MG Caps  Take 2,000 mg by mouth daily.     hydrocortisone cream 1 %  Apply topically 2 (two) times daily. Or LOTION, available over the counter. Apply to the rash until heals.     metoprolol 50 MG tablet  Commonly known as:  LOPRESSOR  TAKE ONE-HALF TABLET BY MOUTH TWICE DAILY.     ONE-A-DAY WOMENS 50 PLUS PO  Take 1 tablet by mouth daily.         Brief H and P: For  complete details please refer to admission H and P, but in brief Patient is a 81 year old female with history of colon cancer, status post resection with colostomy, hypertension and hyperlipidemia,CKD Stage IV presented to ED with complaints of increased nausea and vomiting and ABD Pain and Distention since 6 pm. She was evaluated in the ED and found to have an SBO and prominence of bowel protruding inside the ventral hernia on CT scan.Gen. surgery was consulted and patient was admitted for further workup     Hospital Course:   Abdominal pain with paracolostomy hernia with SBO (small bowel obstruction) Improved, patient was admitted with nausea, vomiting, abdominal distention, has colostomy due to colon cancer. CT of the abdomen and pelvis was obtained which showed right-sided colostomy with parastomal hernia containing colon, likely partial colonic obstruction. General surgery was consulted, patient was initially placed on nothing by mouth status however diet was slowly advanced and she is tolerating solid diet without any difficulty    Acute on chronic Stage III CKD - Creatinine was 1.56 at the time of admission. Patient was placed on IV fluid hydration, creatinine has improved to 0.75   Hypokalemia -Replaced  Rash: Likely allergic reaction Patient was noticed to have diffuse rash in the chest area, upper medial left thigh, patient had  received only potassium supplementation otherwise she was on her oral outpatient home medication. Per patient's daughter she has had similar episode with potassium in the past. Patient was given prescription for Pepcid, Benadryl and hydrocortisone cream.  Day of Discharge BP 163/73 mmHg  Pulse 87  Temp(Src) 98.1 F (36.7 C) (Oral)  Resp 16  Ht 5\' 6"  (1.676 m)  Wt 79.6 kg (175 lb 7.8 oz)  BMI 28.34 kg/m2  SpO2 100%  Physical Exam: General: Alert and awake oriented x3 not in any acute distress. HEENT: anicteric sclera, pupils reactive to light and  accommodation CVS: S1-S2 clear no murmur rubs or gallops Chest: clear to auscultation bilaterally, no wheezing rales or rhonchi Abdomen: soft nontender, colostomy+, bowel sounds+ Ext: No cyanosis clubbing or edema  noted bilaterally Neuro: Cranial nerves II-XII intact, no focal neurological deficits   The results of significant diagnostics from this hospitalization (including imaging, microbiology, ancillary and laboratory) are listed below for reference.    LAB RESULTS: Basic Metabolic Panel:  Recent Labs Lab 08/17/14 0542 08/18/14 0324  NA 141 142  K 3.3* 3.3*  CL 108 113*  CO2 23 23  GLUCOSE 93 108*  BUN 10 9  CREATININE 1.18* 0.75  CALCIUM 8.5* 8.6*   Liver Function Tests:  Recent Labs Lab 08/13/14 2141  AST 21  ALT 14  ALKPHOS 64  BILITOT 0.6  PROT 6.9  ALBUMIN 3.8    Recent Labs Lab 08/13/14 2141  LIPASE 48   No results for input(s): AMMONIA in the last 168 hours. CBC:  Recent Labs Lab 08/13/14 2141  08/15/14 1052 08/16/14 0505  WBC 9.2  < > 11.3* 10.4  NEUTROABS 6.3  --   --   --   HGB 13.9  < > 11.6* 11.6*  HCT 43.0  < > 36.9 36.8  MCV 94.5  < > 96.6 96.1  PLT 193  < > 156 146*  < > = values in this interval not displayed. Cardiac Enzymes: No results for input(s): CKTOTAL, CKMB, CKMBINDEX, TROPONINI in the last 168 hours. BNP: Invalid input(s): POCBNP CBG: No results for input(s): GLUCAP in the last 168 hours.  Significant Diagnostic Studies:  Ct Abdomen Pelvis W Contrast  08/14/2014   CLINICAL DATA:  Abdominal distension. Nausea and vomiting. Colostomy in place. No output today. Concern for small bowel obstruction. Colon cancer  EXAM: CT ABDOMEN AND PELVIS WITH CONTRAST  TECHNIQUE: Multidetector CT imaging of the abdomen and pelvis was performed using the standard protocol following bolus administration of intravenous contrast.  CONTRAST:  64mL OMNIPAQUE IOHEXOL 300 MG/ML  SOLN  COMPARISON:  07/15/2010  FINDINGS: Lower chest: Bibasilar  volume loss. Moderate cardiomegaly. No pericardial or pleural effusion.  Hepatobiliary: Old granulomatous disease in the liver. Left hepatic lobe cyst. Cholelithiasis without acute cholecystitis or biliary ductal dilatation.  Pancreas: Normal, without mass or ductal dilatation.  Spleen: Normal  Adrenals/Urinary Tract: Normal adrenal glands. Too small to characterize lesions in the left kidney. Persistent, slightly increased right renal atrophy. Interpolar right renal cyst. Moderate right hydroureter continues to the level of an abrupt transition in the right hemipelvis which is similar and likely due to a stricture. Normal urinary bladder.  Stomach/Bowel: Normal stomach, without wall thickening.  Surgical sutures in the rectosigmoid junction. Small bowel is normal in caliber. A right-sided colostomy is identified. Parastomal hernia again identified containing right-sided colon. There is mild dilatation of the herniated colon within the parastomal hernia. No wall thickening or mesenteric edema seen within the herniated loops.  Gas bubbles are seen along the dependent wall of the cecum, just proximal to the parastomal hernia. Example image 58 of series 201. There adjacent loculated gas which are favored to be within small bowel diverticula on image 63. Minimal fluid is seen within the small bowel mesenteric on image 62.  Vascular/Lymphatic: Aortic and branch vessel atherosclerosis. No abdominopelvic adenopathy.  Reproductive: Normal uterus and adnexa.  Other: No significant free fluid.  Musculoskeletal: Convex right lumbar spine curvature.  IMPRESSION: 1. Right-sided colostomy with parastomal hernia containing colon. The colon within the parastomal hernia is newly prominent. Cannot exclude partial colonic obstruction. No small bowel dilatation more proximally to suggest propagation of obstruction. 2. Gas within the dependent cecum, just proximal to the parastomal hernia. Favored to be due to fluid mixed with gas within  the lumen. Small volume pneumatosis cannot be excluded. Correlate with lactate levels. 3. Chronic right-sided ureteric stricture with secondary right renal atrophy. 4. Cholelithiasis.   Electronically Signed   By: Abigail Miyamoto M.D.   On: 08/14/2014 00:38    2D ECHO:   Disposition and Follow-up:     Discharge Instructions    Diet - low sodium heart healthy    Complete by:  As directed      Discharge instructions    Complete by:  As directed   Please apply hydrocortisone lotion or cream twice a day until the rash heals. You can give benadryl tabs every 6 hours as needed for itching or can use Benadryl cream as needed for the rash.     Increase activity slowly    Complete by:  As directed             DISPOSITION home   DISCHARGE FOLLOW-UP Follow-up Information    Follow up with [facility], [place].   Specialty:  General Surgery   Why:  As needed   Contact information:   [place] [address] [phone]       Follow up with Nyoka Cowden, MD. Schedule an appointment as soon as possible for a visit in 10 days.   Specialty:  Internal Medicine   Why:  for hospital follow-up   Contact information:   [place]  [address] [phone]        Time spent on Discharge: 35 minutes  Signed:   Stevens Magwood M.D. [facility] 08/18/2014, 2:01 PM Pager: [phone]

## 2014-08-18 NOTE — Progress Notes (Addendum)
Rashes noted all over Pt's body and groin area. Denies itchiness. [name] made aware. At 0715 reports itching with rash.

## 2014-09-15 ENCOUNTER — Ambulatory Visit: Payer: Medicare Other | Admitting: Internal Medicine

## 2014-09-16 ENCOUNTER — Ambulatory Visit (INDEPENDENT_AMBULATORY_CARE_PROVIDER_SITE_OTHER): Payer: Medicare Other | Admitting: Internal Medicine

## 2014-09-16 ENCOUNTER — Encounter: Payer: Self-pay | Admitting: Internal Medicine

## 2014-09-16 VITALS — BP 138/80 | HR 67 | Temp 98.3°F | Resp 20 | Ht 66.0 in | Wt 166.0 lb

## 2014-09-16 DIAGNOSIS — K436 Other and unspecified ventral hernia with obstruction, without gangrene: Secondary | ICD-10-CM | POA: Diagnosis not present

## 2014-09-16 DIAGNOSIS — I10 Essential (primary) hypertension: Secondary | ICD-10-CM

## 2014-09-16 DIAGNOSIS — M48061 Spinal stenosis, lumbar region without neurogenic claudication: Secondary | ICD-10-CM

## 2014-09-16 DIAGNOSIS — E785 Hyperlipidemia, unspecified: Secondary | ICD-10-CM | POA: Diagnosis not present

## 2014-09-16 DIAGNOSIS — M4806 Spinal stenosis, lumbar region: Secondary | ICD-10-CM | POA: Diagnosis not present

## 2014-09-16 NOTE — Progress Notes (Signed)
Subjective:    Patient ID: [name], female    DOB: 01/12/1920, 81 y.o.   MRN: [id number]  HPI Admit date: 08/13/2014 Discharge date: 08/18/2014  Primary Care Physician: Nyoka Cowden, MD  Discharge Diagnoses:   . SBO (small bowel obstruction)/partial colonic obstruction secondary to parastomal hernia  . Nausea with vomiting Acute renal insufficiency  . rash probably due to potassium . Dyslipidemia . Ventral hernia with bowel obstruction . CKD (chronic kidney disease) stage 3, GFR 30-59 ml/min . Unspecified essential hypertension  81 year old patient who is seen today in follow-up.  She was admitted to the hospital and discharged about one month ago.  She has done quite well.  She has minimal cough over the past week but no fever.  She has essential hypertension which has been stable. No recurrent nausea, vomiting, or ostomy issues  Hospital records reviewed  Past Medical History  Diagnosis Date  . CARCINOMA, COLON 02/03/2007  . HYPERLIPIDEMIA 06/19/2007  . Unspecified essential hypertension 02/03/2007  . HYPOTENSION, ORTHOSTATIC 12/24/2007  . PANCREATITIS, ACUTE 12/27/2007  . RENAL FAILURE, ACUTE 12/27/2007  . KIDNEY STONE 02/04/2007  . 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 Lumbago 03/13/2007  . SYNCOPE 03/13/2007  . Dysuria 02/04/2007  . NEPHROLITHIASIS, HX OF 06/19/2007  . LEG PAIN, LEFT 05/14/2010    History   Social History  . Marital Status: Married    Spouse Name: N/A  . Number of Children: N/A  . Years of Education: N/A   Occupational History  . Not on file.   Social History Main Topics  . Smoking status: Never Smoker   . Smokeless tobacco: Never Used  . Alcohol Use: No  . Drug Use: No  . Sexual Activity: Not on file   Other Topics Concern  . Not on file   Social History Narrative    Past Surgical History  Procedure Laterality Date  . Colon surgery  2002  . Cystoscopy      Family History  Problem Relation Age of Onset  .  Cirrhosis Mother 68    nonalcholic  . Leukemia Father     Allergies  Allergen Reactions  . Amoxicillin-Pot Clavulanate     REACTION: hives    Current Outpatient Prescriptions on File Prior to Visit  Medication Sig Dispense Refill  . amLODipine (NORVASC) 5 MG tablet TAKE ONE TABLET BY MOUTH ONCE DAILY. 90 tablet 3  . famotidine (PEPCID) 20 MG tablet Take 1 tablet (20 mg total) by mouth 2 (two) times daily. (Patient taking differently: Take 20 mg by mouth as needed. ) 20 tablet 0  . Flaxseed, Linseed, (FLAXSEED OIL) 1000 MG CAPS Take 2,000 mg by mouth daily.     . hydrocortisone cream 1 % Apply topically 2 (two) times daily. Or LOTION, available over the counter. Apply to the rash until heals. (Patient taking differently: Apply topically 2 (two) times daily as needed. Or LOTION, available over the counter. Apply to the rash until heals.) 30 g 0  . metoprolol (LOPRESSOR) 50 MG tablet TAKE ONE-HALF TABLET BY MOUTH TWICE DAILY. 90 tablet 3  . Multiple Vitamins-Minerals (ONE-A-DAY WOMENS 50 PLUS PO) Take 1 tablet by mouth daily.    . Omega-3 Fatty Acids (FISH OIL) 1200 MG CAPS Take by mouth daily.      . Ostomy Supplies (COLOPLAST DRAINABLE POUCH) POUCH MISC 1 each by Does not apply route as needed. 20 each 3   No current facility-administered medications on file prior to visit.    BP  138/80 mmHg  Pulse 67  Temp(Src) 98.3 F (36.8 C) (Oral)  Resp 20  Ht 5\' 6"  (1.676 m)  Wt 166 lb (75.297 kg)  BMI 26.81 kg/m2  SpO2 97%      Review of Systems  Constitutional: Negative.   HENT: Negative for congestion, dental problem, hearing loss, rhinorrhea, sinus pressure, sore throat and tinnitus.   Eyes: Negative for pain, discharge and visual disturbance.  Respiratory: Positive for cough. Negative for shortness of breath.   Cardiovascular: Negative for chest pain, palpitations and leg swelling.  Gastrointestinal: Negative for nausea, vomiting, abdominal pain, diarrhea, constipation, blood in  stool and abdominal distention.  Genitourinary: Negative for dysuria, urgency, frequency, hematuria, flank pain, vaginal bleeding, vaginal discharge, difficulty urinating, vaginal pain and pelvic pain.  Musculoskeletal: Negative for joint swelling, arthralgias and gait problem.  Skin: Negative for rash.  Neurological: Negative for dizziness, syncope, speech difficulty, weakness, numbness and headaches.  Hematological: Negative for adenopathy.  Psychiatric/Behavioral: Negative for behavioral problems, dysphoric mood and agitation. The patient is not nervous/anxious.        Objective:   Physical Exam  Constitutional: She is oriented to person, place, and time. She appears well-developed and well-nourished.  HENT:  Head: Normocephalic.  Right Ear: External ear normal.  Left Ear: External ear normal.  Mouth/Throat: Oropharynx is clear and moist.  Eyes: Conjunctivae and EOM are normal. Pupils are equal, round, and reactive to light.  Neck: Normal range of motion. Neck supple. No thyromegaly present.  Cardiovascular: Normal rate, regular rhythm, normal heart sounds and intact distal pulses.   Pulmonary/Chest: Effort normal and breath sounds normal. No respiratory distress. She has no wheezes. She has no rales.  Abdominal: Soft. Bowel sounds are normal. She exhibits no mass. There is no tenderness.  Musculoskeletal: Normal range of motion.  Lymphadenopathy:    She has no cervical adenopathy.  Neurological: She is alert and oriented to person, place, and time.  Skin: Skin is warm and dry. No rash noted.  Psychiatric: She has a normal mood and affect. Her behavior is normal.          Assessment & Plan:   Hypertension, well-controlled Dyslipidemia Status post small bowel obstruction.  Clinically stable Chronic kidney disease.  No change in therapy  Recheck 3 months Medications updated

## 2014-09-16 NOTE — Progress Notes (Signed)
Pre visit review using our clinic review tool, if applicable. No additional management support is needed unless otherwise documented below in the visit note. 

## 2014-09-16 NOTE — Patient Instructions (Signed)
Limit your sodium (Salt) intake  Return in 6 months for follow-up  

## 2014-09-20 ENCOUNTER — Other Ambulatory Visit: Payer: Self-pay | Admitting: Internal Medicine

## 2014-10-14 ENCOUNTER — Other Ambulatory Visit: Payer: Self-pay | Admitting: Internal Medicine

## 2014-11-22 ENCOUNTER — Ambulatory Visit (INDEPENDENT_AMBULATORY_CARE_PROVIDER_SITE_OTHER): Payer: Medicare Other | Admitting: Internal Medicine

## 2014-11-22 ENCOUNTER — Encounter: Payer: Self-pay | Admitting: Internal Medicine

## 2014-11-22 VITALS — BP 148/84 | HR 95 | Temp 98.3°F | Ht 66.0 in | Wt 159.0 lb

## 2014-11-22 DIAGNOSIS — N183 Chronic kidney disease, stage 3 unspecified: Secondary | ICD-10-CM

## 2014-11-22 DIAGNOSIS — M48061 Spinal stenosis, lumbar region without neurogenic claudication: Secondary | ICD-10-CM

## 2014-11-22 DIAGNOSIS — M545 Low back pain: Secondary | ICD-10-CM | POA: Diagnosis not present

## 2014-11-22 DIAGNOSIS — M4806 Spinal stenosis, lumbar region: Secondary | ICD-10-CM | POA: Diagnosis not present

## 2014-11-22 DIAGNOSIS — I10 Essential (primary) hypertension: Secondary | ICD-10-CM

## 2014-11-22 NOTE — Patient Instructions (Signed)
Limit your sodium (Salt) intake  Please check your blood pressure on a regular basis.  If it is consistently greater than 150/90, please make an office appointment.  Return in 3 months for follow-up  

## 2014-11-22 NOTE — Progress Notes (Signed)
Pre visit review using our clinic review tool, if applicable. No additional management support is needed unless otherwise documented below in the visit note. 

## 2014-11-22 NOTE — Progress Notes (Signed)
Subjective:    Patient ID: [name], female    DOB: Apr 08, 1920, 81 y.o.   MRN: [id number]  HPI  81 year old patient who has essential hypertension, osteoarthritis, lumbar spinal stenosis.  She was hospitalized in the spring for small bowel obstruction.  Today, complaints include intermittent left shoulder and right leg pain.  Past Medical History  Diagnosis Date  . CARCINOMA, COLON 02/03/2007  . HYPERLIPIDEMIA 06/19/2007  . Unspecified essential hypertension 02/03/2007  . HYPOTENSION, ORTHOSTATIC 12/24/2007  . PANCREATITIS, ACUTE 12/27/2007  . RENAL FAILURE, ACUTE 12/27/2007  . KIDNEY STONE 02/04/2007  . 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 Lumbago 03/13/2007  . SYNCOPE 03/13/2007  . Dysuria 02/04/2007  . NEPHROLITHIASIS, HX OF 06/19/2007  . LEG PAIN, LEFT 05/14/2010    History   Social History  . Marital Status: Married    Spouse Name: N/A  . Number of Children: N/A  . Years of Education: N/A   Occupational History  . Not on file.   Social History Main Topics  . Smoking status: Never Smoker   . Smokeless tobacco: Never Used  . Alcohol Use: No  . Drug Use: No  . Sexual Activity: Not on file   Other Topics Concern  . Not on file   Social History Narrative    Past Surgical History  Procedure Laterality Date  . Colon surgery  2002  . Cystoscopy      Family History  Problem Relation Age of Onset  . Cirrhosis Mother 46    nonalcholic  . Leukemia Father     Allergies  Allergen Reactions  . Amoxicillin-Pot Clavulanate     REACTION: hives    Current Outpatient Prescriptions on File Prior to Visit  Medication Sig Dispense Refill  . amLODipine (NORVASC) 5 MG tablet TAKE 1 TABLET BY MOUTH EVERY DAY 90 tablet 1  . famotidine (PEPCID) 20 MG tablet Take 1 tablet (20 mg total) by mouth 2 (two) times daily. (Patient taking differently: Take 20 mg by mouth as needed. ) 20 tablet 0  . Flaxseed, Linseed, (FLAXSEED OIL) 1000 MG CAPS Take 2,000 mg by mouth  daily.     . metoprolol (LOPRESSOR) 50 MG tablet TAKE 1/2 TABLET BY MOUTH TWICE DAILY 90 tablet 1  . Multiple Vitamins-Minerals (ONE-A-DAY WOMENS 50 PLUS PO) Take 1 tablet by mouth daily.    . Omega-3 Fatty Acids (FISH OIL) 1200 MG CAPS Take by mouth daily.      . Ostomy Supplies (COLOPLAST DRAINABLE POUCH) POUCH MISC 1 each by Does not apply route as needed. 20 each 3  . hydrocortisone cream 1 % Apply topically 2 (two) times daily. Or LOTION, available over the counter. Apply to the rash until heals. (Patient not taking: Reported on 11/22/2014) 30 g 0   No current facility-administered medications on file prior to visit.    BP 148/84 mmHg  Pulse 95  Temp(Src) 98.3 F (36.8 C)  Ht 5\' 6"  (1.676 m)  Wt 159 lb (72.122 kg)  BMI 25.68 kg/m2     Review of Systems  Constitutional: Negative.   HENT: Negative for congestion, dental problem, hearing loss, rhinorrhea, sinus pressure, sore throat and tinnitus.   Eyes: Negative for pain, discharge and visual disturbance.  Respiratory: Negative for cough and shortness of breath.   Cardiovascular: Negative for chest pain, palpitations and leg swelling.  Gastrointestinal: Negative for nausea, vomiting, abdominal pain, diarrhea, constipation, blood in stool and abdominal distention.  Genitourinary: Negative for dysuria, urgency, frequency, hematuria, flank pain,  vaginal bleeding, vaginal discharge, difficulty urinating, vaginal pain and pelvic pain.  Musculoskeletal: Positive for back pain, arthralgias and gait problem. Negative for joint swelling.  Skin: Negative for rash.  Neurological: Negative for dizziness, syncope, speech difficulty, weakness, numbness and headaches.  Hematological: Negative for adenopathy.  Psychiatric/Behavioral: Negative for behavioral problems, dysphoric mood and agitation. The patient is not nervous/anxious.        Objective:   Physical Exam  Constitutional: She is oriented to person, place, and time. She appears  well-developed and well-nourished.  Blood pressure 140/80 Able to stand from a sitting position unassisted.  Able to ambulate across the room  No complaints of shoulder or leg pain at the present time  HENT:  Head: Normocephalic.  Right Ear: External ear normal.  Left Ear: External ear normal.  Mouth/Throat: Oropharynx is clear and moist.  Eyes: Conjunctivae and EOM are normal. Pupils are equal, round, and reactive to light.  Neck: Normal range of motion. Neck supple. No thyromegaly present.  Cardiovascular: Normal rate, regular rhythm, normal heart sounds and intact distal pulses.   Pulmonary/Chest: Effort normal and breath sounds normal.  Abdominal: Soft. Bowel sounds are normal. She exhibits no mass. There is no tenderness.  Musculoskeletal: Normal range of motion.  Slight decreased range of motion left shoulder without pain  Range of motion the right hip does cause some mild discomfort  Lymphadenopathy:    She has no cervical adenopathy.  Neurological: She is alert and oriented to person, place, and time.  Skin: Skin is warm and dry. No rash noted.  Psychiatric: She has a normal mood and affect. Her behavior is normal.          Assessment & Plan:   Intermittent left shoulder and right hip pain.  Osteoarthritis.  Will continue Tylenol when necessary hypertension, stable   Recheck 3 months

## 2014-12-28 ENCOUNTER — Ambulatory Visit: Payer: Medicare Other | Admitting: *Deleted

## 2014-12-29 ENCOUNTER — Ambulatory Visit: Payer: Medicare Other | Admitting: *Deleted

## 2015-01-05 ENCOUNTER — Ambulatory Visit (INDEPENDENT_AMBULATORY_CARE_PROVIDER_SITE_OTHER): Payer: Medicare Other | Admitting: *Deleted

## 2015-01-05 DIAGNOSIS — Z23 Encounter for immunization: Secondary | ICD-10-CM | POA: Diagnosis not present

## 2015-03-17 ENCOUNTER — Encounter: Payer: Self-pay | Admitting: Internal Medicine

## 2015-03-17 ENCOUNTER — Ambulatory Visit (INDEPENDENT_AMBULATORY_CARE_PROVIDER_SITE_OTHER): Payer: Medicare Other | Admitting: Internal Medicine

## 2015-03-17 VITALS — BP 140/80 | HR 96 | Temp 98.1°F | Resp 20 | Ht 66.0 in | Wt 166.0 lb

## 2015-03-17 DIAGNOSIS — I10 Essential (primary) hypertension: Secondary | ICD-10-CM

## 2015-03-17 DIAGNOSIS — E785 Hyperlipidemia, unspecified: Secondary | ICD-10-CM

## 2015-03-17 DIAGNOSIS — N183 Chronic kidney disease, stage 3 unspecified: Secondary | ICD-10-CM

## 2015-03-17 NOTE — Progress Notes (Signed)
Subjective:    Patient ID: [name], female    DOB: 1920/02/02, 81 y.o.   MRN: [id number]  HPI 81 year old patient who has essential hypertension and dyslipidemia.  She has done quite well.  She has some mild chronic kidney disease.  No concerns or complaints today  Past Medical History  Diagnosis Date  . CARCINOMA, COLON 02/03/2007  . HYPERLIPIDEMIA 06/19/2007  . Unspecified essential hypertension 02/03/2007  . HYPOTENSION, ORTHOSTATIC 12/24/2007  . PANCREATITIS, ACUTE 12/27/2007  . RENAL FAILURE, ACUTE 12/27/2007  . KIDNEY STONE 02/04/2007  . 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 Lumbago 03/13/2007  . SYNCOPE 03/13/2007  . Dysuria 02/04/2007  . NEPHROLITHIASIS, HX OF 06/19/2007  . LEG PAIN, LEFT 05/14/2010    Social History   Social History  . Marital Status: Married    Spouse Name: N/A  . Number of Children: N/A  . Years of Education: N/A   Occupational History  . Not on file.   Social History Main Topics  . Smoking status: Never Smoker   . Smokeless tobacco: Never Used  . Alcohol Use: No  . Drug Use: No  . Sexual Activity: Not on file   Other Topics Concern  . Not on file   Social History Narrative    Past Surgical History  Procedure Laterality Date  . Colon surgery  2002  . Cystoscopy      Family History  Problem Relation Age of Onset  . Cirrhosis Mother 55    nonalcholic  . Leukemia Father     Allergies  Allergen Reactions  . Amoxicillin-Pot Clavulanate     REACTION: hives    Current Outpatient Prescriptions on File Prior to Visit  Medication Sig Dispense Refill  . amLODipine (NORVASC) 5 MG tablet TAKE 1 TABLET BY MOUTH EVERY DAY 90 tablet 1  . Flaxseed, Linseed, (FLAXSEED OIL) 1000 MG CAPS Take 2,000 mg by mouth daily.     . hydrocortisone cream 1 % Apply topically 2 (two) times daily. Or LOTION, available over the counter. Apply to the rash until heals. 30 g 0  . metoprolol (LOPRESSOR) 50 MG tablet TAKE 1/2 TABLET BY MOUTH TWICE  DAILY 90 tablet 1  . Multiple Vitamins-Minerals (ONE-A-DAY WOMENS 50 PLUS PO) Take 1 tablet by mouth daily.    . Omega-3 Fatty Acids (FISH OIL) 1200 MG CAPS Take by mouth daily.      . Ostomy Supplies (COLOPLAST DRAINABLE POUCH) POUCH MISC 1 each by Does not apply route as needed. 20 each 3   No current facility-administered medications on file prior to visit.    BP 140/80 mmHg  Pulse 96  Temp(Src) 98.1 F (36.7 C) (Oral)  Resp 20  Ht 5\' 6"  (1.676 m)  Wt 166 lb (75.297 kg)  BMI 26.81 kg/m2  SpO2 97%      Review of Systems  Constitutional: Negative.   HENT: Negative for congestion, dental problem, hearing loss, rhinorrhea, sinus pressure, sore throat and tinnitus.   Eyes: Negative for pain, discharge and visual disturbance.  Respiratory: Negative for cough and shortness of breath.   Cardiovascular: Positive for leg swelling. Negative for chest pain and palpitations.  Gastrointestinal: Negative for nausea, vomiting, abdominal pain, diarrhea, constipation, blood in stool and abdominal distention.  Genitourinary: Negative for dysuria, urgency, frequency, hematuria, flank pain, vaginal bleeding, vaginal discharge, difficulty urinating, vaginal pain and pelvic pain.  Musculoskeletal: Negative for joint swelling, arthralgias and gait problem.  Skin: Negative for rash.  Neurological: Negative for dizziness, syncope, speech  difficulty, weakness, numbness and headaches.  Hematological: Negative for adenopathy.  Psychiatric/Behavioral: Negative for behavioral problems, dysphoric mood and agitation. The patient is not nervous/anxious.        Objective:   Physical Exam  Constitutional: She is oriented to person, place, and time. She appears well-developed and well-nourished.  HENT:  Head: Normocephalic.  Right Ear: External ear normal.  Left Ear: External ear normal.  Mouth/Throat: Oropharynx is clear and moist.  Eyes: Conjunctivae and EOM are normal. Pupils are equal, round, and  reactive to light.  Neck: Normal range of motion. Neck supple. No thyromegaly present.  Cardiovascular: Normal rate, regular rhythm, normal heart sounds and intact distal pulses.   Pulmonary/Chest: Effort normal and breath sounds normal.  Abdominal: Soft. Bowel sounds are normal. She exhibits no mass. There is no tenderness.  Musculoskeletal: Normal range of motion. She exhibits edema.  Trace lower extremity edema  Lymphadenopathy:    She has no cervical adenopathy.  Neurological: She is alert and oriented to person, place, and time.  Skin: Skin is warm and dry. No rash noted.  Psychiatric: She has a normal mood and affect. Her behavior is normal.          Assessment & Plan:   Hypertension, well-controlled dyslipidemia History chronic kidney disease  Check lab at this time her annual exam in 4 months

## 2015-03-17 NOTE — Patient Instructions (Signed)
Limit your sodium (Salt) intake  Please check your blood pressure on a regular basis.  If it is consistently greater than 150/90, please make an office appointment.  Return in 4 months for follow-up  

## 2015-03-17 NOTE — Progress Notes (Signed)
Pre visit review using our clinic review tool, if applicable. No additional management support is needed unless otherwise documented below in the visit note. 

## 2015-03-20 ENCOUNTER — Other Ambulatory Visit: Payer: Self-pay | Admitting: Internal Medicine

## 2015-04-20 ENCOUNTER — Other Ambulatory Visit: Payer: Self-pay | Admitting: Internal Medicine

## 2015-07-26 ENCOUNTER — Ambulatory Visit (INDEPENDENT_AMBULATORY_CARE_PROVIDER_SITE_OTHER): Payer: Medicare Other | Admitting: Internal Medicine

## 2015-07-26 ENCOUNTER — Encounter: Payer: Self-pay | Admitting: Internal Medicine

## 2015-07-26 VITALS — BP 132/80 | HR 57 | Temp 98.0°F | Resp 20 | Ht 62.0 in | Wt 170.0 lb

## 2015-07-26 DIAGNOSIS — M4806 Spinal stenosis, lumbar region: Secondary | ICD-10-CM

## 2015-07-26 DIAGNOSIS — C189 Malignant neoplasm of colon, unspecified: Secondary | ICD-10-CM | POA: Diagnosis not present

## 2015-07-26 DIAGNOSIS — M48061 Spinal stenosis, lumbar region without neurogenic claudication: Secondary | ICD-10-CM

## 2015-07-26 DIAGNOSIS — Z0000 Encounter for general adult medical examination without abnormal findings: Secondary | ICD-10-CM

## 2015-07-26 DIAGNOSIS — I10 Essential (primary) hypertension: Secondary | ICD-10-CM | POA: Diagnosis not present

## 2015-07-26 DIAGNOSIS — E785 Hyperlipidemia, unspecified: Secondary | ICD-10-CM | POA: Diagnosis not present

## 2015-07-26 DIAGNOSIS — Z87442 Personal history of urinary calculi: Secondary | ICD-10-CM | POA: Diagnosis not present

## 2015-07-26 LAB — COMPREHENSIVE METABOLIC PANEL
ALBUMIN: 4 g/dL (ref 3.5–5.2)
ALT: 12 U/L (ref 0–35)
AST: 16 U/L (ref 0–37)
Alkaline Phosphatase: 58 U/L (ref 39–117)
BUN: 14 mg/dL (ref 6–23)
CALCIUM: 9.8 mg/dL (ref 8.4–10.5)
CHLORIDE: 108 meq/L (ref 96–112)
CO2: 28 mEq/L (ref 19–32)
Creatinine, Ser: 1.6 mg/dL — ABNORMAL HIGH (ref 0.40–1.20)
GFR: 31.95 mL/min — ABNORMAL LOW (ref >60.00)
Glucose, Bld: 109 mg/dL — ABNORMAL HIGH (ref 70–99)
Potassium: 4.5 mEq/L (ref 3.5–5.1)
Sodium: 146 mEq/L — ABNORMAL HIGH (ref 135–145)
TOTAL PROTEIN: 6.6 g/dL (ref 6.0–8.3)
Total Bilirubin: 0.7 mg/dL (ref 0.2–1.2)

## 2015-07-26 LAB — TSH: TSH: 3.84 u[IU]/mL (ref 0.35–4.50)

## 2015-07-26 LAB — CBC WITH DIFFERENTIAL/PLATELET
BASOS ABS: 0.1 10*3/uL (ref 0.0–0.1)
BASOS PCT: 0.9 % (ref 0.0–3.0)
Eosinophils Absolute: 0.1 10*3/uL (ref 0.0–0.7)
Eosinophils Relative: 1.8 % (ref 0.0–5.0)
HCT: 41.6 % (ref 36.0–46.0)
HEMOGLOBIN: 13.5 g/dL (ref 12.0–15.0)
LYMPHS PCT: 31.7 % (ref 12.0–46.0)
Lymphs Abs: 2.5 10*3/uL (ref 0.7–4.0)
MCHC: 32.5 g/dL (ref 30.0–36.0)
MCV: 93.8 fl (ref 78.0–100.0)
MONO ABS: 0.5 10*3/uL (ref 0.1–1.0)
Monocytes Relative: 5.9 % (ref 3.0–12.0)
NEUTROS PCT: 59.7 % (ref 43.0–77.0)
Neutro Abs: 4.8 10*3/uL (ref 1.4–7.7)
Platelets: 205 10*3/uL (ref 150.0–400.0)
RBC: 4.44 Mil/uL (ref 3.87–5.11)
RDW: 13.9 % (ref 11.5–15.5)
WBC: 8 10*3/uL (ref 4.0–10.5)

## 2015-07-26 NOTE — Progress Notes (Signed)
Pre visit review using our clinic review tool, if applicable. No additional management support is needed unless otherwise documented below in the visit note. 

## 2015-07-26 NOTE — Progress Notes (Signed)
Subjective:    Patient ID: [name], female    DOB: 10/04/1919, 82 y.o.   MRN: [id number]  HPI 52 -year-old patient who is seen today for her annual preventive health exam.  She has a history of colonic cancer status post colostomy following resection in 2008.  She was admitted to the hospital for a small bowel obstruction secondary to a peristomal hernia approximately 10 months ago. She has essential hypertension which has been well controlled.  She has a history of mild dyslipidemia. Medical regimen consist of antihypertensive medications, only    Past Medical History  Diagnosis Date  . CARCINOMA, COLON 02/03/2007  . HYPERLIPIDEMIA 06/19/2007  . Unspecified essential hypertension 02/03/2007  . HYPOTENSION, ORTHOSTATIC 12/24/2007  . PANCREATITIS, ACUTE 12/27/2007  . RENAL FAILURE, ACUTE 12/27/2007  . KIDNEY STONE 02/04/2007  . 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 Lumbago 03/13/2007  . SYNCOPE 03/13/2007  . Dysuria 02/04/2007  . NEPHROLITHIASIS, HX OF 06/19/2007  . LEG PAIN, LEFT 05/14/2010    Social History   Social History  . Marital Status: Married    Spouse Name: N/A  . Number of Children: N/A  . Years of Education: N/A   Occupational History  . Not on file.   Social History Main Topics  . Smoking status: Never Smoker   . Smokeless tobacco: Never Used  . Alcohol Use: No  . Drug Use: No  . Sexual Activity: Not on file   Other Topics Concern  . Not on file   Social History Narrative    Past Surgical History  Procedure Laterality Date  . Colon surgery  2002  . Cystoscopy      Family History  Problem Relation Age of Onset  . Cirrhosis Mother 51    nonalcholic  . Leukemia Father     Allergies  Allergen Reactions  . Amoxicillin-Pot Clavulanate     REACTION: hives    Current Outpatient Prescriptions on File Prior to Visit  Medication Sig Dispense Refill  . amLODipine (NORVASC) 5 MG tablet TAKE 1 TABLET BY MOUTH EVERY DAY 90 tablet 1  .  Flaxseed, Linseed, (FLAXSEED OIL) 1000 MG CAPS Take 2,000 mg by mouth daily.     . hydrocortisone cream 1 % Apply topically 2 (two) times daily. Or LOTION, available over the counter. Apply to the rash until heals. 30 g 0  . metoprolol (LOPRESSOR) 50 MG tablet TAKE 1/2 TABLET BY MOUTH TWICE DAILY 90 tablet 1  . Multiple Vitamins-Minerals (ONE-A-DAY WOMENS 50 PLUS PO) Take 1 tablet by mouth daily.    . Omega-3 Fatty Acids (FISH OIL) 1200 MG CAPS Take by mouth daily.      . Ostomy Supplies (COLOPLAST DRAINABLE POUCH) POUCH MISC 1 each by Does not apply route as needed. 20 each 3   No current facility-administered medications on file prior to visit.    There were no vitals taken for this visit.   1. Risk factors, based on past  M,S,F history- Age HLD HTN  2.  Physical activities: limited  due to age and debility  3.  Depression/mood: no h/o major depression  4.  Hearing: moderate impairment  5.  ADL's: requires assistance in all ADL  6.  Fall risk: high  7.  Home safety: none IDed  8.  Height weight, and visual acuity; stable no visual concerns  9.  Counseling: fall prevention discussed  10. Lab orders based on risk factors: will review screening lab  11. Referral : none appropriate  12. Care plan: continue heart healthy diet and BP control  13. Cognitive assessment: alert and appropriate; bright affect  14. Screening: Patient provided with a written and personalized 5-10 year screening schedule in the AVS.  Continue bi annual followup  15. Provider List Update: PC only      Review of Systems  Constitutional: Negative.   HENT: Negative for congestion, dental problem, hearing loss, rhinorrhea, sinus pressure, sore throat and tinnitus.   Eyes: Negative for pain, discharge and visual disturbance.  Respiratory: Negative for cough and shortness of breath.   Cardiovascular: Negative for chest pain, palpitations and leg swelling.  Gastrointestinal: Negative for nausea,  vomiting, abdominal pain, diarrhea, constipation, blood in stool and abdominal distention.  Genitourinary: Negative for dysuria, urgency, frequency, hematuria, flank pain, vaginal bleeding, vaginal discharge, difficulty urinating, vaginal pain and pelvic pain.  Musculoskeletal: Positive for gait problem. Negative for joint swelling and arthralgias.  Skin: Negative for rash.  Neurological: Negative for dizziness, syncope, speech difficulty, weakness, numbness and headaches.  Hematological: Negative for adenopathy.  Psychiatric/Behavioral: Negative for behavioral problems, dysphoric mood and agitation. The patient is not nervous/anxious.        Objective:   Physical Exam  Constitutional: She is oriented to person, place, and time. She appears well-developed and well-nourished. No distress.  Blood pressure 130/80  HENT:  Head: Normocephalic and atraumatic.  Right Ear: External ear normal.  Left Ear: External ear normal.  Mouth/Throat: Oropharynx is clear and moist.  Eyes: Conjunctivae and EOM are normal.  Neck: Normal range of motion. Neck supple. No JVD present. No thyromegaly present.  Cardiovascular: Normal rate, regular rhythm, normal heart sounds and intact distal pulses.   No murmur heard. Pulmonary/Chest: Effort normal and breath sounds normal. She has no wheezes. She has no rales.  Abdominal: Soft. Bowel sounds are normal. She exhibits no distension and no mass. There is no tenderness. There is no rebound and no guarding.  Lower midline scar Right lower quadrant colostomy  Musculoskeletal: Normal range of motion. She exhibits edema. She exhibits no tenderness.  Plus 2 ankle and peel edema  Neurological: She is alert and oriented to person, place, and time. She has normal reflexes. No cranial nerve deficit. She exhibits normal muscle tone. Coordination normal.  Skin: Skin is warm and dry. No rash noted.  Psychiatric: She has a normal mood and affect. Her behavior is normal.           Assessment & Plan:  Preventive health care Hypertension.  Well controlled History of colon cancer, status post colostomy History of chronic kidney disease Lumbar spinal stenosis  We'll check updated lab including CBC and renal indices No change in present regimen Low-salt diet recommended  Recheck 6 months

## 2015-07-26 NOTE — Patient Instructions (Signed)
Limit your sodium (Salt) intake  Please check your blood pressure on a regular basis.  If it is consistently greater than 150/90, please make an office appointment.  Return in 6 months for follow-up  Take a calcium supplement, plus 800-1200 units of vitamin D 

## 2015-08-21 ENCOUNTER — Encounter: Payer: Self-pay | Admitting: Internal Medicine

## 2015-08-21 ENCOUNTER — Ambulatory Visit (INDEPENDENT_AMBULATORY_CARE_PROVIDER_SITE_OTHER): Payer: Medicare Other | Admitting: Internal Medicine

## 2015-08-21 VITALS — BP 158/90 | HR 82 | Temp 98.0°F | Resp 12 | Ht 62.0 in | Wt 164.0 lb

## 2015-08-21 DIAGNOSIS — C189 Malignant neoplasm of colon, unspecified: Secondary | ICD-10-CM | POA: Diagnosis not present

## 2015-08-21 DIAGNOSIS — I10 Essential (primary) hypertension: Secondary | ICD-10-CM | POA: Diagnosis not present

## 2015-08-21 DIAGNOSIS — L723 Sebaceous cyst: Secondary | ICD-10-CM

## 2015-08-21 DIAGNOSIS — N183 Chronic kidney disease, stage 3 unspecified: Secondary | ICD-10-CM

## 2015-08-21 DIAGNOSIS — L089 Local infection of the skin and subcutaneous tissue, unspecified: Secondary | ICD-10-CM

## 2015-08-21 NOTE — Patient Instructions (Signed)
Sebaceous Cyst Removal, Care After Refer to this sheet in the next few weeks. These instructions provide you with information about caring for yourself after your procedure. Your health care provider may also give you more specific instructions. Your treatment has been planned according to current medical practices, but problems sometimes occur. Call your health care provider if you have any problems or questions after your procedure. WHAT TO EXPECT AFTER THE PROCEDURE After your procedure, it is common to have:  Soreness in the area where your cyst was removed.  Tightness or itching from your skin sutures. HOME CARE INSTRUCTIONS  Take medicines only as directed by your health care provider.  If you were prescribed an antibiotic medicine, finish all of it even if you start to feel better.  Use antibiotic ointment as directed by your health care provider. Follow the instructions carefully.  There are many different ways to close and cover an incision, including stitches (sutures), skin glue, and adhesive strips. Follow your health care provider's instructions about:  Incision care.  Bandage (dressing) changes and removal.  Incision closure removal.  Keep the bandage (dressing) dry until your health care provider says that it can be removed. Take sponge baths only. Ask your health care provider when you can start showering or taking a bath.  After your dressing is off, check your incision every day for signs of infection. Watch for:  Redness, swelling, or pain.  Fluid, blood, or pus.  You can return to your normal activities. Do not do anything that stretches or puts pressure on your incision.  You can return to your normal diet.  Keep all follow-up visits as directed by your health care provider. This is important. SEEK MEDICAL CARE IF:  You have a fever.  Your incision bleeds.  You have redness, swelling, or pain in the incision area.  You have fluid, blood, or pus coming  from your incision.  Your cyst comes back after surgery.   This information is not intended to replace advice given to you by your health care provider. Make sure you discuss any questions you have with your health care provider.   Document Released: 04/22/2014 Document Reviewed: 04/22/2014 Elsevier Interactive Patient Education [employer].

## 2015-08-21 NOTE — Progress Notes (Signed)
Subjective:    Patient ID: [name], female    DOB: December 15, 1919, 82 y.o.   MRN: [id number]  HPI  82 year old patient who has noted a small nodule in the right postauricular area for a few months.  Yesterday.  This area became erythematous and tender.  There is been no fever.  Nodule is painful to touch.  Past Medical History  Diagnosis Date  . CARCINOMA, COLON 02/03/2007  . HYPERLIPIDEMIA 06/19/2007  . Unspecified essential hypertension 02/03/2007  . HYPOTENSION, ORTHOSTATIC 12/24/2007  . PANCREATITIS, ACUTE 12/27/2007  . RENAL FAILURE, ACUTE 12/27/2007  . KIDNEY STONE 02/04/2007  . 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 Lumbago 03/13/2007  . SYNCOPE 03/13/2007  . Dysuria 02/04/2007  . NEPHROLITHIASIS, HX OF 06/19/2007  . LEG PAIN, LEFT 05/14/2010     Social History   Social History  . Marital Status: Married    Spouse Name: N/A  . Number of Children: N/A  . Years of Education: N/A   Occupational History  . Not on file.   Social History Main Topics  . Smoking status: Never Smoker   . Smokeless tobacco: Never Used  . Alcohol Use: No  . Drug Use: No  . Sexual Activity: Not on file   Other Topics Concern  . Not on file   Social History Narrative    Past Surgical History  Procedure Laterality Date  . Colon surgery  2002  . Cystoscopy      Family History  Problem Relation Age of Onset  . Cirrhosis Mother 82    nonalcholic  . Leukemia Father     Allergies  Allergen Reactions  . Amoxicillin-Pot Clavulanate     REACTION: hives    Current Outpatient Prescriptions on File Prior to Visit  Medication Sig Dispense Refill  . amLODipine (NORVASC) 5 MG tablet TAKE 1 TABLET BY MOUTH EVERY DAY 90 tablet 1  . Flaxseed, Linseed, (FLAXSEED OIL) 1000 MG CAPS Take 2,000 mg by mouth daily.     . hydrocortisone cream 1 % Apply topically 2 (two) times daily. Or LOTION, available over the counter. Apply to the rash until heals. 30 g 0  . metoprolol (LOPRESSOR) 50 MG  tablet TAKE 1/2 TABLET BY MOUTH TWICE DAILY 90 tablet 1  . Multiple Vitamins-Minerals (ONE-A-DAY WOMENS 50 PLUS PO) Take 1 tablet by mouth daily.    . Omega-3 Fatty Acids (FISH OIL) 1200 MG CAPS Take by mouth daily.      . Ostomy Supplies (COLOPLAST DRAINABLE POUCH) POUCH MISC 1 each by Does not apply route as needed. 20 each 3   No current facility-administered medications on file prior to visit.    BP 158/90 mmHg  Pulse 82  Temp(Src) 98 F (36.7 C) (Oral)  Resp 12  Ht 5\' 2"  (1.575 m)  Wt 164 lb (74.39 kg)  BMI 29.99 kg/m2  SpO2 98%     Review of Systems  Constitutional: Negative.   HENT: Negative for congestion, dental problem, hearing loss, rhinorrhea, sinus pressure, sore throat and tinnitus.   Eyes: Negative for pain, discharge and visual disturbance.  Respiratory: Negative for cough and shortness of breath.   Cardiovascular: Negative for chest pain, palpitations and leg swelling.  Gastrointestinal: Negative for nausea, vomiting, abdominal pain, diarrhea, constipation, blood in stool and abdominal distention.  Genitourinary: Negative for dysuria, urgency, frequency, hematuria, flank pain, vaginal bleeding, vaginal discharge, difficulty urinating, vaginal pain and pelvic pain.  Musculoskeletal: Negative for joint swelling, arthralgias and gait problem.  Skin: Positive  for rash.  Neurological: Negative for dizziness, syncope, speech difficulty, weakness, numbness and headaches.  Hematological: Negative for adenopathy.  Psychiatric/Behavioral: Negative for behavioral problems, dysphoric mood and agitation. The patient is not nervous/anxious.        Objective:   Physical Exam  Constitutional: She appears well-developed and well-nourished. No distress.  Skin:  1.5 centimeter tender, erythematous nodule with a central pustule noted in the right postauricular area          Assessment & Plan:   Inflamed sebaceous cyst, right posterior auricular area  Procedure note.   After informed consent, the area topically anesthetized.  A small incision was made with a scalpel and the contents of the nodule evacuated.  Antibiotic ointment and a Band-Aid applied Local wound care discussed

## 2015-08-21 NOTE — Progress Notes (Signed)
Pre visit review using our clinic review tool, if applicable. No additional management support is needed unless otherwise documented below in the visit note. 

## 2015-09-12 ENCOUNTER — Other Ambulatory Visit: Payer: Self-pay | Admitting: Internal Medicine

## 2015-10-15 ENCOUNTER — Inpatient Hospital Stay (HOSPITAL_COMMUNITY)
Admission: EM | Admit: 2015-10-15 | Discharge: 2015-11-14 | DRG: 871 | Disposition: E | Payer: Medicare Other | Attending: Internal Medicine | Admitting: Internal Medicine

## 2015-10-15 ENCOUNTER — Encounter (HOSPITAL_COMMUNITY): Payer: Self-pay | Admitting: Emergency Medicine

## 2015-10-15 ENCOUNTER — Emergency Department (HOSPITAL_COMMUNITY): Payer: Medicare Other

## 2015-10-15 DIAGNOSIS — K435 Parastomal hernia without obstruction or  gangrene: Secondary | ICD-10-CM | POA: Diagnosis present

## 2015-10-15 DIAGNOSIS — Z85038 Personal history of other malignant neoplasm of large intestine: Secondary | ICD-10-CM | POA: Diagnosis not present

## 2015-10-15 DIAGNOSIS — R1084 Generalized abdominal pain: Secondary | ICD-10-CM | POA: Diagnosis not present

## 2015-10-15 DIAGNOSIS — I10 Essential (primary) hypertension: Secondary | ICD-10-CM | POA: Diagnosis present

## 2015-10-15 DIAGNOSIS — E876 Hypokalemia: Secondary | ICD-10-CM | POA: Diagnosis present

## 2015-10-15 DIAGNOSIS — Z9049 Acquired absence of other specified parts of digestive tract: Secondary | ICD-10-CM

## 2015-10-15 DIAGNOSIS — R000 Tachycardia, unspecified: Secondary | ICD-10-CM | POA: Diagnosis present

## 2015-10-15 DIAGNOSIS — E785 Hyperlipidemia, unspecified: Secondary | ICD-10-CM | POA: Diagnosis present

## 2015-10-15 DIAGNOSIS — A419 Sepsis, unspecified organism: Principal | ICD-10-CM | POA: Diagnosis present

## 2015-10-15 DIAGNOSIS — Z515 Encounter for palliative care: Secondary | ICD-10-CM | POA: Diagnosis not present

## 2015-10-15 DIAGNOSIS — Z66 Do not resuscitate: Secondary | ICD-10-CM | POA: Diagnosis present

## 2015-10-15 DIAGNOSIS — N170 Acute kidney failure with tubular necrosis: Secondary | ICD-10-CM | POA: Diagnosis not present

## 2015-10-15 DIAGNOSIS — K631 Perforation of intestine (nontraumatic): Secondary | ICD-10-CM | POA: Diagnosis not present

## 2015-10-15 DIAGNOSIS — Z933 Colostomy status: Secondary | ICD-10-CM | POA: Diagnosis not present

## 2015-10-15 DIAGNOSIS — E7800 Pure hypercholesterolemia, unspecified: Secondary | ICD-10-CM | POA: Diagnosis present

## 2015-10-15 DIAGNOSIS — R69 Illness, unspecified: Secondary | ICD-10-CM | POA: Diagnosis not present

## 2015-10-15 DIAGNOSIS — J982 Interstitial emphysema: Secondary | ICD-10-CM | POA: Diagnosis present

## 2015-10-15 DIAGNOSIS — R198 Other specified symptoms and signs involving the digestive system and abdomen: Secondary | ICD-10-CM

## 2015-10-15 DIAGNOSIS — Z880 Allergy status to penicillin: Secondary | ICD-10-CM

## 2015-10-15 DIAGNOSIS — N179 Acute kidney failure, unspecified: Secondary | ICD-10-CM | POA: Diagnosis present

## 2015-10-15 LAB — COMPREHENSIVE METABOLIC PANEL
ALT: 21 U/L (ref 14–54)
AST: 30 U/L (ref 15–41)
Albumin: 3.4 g/dL — ABNORMAL LOW (ref 3.5–5.0)
Alkaline Phosphatase: 53 U/L (ref 38–126)
Anion gap: 12 (ref 5–15)
BUN: 16 mg/dL (ref 6–20)
CHLORIDE: 106 mmol/L (ref 101–111)
CO2: 21 mmol/L — ABNORMAL LOW (ref 22–32)
Calcium: 9.9 mg/dL (ref 8.9–10.3)
Creatinine, Ser: 1.7 mg/dL — ABNORMAL HIGH (ref 0.44–1.00)
GFR calc Af Amer: 29 mL/min — ABNORMAL LOW (ref >60)
GFR calc non Af Amer: 25 mL/min — ABNORMAL LOW (ref >60)
Glucose, Bld: 167 mg/dL — ABNORMAL HIGH (ref 65–99)
POTASSIUM: 3.3 mmol/L — AB (ref 3.5–5.1)
Sodium: 139 mmol/L (ref 135–145)
Total Bilirubin: 1.1 mg/dL (ref 0.3–1.2)
Total Protein: 6.6 g/dL (ref 6.5–8.1)

## 2015-10-15 LAB — CBC
HCT: 45.8 % (ref 36.0–46.0)
Hemoglobin: 14.5 g/dL (ref 12.0–15.0)
MCH: 30.4 pg (ref 26.0–34.0)
MCHC: 31.7 g/dL (ref 30.0–36.0)
MCV: 96 fL (ref 78.0–100.0)
Platelets: 170 10*3/uL (ref 150–400)
RBC: 4.77 MIL/uL (ref 3.87–5.11)
RDW: 13 % (ref 11.5–15.5)
WBC: 7.9 10*3/uL (ref 4.0–10.5)

## 2015-10-15 LAB — URINALYSIS, ROUTINE W REFLEX MICROSCOPIC
GLUCOSE, UA: NEGATIVE mg/dL
Hgb urine dipstick: NEGATIVE
Ketones, ur: 15 mg/dL — AB
Nitrite: NEGATIVE
Protein, ur: 100 mg/dL — AB
SPECIFIC GRAVITY, URINE: 1.032 — AB (ref 1.005–1.030)
pH: 5.5 (ref 5.0–8.0)

## 2015-10-15 LAB — ABO/RH: ABO/RH(D): O POS

## 2015-10-15 LAB — URINE MICROSCOPIC-ADD ON

## 2015-10-15 LAB — TYPE AND SCREEN
ABO/RH(D): O POS
Antibody Screen: NEGATIVE

## 2015-10-15 LAB — I-STAT CG4 LACTIC ACID, ED: LACTIC ACID, VENOUS: 5.53 mmol/L — AB (ref 0.5–1.9)

## 2015-10-15 LAB — LIPASE, BLOOD: LIPASE: 203 U/L — AB (ref 11–51)

## 2015-10-15 MED ORDER — MORPHINE SULFATE (PF) 4 MG/ML IV SOLN
4.0000 mg | INTRAVENOUS | Status: DC | PRN
  Administered 2015-10-15 (×2): 4 mg via INTRAVENOUS
  Filled 2015-10-15 (×2): qty 1

## 2015-10-15 MED ORDER — SODIUM CHLORIDE 0.9 % IV SOLN
INTRAVENOUS | Status: DC
  Administered 2015-10-15 – 2015-10-16 (×2): via INTRAVENOUS

## 2015-10-15 MED ORDER — METRONIDAZOLE IN NACL 5-0.79 MG/ML-% IV SOLN
500.0000 mg | Freq: Once | INTRAVENOUS | Status: AC
  Administered 2015-10-15: 500 mg via INTRAVENOUS
  Filled 2015-10-15: qty 100

## 2015-10-15 MED ORDER — METOPROLOL TARTRATE 25 MG PO TABS
25.0000 mg | ORAL_TABLET | Freq: Two times a day (BID) | ORAL | Status: DC
  Administered 2015-10-15: 25 mg via ORAL
  Filled 2015-10-15: qty 1

## 2015-10-15 MED ORDER — SODIUM CHLORIDE 0.9 % IV SOLN
Freq: Once | INTRAVENOUS | Status: AC
  Administered 2015-10-15: 05:00:00 via INTRAVENOUS

## 2015-10-15 MED ORDER — MORPHINE SULFATE (PF) 2 MG/ML IV SOLN
1.0000 mg | INTRAVENOUS | Status: DC | PRN
  Administered 2015-10-15 – 2015-10-16 (×2): 1 mg via INTRAVENOUS
  Filled 2015-10-15 (×2): qty 1

## 2015-10-15 MED ORDER — CIPROFLOXACIN IN D5W 400 MG/200ML IV SOLN
400.0000 mg | Freq: Once | INTRAVENOUS | Status: AC
  Administered 2015-10-15: 400 mg via INTRAVENOUS
  Filled 2015-10-15: qty 200

## 2015-10-15 MED ORDER — SODIUM CHLORIDE 0.9 % IV SOLN
1.0000 mg/h | INTRAVENOUS | Status: DC
  Administered 2015-10-15: 0.5 mg/h via INTRAVENOUS
  Administered 2015-10-17 – 2015-10-21 (×4): 1 mg/h via INTRAVENOUS
  Filled 2015-10-15 (×4): qty 5

## 2015-10-15 MED ORDER — ONDANSETRON HCL 4 MG/2ML IJ SOLN
4.0000 mg | Freq: Once | INTRAMUSCULAR | Status: AC
  Administered 2015-10-15: 4 mg via INTRAVENOUS
  Filled 2015-10-15: qty 2

## 2015-10-15 MED ORDER — POTASSIUM CHLORIDE 10 MEQ/100ML IV SOLN
10.0000 meq | INTRAVENOUS | Status: AC
  Administered 2015-10-15 (×2): 10 meq via INTRAVENOUS
  Filled 2015-10-15 (×2): qty 100

## 2015-10-15 MED ORDER — SODIUM CHLORIDE 0.9 % IV SOLN
Freq: Once | INTRAVENOUS | Status: AC
  Administered 2015-10-15: 08:00:00 via INTRAVENOUS

## 2015-10-15 MED ORDER — METRONIDAZOLE IN NACL 5-0.79 MG/ML-% IV SOLN
500.0000 mg | Freq: Three times a day (TID) | INTRAVENOUS | Status: DC
  Administered 2015-10-15 – 2015-10-16 (×3): 500 mg via INTRAVENOUS
  Filled 2015-10-15 (×6): qty 100

## 2015-10-15 MED ORDER — LEVOFLOXACIN IN D5W 750 MG/150ML IV SOLN
750.0000 mg | INTRAVENOUS | Status: DC
  Administered 2015-10-16: 750 mg via INTRAVENOUS
  Filled 2015-10-15 (×2): qty 150

## 2015-10-15 NOTE — Progress Notes (Signed)
Pharmacy Antibiotic Note  [name] is a 82 y.o. female admitted on 10/25/2015 with abdominal pain and found via work-up to have a perforated viscous along with pneumomediastinum. Pharmacy has been consulted for Levaquin along with Flagyl per MD  The patient received a dose of Cipro 400 mg @ 0730 this AM. Will wait to start Levaquin dosing on 7/3 AM, SCr 1.7, CrCl~  Plan: 1. Start Levaquin 750 mg IV every 48 hours on 7/3 AM 2. Continue Flagyl 500 mg IV every 8 hours per MD 3. Will continue to follow renal function, culture results, LOT, and antibiotic de-escalation plans      Temp (24hrs), Avg:98.5 F (36.9 C), Min:98.2 F (36.8 C), Max:98.7 F (37.1 C)   Recent Labs Lab 10/25/2015 0330 11/11/2015 0808  WBC 7.9  --   CREATININE 1.70*  --   LATICACIDVEN  --  5.53*    CrCl cannot be calculated (Unknown ideal weight.).    Allergies  Allergen Reactions  . Amoxicillin-Pot Clavulanate     REACTION: hives    Antimicrobials this admission: Cipro 7/2 x 1 LVQ 7/3 >> Flagyl 7/2 >>  Dose adjustments this admission:  Microbiology results: 7/2 UCx >>  Thank you for allowing pharmacy to be a part of this patient's care.  Alycia Rossetti, PharmD, BCPS Clinical Pharmacist Pager: [phone] 10/26/2015 12:59 PM

## 2015-10-15 NOTE — ED Notes (Signed)
Pt. reports generalized abdominal pain with nausea and emesis onset last night , denies diarrhea , no fever or chills.

## 2015-10-15 NOTE — ED Provider Notes (Addendum)
CSN: [id number]     Arrival date & time 10/23/2015  0300 History   First MD Initiated Contact with Patient 10/29/2015 0435     Chief Complaint  Patient presents with  . Abdominal Pain  . Emesis      HPI  She presents for evaluation of abdominal pain with nausea and vomiting. Patient has a history of colon cancer status post hemicolectomy and colostomy proximally 20 years ago. Admitted last year with some abdominal pain and a peristomal hernia. Was in her normal state of health until yesterday evening. Should rather sudden onset of pain began around her stoma. She has not had any output from the stoma since that time. Has had nausea and a few episodes of emesis. No bloody emesis. Not feculent. Has not been febrile.  Past medical history otherwise includes hypertension, hypercholesterolemia. Has had previous episodes of pancreatitis and acute renal failure during hospitalization. She recovered to near normal renal function.  Past Medical History  Diagnosis Date  . CARCINOMA, COLON 02/03/2007  . HYPERLIPIDEMIA 06/19/2007  . Unspecified essential hypertension 02/03/2007  . HYPOTENSION, ORTHOSTATIC 12/24/2007  . PANCREATITIS, ACUTE 12/27/2007  . RENAL FAILURE, ACUTE 12/27/2007  . KIDNEY STONE 02/04/2007  . 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 Lumbago 03/13/2007  . SYNCOPE 03/13/2007  . Dysuria 02/04/2007  . NEPHROLITHIASIS, HX OF 06/19/2007  . LEG PAIN, LEFT 05/14/2010   Past Surgical History  Procedure Laterality Date  . Colon surgery  2002  . Cystoscopy     Family History  Problem Relation Age of Onset  . Cirrhosis Mother 54    nonalcholic  . Leukemia Father    Social History  Substance Use Topics  . Smoking status: Never Smoker   . Smokeless tobacco: Never Used  . Alcohol Use: No   OB History    No data available     Review of Systems  Constitutional: Negative for fever, chills, diaphoresis, appetite change and fatigue.  HENT: Negative for mouth sores, sore throat and trouble  swallowing.   Eyes: Negative for visual disturbance.  Respiratory: Negative for cough, chest tightness, shortness of breath and wheezing.   Cardiovascular: Negative for chest pain.  Gastrointestinal: Positive for nausea, vomiting and abdominal pain. Negative for diarrhea and abdominal distention.  Endocrine: Negative for polydipsia, polyphagia and polyuria.  Genitourinary: Negative for dysuria, frequency and hematuria.  Musculoskeletal: Negative for gait problem.  Skin: Negative for color change, pallor and rash.  Neurological: Negative for dizziness, syncope, light-headedness and headaches.  Hematological: Does not bruise/bleed easily.  Psychiatric/Behavioral: Negative for behavioral problems and confusion.      Allergies  Amoxicillin-pot clavulanate  Home Medications   Prior to Admission medications   Medication Sig Start Date End Date Taking? Authorizing Provider  amLODipine (NORVASC) 5 MG tablet TAKE 1 TABLET BY MOUTH EVERY DAY 09/12/15  Yes Marletta Lor, MD  Flaxseed, Linseed, (FLAXSEED OIL) 1000 MG CAPS Take 2,000 mg by mouth daily.    Yes Historical Provider, MD  metoprolol (LOPRESSOR) 50 MG tablet TAKE 1/2 TABLET BY MOUTH TWICE DAILY 04/20/15  Yes Marletta Lor, MD  Multiple Vitamins-Minerals (ONE-A-DAY WOMENS 50 PLUS PO) Take 1 tablet by mouth daily.   Yes Historical Provider, MD  Omega-3 Fatty Acids (FISH OIL) 1200 MG CAPS Take 1 capsule by mouth daily.    Yes Historical Provider, MD  hydrocortisone cream 1 % Apply topically 2 (two) times daily. Or LOTION, available over the counter. Apply to the rash until heals. Patient not taking: Reported  on 11/02/2015 08/18/14   Ripudeep Krystal Eaton, MD  Ostomy Supplies ([place]) [place] 1 each by Does not apply route as needed. 09/06/11   Marletta Lor, MD   BP 150/82 mmHg  Pulse 113  Temp(Src) 98.2 F (36.8 C) (Oral)  Resp 16  SpO2 95% Physical Exam  Constitutional: She is oriented to person, place,  and time. She appears well-developed and well-nourished. No distress.  HENT:  Head: Normocephalic.  Eyes: Conjunctivae are normal. Pupils are equal, round, and reactive to light. No scleral icterus.  Neck: Normal range of motion. Neck supple. No thyromegaly present.  Cardiovascular: Normal rate and regular rhythm.  Exam reveals no gallop and no friction rub.   No murmur heard. Pulmonary/Chest: Effort normal and breath sounds normal. No respiratory distress. She has no wheezes. She has no rales.  Abdominal: Soft. Bowel sounds are normal. She exhibits distension. There is tenderness. There is no rebound.    Musculoskeletal: Normal range of motion.  Neurological: She is alert and oriented to person, place, and time.  Skin: Skin is warm and dry. No rash noted.  Psychiatric: She has a normal mood and affect. Her behavior is normal.    ED Course  Procedures (including critical care time) Labs Review Labs Reviewed  LIPASE, BLOOD - Abnormal; Notable for the following:    Lipase 203 (*)    All other components within normal limits  COMPREHENSIVE METABOLIC PANEL - Abnormal; Notable for the following:    Potassium 3.3 (*)    CO2 21 (*)    Glucose, Bld 167 (*)    Creatinine, Ser 1.70 (*)    Albumin 3.4 (*)    GFR calc non Af Amer 25 (*)    GFR calc Af Amer 29 (*)    All other components within normal limits  URINALYSIS, ROUTINE W REFLEX MICROSCOPIC (NOT AT [facility]) - Abnormal; Notable for the following:    Color, Urine AMBER (*)    APPearance CLOUDY (*)    Specific Gravity, Urine 1.032 (*)    Bilirubin Urine SMALL (*)    Ketones, ur 15 (*)    Protein, ur 100 (*)    Leukocytes, UA SMALL (*)    All other components within normal limits  URINE MICROSCOPIC-ADD ON - Abnormal; Notable for the following:    Squamous Epithelial / LPF 6-30 (*)    Bacteria, UA FEW (*)    Casts GRANULAR CAST (*)    Crystals CA OXALATE CRYSTALS (*)    All other components within normal limits  URINE CULTURE    CBC  I-STAT CG4 LACTIC ACID, ED  TYPE AND SCREEN    Imaging Review Dg Chest Port 1 View  10/14/2015  CLINICAL DATA:  Recent perforated viscus EXAM: PORTABLE CHEST 1 VIEW COMPARISON:  07/16/2010 FINDINGS: Cardiac shadow is at the upper limits of normal in size. No free air is noted beneath right hemidiaphragm consistent with the given clinical history. Lungs are well aerated bilaterally. No focal infiltrate or sizable effusion is seen. No bony abnormality is noted. IMPRESSION: Free air consistent with the patient's given clinical history. No acute abnormality noted. Electronically Signed   By: Inez Catalina M.D.   On: 10/27/2015 07:33   Ct Renal Stone Study  11/08/2015  CLINICAL DATA:  Abdominal pain around colostomy.  No al put. EXAM: CT ABDOMEN AND PELVIS WITHOUT CONTRAST TECHNIQUE: Multidetector CT imaging of the abdomen and pelvis was performed following the standard protocol without IV contrast. COMPARISON:  08/13/2014 FINDINGS: There is a large volume of extraluminal air throughout the abdomen, pelvis and lower chest. The air most likely originates from a perforation within the peristomal hernia of the right lower quadrant. A large volume of extraluminal air fills the peristomal hernia and then tracks throughout the extraperitoneal spaces of the abdomen and subcutaneous tissues. It also dissects upward around the liver and into a pneumomediastinum. No significant fluid component to the extraluminal collection. In the lower chest, minimal atelectatic opacities in the posterior lung bases. No pneumothorax or effusion. Gallbladder is moderately distended with several calculi. No focal liver lesion. No bile duct dilatation. Pancreas, spleen and left kidney are unremarkable. Right kidney again demonstrates marked atrophy and chronic right hydroureter continuing to the presumed structure in the right hemipelvis. Abdominal aorta is normal in caliber with moderate atherosclerotic calcification. No significant  skeletal lesion. Severe lumbar degenerative disc and facet changes are again evident. IMPRESSION: 1. Large volume of extraluminal air in the right abdominal peristomal hernia from perforation of the herniated colon. The extraluminal air has dissected throughout the extraperitoneal spaces of the abdomen and pelvis, also extended across the midline into the subcutaneous anterior soft tissues and dissected upward into a pneumomediastinum of the lower chest. These results were called by telephone at the time of interpretation on 11/03/2015 at 6:48 am to Dr. Tanna Furry , who verbally acknowledged these results. 2. Other findings not significantly changed from 08/13/2014 include cholelithiasis, chronic right ureteral stricture with profound right renal atrophy, extensive aortoiliac atherosclerotic calcification and severe lumbar degenerative changes. Electronically Signed   By: Andreas Newport M.D.   On: 10/19/2015 06:54   I have personally reviewed and evaluated these images and lab results as part of my medical decision-making.   EKG Interpretation None      MDM   Final diagnoses:  Perforated viscus    Unfortunately CT scan shows large volume intraperitoneal, and intra-hernia free air consistent with probable colonic perforation. May even show some pneumomediastinum as well. I discussed this with the family. Requested surgical, medicine, daily with care consultations.  07:40:  Patient seen in consultation by Dr. Grandville Silos. I appreciate his time in input. Family has decided to not pursue surgical treatment and would like comfort measures only. I await return call from. His care. PCP is [facility], Dr. Burnice Logan.  I will speak with [facility] regarding admission.    Tanna Furry, MD 10/14/2015 0710  Tanna Furry, MD 11/03/2015 [place], MD 10/20/2015 [phone]

## 2015-10-15 NOTE — Consult Note (Signed)
Reason for Consult:bowel perforation Referring Physician: [name] is an 82 y.o. female.  HPI: [name] is known to our practice from colostomy placement 16 years ago by Dr. Alphonsa Overall. This was due to an iatrogenic colon injury during GYN surgery. Her colostomy has been working well. She has had a large parastomal hernia for many years which has not given her very much difficulty. She just returned from a trip to the beach with her family yesterday. She developed increasing pain around her colostomy which overnight progressed to generalized abdominal pain. She came to the emergency department and noncontrast abdominal and pelvic CT demonstrates free air within her peristomal hernia as well as extending along her abdominal wall, intraperitoneally, and up into the mediastinum consistent with colon perforation. She complains of severe abdominal pain. Her daughter is present and assists with the history.  Past Medical History  Diagnosis Date  . CARCINOMA, COLON 02/03/2007  . HYPERLIPIDEMIA 06/19/2007  . Unspecified essential hypertension 02/03/2007  . HYPOTENSION, ORTHOSTATIC 12/24/2007  . PANCREATITIS, ACUTE 12/27/2007  . RENAL FAILURE, ACUTE 12/27/2007  . KIDNEY STONE 02/04/2007  . 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 Lumbago 03/13/2007  . SYNCOPE 03/13/2007  . Dysuria 02/04/2007  . NEPHROLITHIASIS, HX OF 06/19/2007  . LEG PAIN, LEFT 05/14/2010    Past Surgical History  Procedure Laterality Date  . Colon surgery  2002  . Cystoscopy      Family History  Problem Relation Age of Onset  . Cirrhosis Mother 23    nonalcholic  . Leukemia Father     Social History:  reports that she has never smoked. She has never used smokeless tobacco. She reports that she does not drink alcohol or use illicit drugs.  Allergies:  Allergies  Allergen Reactions  . Amoxicillin-Pot Clavulanate     REACTION: hives    Medications: Prior to Admission:  (Not in a hospital  admission)  Results for orders placed or performed during the hospital encounter of 11/12/2015 (from the past 48 hour(s))  Lipase, blood     Status: Abnormal   Collection Time: 11/08/2015  3:30 AM  Result Value Ref Range   Lipase 203 (H) 11 - 51 U/L  Comprehensive metabolic panel     Status: Abnormal   Collection Time: 11/02/2015  3:30 AM  Result Value Ref Range   Sodium 139 135 - 145 mmol/L   Potassium 3.3 (L) 3.5 - 5.1 mmol/L   Chloride 106 101 - 111 mmol/L   CO2 21 (L) 22 - 32 mmol/L   Glucose, Bld 167 (H) 65 - 99 mg/dL   BUN 16 6 - 20 mg/dL   Creatinine, Ser 1.70 (H) 0.44 - 1.00 mg/dL   Calcium 9.9 8.9 - 10.3 mg/dL   Total Protein 6.6 6.5 - 8.1 g/dL   Albumin 3.4 (L) 3.5 - 5.0 g/dL   AST 30 15 - 41 U/L   ALT 21 14 - 54 U/L   Alkaline Phosphatase 53 38 - 126 U/L   Total Bilirubin 1.1 0.3 - 1.2 mg/dL   GFR calc non Af Amer 25 (L) >60 mL/min   GFR calc Af Amer 29 (L) >60 mL/min    Comment: (NOTE) The eGFR has been calculated using the CKD EPI equation. This calculation has not been validated in all clinical situations. eGFR's persistently <60 mL/min signify possible Chronic Kidney Disease.    Anion gap 12 5 - 15  CBC     Status: None   Collection Time: 11/01/2015  3:30 AM  Result Value Ref Range   WBC 7.9 4.0 - 10.5 K/uL   RBC 4.77 3.87 - 5.11 MIL/uL   Hemoglobin 14.5 12.0 - 15.0 g/dL   HCT 48.3 89.3 - 06.8 %   MCV 96.0 78.0 - 100.0 fL   MCH 30.4 26.0 - 34.0 pg   MCHC 31.7 30.0 - 36.0 g/dL   RDW 40.5 02.0 - 35.5 %   Platelets 170 150 - 400 K/uL  Urinalysis, Routine w reflex microscopic     Status: Abnormal   Collection Time: 10/14/2015  3:36 AM  Result Value Ref Range   Color, Urine AMBER (A) YELLOW    Comment: BIOCHEMICALS MAY BE AFFECTED BY COLOR   APPearance CLOUDY (A) CLEAR   Specific Gravity, Urine 1.032 (H) 1.005 - 1.030   pH 5.5 5.0 - 8.0   Glucose, UA NEGATIVE NEGATIVE mg/dL   Hgb urine dipstick NEGATIVE NEGATIVE   Bilirubin Urine SMALL (A) NEGATIVE   Ketones,  ur 15 (A) NEGATIVE mg/dL   Protein, ur 733 (A) NEGATIVE mg/dL   Nitrite NEGATIVE NEGATIVE   Leukocytes, UA SMALL (A) NEGATIVE  Urine microscopic-add on     Status: Abnormal   Collection Time: 11/01/2015  3:36 AM  Result Value Ref Range   Squamous Epithelial / LPF 6-30 (A) NONE SEEN   WBC, UA 6-30 0 - 5 WBC/hpf   RBC / HPF 0-5 0 - 5 RBC/hpf   Bacteria, UA FEW (A) NONE SEEN   Casts GRANULAR CAST (A) NEGATIVE    Comment: HYALINE CASTS   Crystals CA OXALATE CRYSTALS (A) NEGATIVE   Urine-Other AMORPHOUS URATES/PHOSPHATES     Dg Chest Port 1 View  11/01/2015  CLINICAL DATA:  Recent perforated viscus EXAM: PORTABLE CHEST 1 VIEW COMPARISON:  07/16/2010 FINDINGS: Cardiac shadow is at the upper limits of normal in size. No free air is noted beneath right hemidiaphragm consistent with the given clinical history. Lungs are well aerated bilaterally. No focal infiltrate or sizable effusion is seen. No bony abnormality is noted. IMPRESSION: Free air consistent with the patient's given clinical history. No acute abnormality noted. Electronically Signed   By: Alcide Clever M.D.   On: 11/09/2015 07:33   Ct Renal Stone Study  10/29/2015  CLINICAL DATA:  Abdominal pain around colostomy.  No al put. EXAM: CT ABDOMEN AND PELVIS WITHOUT CONTRAST TECHNIQUE: Multidetector CT imaging of the abdomen and pelvis was performed following the standard protocol without IV contrast. COMPARISON:  08/13/2014 FINDINGS: There is a large volume of extraluminal air throughout the abdomen, pelvis and lower chest. The air most likely originates from a perforation within the peristomal hernia of the right lower quadrant. A large volume of extraluminal air fills the peristomal hernia and then tracks throughout the extraperitoneal spaces of the abdomen and subcutaneous tissues. It also dissects upward around the liver and into a pneumomediastinum. No significant fluid component to the extraluminal collection. In the lower chest, minimal  atelectatic opacities in the posterior lung bases. No pneumothorax or effusion. Gallbladder is moderately distended with several calculi. No focal liver lesion. No bile duct dilatation. Pancreas, spleen and left kidney are unremarkable. Right kidney again demonstrates marked atrophy and chronic right hydroureter continuing to the presumed structure in the right hemipelvis. Abdominal aorta is normal in caliber with moderate atherosclerotic calcification. No significant skeletal lesion. Severe lumbar degenerative disc and facet changes are again evident. IMPRESSION: 1. Large volume of extraluminal air in the right abdominal peristomal hernia from perforation of the  herniated colon. The extraluminal air has dissected throughout the extraperitoneal spaces of the abdomen and pelvis, also extended across the midline into the subcutaneous anterior soft tissues and dissected upward into a pneumomediastinum of the lower chest. These results were called by telephone at the time of interpretation on 10/21/2015 at 6:48 am to Dr. Tanna Furry , who verbally acknowledged these results. 2. Other findings not significantly changed from 08/13/2014 include cholelithiasis, chronic right ureteral stricture with profound right renal atrophy, extensive aortoiliac atherosclerotic calcification and severe lumbar degenerative changes. Electronically Signed   By: Andreas Newport M.D.   On: 10/25/2015 06:54    Review of Systems  Unable to perform ROS: medical condition   Blood pressure 152/74, pulse 112, temperature 98.2 F (36.8 C), temperature source Oral, resp. rate 20, SpO2 95 %. Physical Exam  Constitutional: She appears well-developed and well-nourished.  HENT:  Head: Normocephalic.  Right Ear: External ear normal.  Left Ear: External ear normal.  Mouth/Throat: Oropharynx is clear and moist.  Neck: Neck supple. No tracheal deviation present.  Cardiovascular: Normal rate and normal heart sounds.   Respiratory: Effort  normal. No respiratory distress. She has no wheezes. She has rales.  GI: She exhibits distension. There is tenderness. There is rebound and guarding.  Large tender peristomal hernia with generalized peritonitis  Musculoskeletal:  Significant bilateral lower extremity edema  Neurological: She is alert.  Able to answer some questions  Skin: Skin is warm.  Psychiatric: She has a normal mood and affect.    Assessment/Plan: History of right sided colostomy with large parastomal hernia now with evidence of perforation of colon within the hernia with extensive free intraperitoneal air and infection involving the subcutaneous tissues of the abdomen. I discussed these findings in detail with her and her daughter. She is very unlikely to survive this situation no matter what we do. I did describe the option of operative intervention with exploration, bowel resection and leaving her abdomen open with her on the ventilator for an extended period of time with multiple further surgeries. Her daughter describes the ordeal 16 years ago when the colostomy was patent placed as involving one month in the ICU in a very difficult recovery process. The patient and the daughter have opted to go with a palliative care approach. I totally agree. I discussed this with Dr. Jeneen Rinks in the emergency department.  Joell Usman E 11/02/2015, 8:02 AM

## 2015-10-15 NOTE — H&P (Addendum)
TRH H&P   Patient Demographics:    [name], is a 82 y.o. female  MRN: [id number]   DOB - Aug 07, 1919  Admit Date - 11/03/2015  Outpatient Primary MD for the patient is Nyoka Cowden, MD  Referring MD/NP/PA: Tanna Furry  Outpatient Specialists:   Patient coming from: home  Chief Complaint  Patient presents with  . Abdominal Pain  . Emesis      HPI:    [name]  is a 82 y.o. female, w/ hx of colon cancer w colostomy, apparently c/o abdominal pain starting last nite per family.  Pt had dry heaves.  Pt also noted to have decrease in ostomy output per family.  Pt denies fever, chills, constipation, blood per ostomy.  Pt brought to ED by family for evaluation.    In Ed.  CT scan => perforated viscous along with pneumomediastinum  Surgery was consulted by ED and pt and family opted not to pursue surgery due to age and comorbidities.  Palliative care has been consulted. Patient and family are aware of the poor prognosis and patient has decided on DNR. And family is amenable to this.  Pt will be treated with iv abx, and admitted for perforated viscous and pneumomediastinum.     Review of systems:    In addition to the HPI above,  No Fever-chills, No Headache, No changes with Vision or hearing, No problems swallowing food or Liquids, No Chest pain, Cough or Shortness of Breath,  No Blood in stool or Urine, No dysuria, No new skin rashes or bruises, No new joints pains-aches,  No new weakness, tingling, numbness in any extremity, No recent weight gain or loss, No polyuria, polydypsia or polyphagia, No significant Mental Stressors.  A full 10 point Review of Systems was done, except as stated above, all other Review of Systems were negative.   With Past History of the following :    Past Medical History  Diagnosis Date  . CARCINOMA, COLON  02/03/2007  . HYPERLIPIDEMIA 06/19/2007  . Unspecified essential hypertension 02/03/2007  . HYPOTENSION, ORTHOSTATIC 12/24/2007  . PANCREATITIS, ACUTE 12/27/2007  . RENAL FAILURE, ACUTE 12/27/2007  . KIDNEY STONE 02/04/2007  . 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 Lumbago 03/13/2007  . SYNCOPE 03/13/2007  . Dysuria 02/04/2007  . NEPHROLITHIASIS, HX OF 06/19/2007  . LEG PAIN, LEFT 05/14/2010      Past Surgical History  Procedure Laterality Date  . Colon surgery  2002  . Cystoscopy        Social History:     Social History  Substance Use Topics  . Smoking status: Never Smoker   . Smokeless tobacco: Never Used  . Alcohol Use: No     Lives - at home    Family History :     Family History  Problem Relation Age of Onset  . Cirrhosis Mother 54    nonalcholic  .  Leukemia Father       Home Medications:   Prior to Admission medications   Medication Sig Start Date End Date Taking? Authorizing Provider  amLODipine (NORVASC) 5 MG tablet TAKE 1 TABLET BY MOUTH EVERY DAY 09/12/15  Yes Marletta Lor, MD  Flaxseed, Linseed, (FLAXSEED OIL) 1000 MG CAPS Take 2,000 mg by mouth daily.    Yes Historical Provider, MD  metoprolol (LOPRESSOR) 50 MG tablet TAKE 1/2 TABLET BY MOUTH TWICE DAILY 04/20/15  Yes Marletta Lor, MD  Multiple Vitamins-Minerals (ONE-A-DAY WOMENS 50 PLUS PO) Take 1 tablet by mouth daily.   Yes Historical Provider, MD  Omega-3 Fatty Acids (FISH OIL) 1200 MG CAPS Take 1 capsule by mouth daily.    Yes Historical Provider, MD  hydrocortisone cream 1 % Apply topically 2 (two) times daily. Or LOTION, available over the counter. Apply to the rash until heals. Patient not taking: Reported on 10/19/2015 08/18/14   Ripudeep Krystal Eaton, MD  Ostomy Supplies ([place]) [place] 1 each by Does not apply route as needed. 09/06/11   Marletta Lor, MD     Allergies:     Allergies  Allergen Reactions  . Amoxicillin-Pot Clavulanate     REACTION: hives      Physical Exam:   Vitals  Blood pressure 148/86, pulse 117, temperature 98.7 F (37.1 C), temperature source Oral, resp. rate 20, SpO2 97 %.   1. General  lying in bed in NAD,   2. Normal affect and insight, Not Suicidal or Homicidal, Awake Alert,  3. No F.N deficits, ALL C.Nerves Intact, Strength 5/5 all 4 extremities, Sensation intact all 4 extremities, Plantars down going.  4. Ears and Eyes appear Normal, Conjunctivae clear, PERRLA. Moist Oral Mucosa.  5. Supple Neck, No JVD, No cervical lymphadenopathy appriciated, No Carotid Bruits.  6. Symmetrical Chest wall movement, Good air movement bilaterally, CTAB.  7. RRR, No Gallops, Rubs or Murmurs, No Parasternal Heave.  8. Positive Bowel Sounds, Abdomen Soft, No tenderness, No organomegaly appriciated,No rebound -guarding or rigidity. + colostomy  9.  No Cyanosis, Normal Skin Turgor, No Skin Rash or Bruise.  10. Good muscle tone,  joints appear normal , no effusions, Normal ROM.  11. No Palpable Lymph Nodes in Neck or Axillae     Data Review:    CBC  Recent Labs Lab 11/10/2015 0330  WBC 7.9  HGB 14.5  HCT 45.8  PLT 170  MCV 96.0  MCH 30.4  MCHC 31.7  RDW 13.0   ------------------------------------------------------------------------------------------------------------------  Chemistries   Recent Labs Lab 10/26/2015 0330  NA 139  K 3.3*  CL 106  CO2 21*  GLUCOSE 167*  BUN 16  CREATININE 1.70*  CALCIUM 9.9  AST 30  ALT 21  ALKPHOS 53  BILITOT 1.1   ------------------------------------------------------------------------------------------------------------------ CrCl cannot be calculated (Unknown ideal weight.). ------------------------------------------------------------------------------------------------------------------ No results for input(s): TSH, T4TOTAL, T3FREE, THYROIDAB in the last 72 hours.  Invalid input(s): FREET3  Coagulation profile No results for input(s): INR, PROTIME in the last  168 hours. ------------------------------------------------------------------------------------------------------------------- No results for input(s): DDIMER in the last 72 hours. -------------------------------------------------------------------------------------------------------------------  Cardiac Enzymes No results for input(s): CKMB, TROPONINI, MYOGLOBIN in the last 168 hours.  Invalid input(s): CK ------------------------------------------------------------------------------------------------------------------ No results found for: BNP   ---------------------------------------------------------------------------------------------------------------  Urinalysis    Component Value Date/Time   COLORURINE AMBER* 10/21/2015 0336   APPEARANCEUR CLOUDY* 10/28/2015 0336   LABSPEC 1.032* 11/04/2015 0336   PHURINE 5.5 11/12/2015 0336   GLUCOSEU NEGATIVE 11/10/2015 [id number]  HGBUR NEGATIVE 10/14/2015 0336   HGBUR trace-lysed 01/12/2008 1112   BILIRUBINUR SMALL* 10/18/2015 0336   KETONESUR 15* 10/23/2015 0336   PROTEINUR 100* 10/26/2015 0336   UROBILINOGEN 0.2 08/14/2014 0055   NITRITE NEGATIVE 11/08/2015 0336   LEUKOCYTESUR SMALL* 11/12/2015 0336    ----------------------------------------------------------------------------------------------------------------   Imaging Results:    Dg Chest Port 1 View  10/30/2015  CLINICAL DATA:  Recent perforated viscus EXAM: PORTABLE CHEST 1 VIEW COMPARISON:  07/16/2010 FINDINGS: Cardiac shadow is at the upper limits of normal in size. No free air is noted beneath right hemidiaphragm consistent with the given clinical history. Lungs are well aerated bilaterally. No focal infiltrate or sizable effusion is seen. No bony abnormality is noted. IMPRESSION: Free air consistent with the patient's given clinical history. No acute abnormality noted. Electronically Signed   By: Inez Catalina M.D.   On: 10/31/2015 07:33   Ct Renal Stone Study  10/21/2015   CLINICAL DATA:  Abdominal pain around colostomy.  No al put. EXAM: CT ABDOMEN AND PELVIS WITHOUT CONTRAST TECHNIQUE: Multidetector CT imaging of the abdomen and pelvis was performed following the standard protocol without IV contrast. COMPARISON:  08/13/2014 FINDINGS: There is a large volume of extraluminal air throughout the abdomen, pelvis and lower chest. The air most likely originates from a perforation within the peristomal hernia of the right lower quadrant. A large volume of extraluminal air fills the peristomal hernia and then tracks throughout the extraperitoneal spaces of the abdomen and subcutaneous tissues. It also dissects upward around the liver and into a pneumomediastinum. No significant fluid component to the extraluminal collection. In the lower chest, minimal atelectatic opacities in the posterior lung bases. No pneumothorax or effusion. Gallbladder is moderately distended with several calculi. No focal liver lesion. No bile duct dilatation. Pancreas, spleen and left kidney are unremarkable. Right kidney again demonstrates marked atrophy and chronic right hydroureter continuing to the presumed structure in the right hemipelvis. Abdominal aorta is normal in caliber with moderate atherosclerotic calcification. No significant skeletal lesion. Severe lumbar degenerative disc and facet changes are again evident. IMPRESSION: 1. Large volume of extraluminal air in the right abdominal peristomal hernia from perforation of the herniated colon. The extraluminal air has dissected throughout the extraperitoneal spaces of the abdomen and pelvis, also extended across the midline into the subcutaneous anterior soft tissues and dissected upward into a pneumomediastinum of the lower chest. These results were called by telephone at the time of interpretation on 11/02/2015 at 6:48 am to Dr. Tanna Furry , who verbally acknowledged these results. 2. Other findings not significantly changed from 08/13/2014 include  cholelithiasis, chronic right ureteral stricture with profound right renal atrophy, extensive aortoiliac atherosclerotic calcification and severe lumbar degenerative changes. Electronically Signed   By: Andreas Newport M.D.   On: 11/05/2015 06:54       Assessment & Plan:    Active Problems:   Perforated viscus   Pneumomediastinum (HCC)   Tachycardia   Intestinal perforation ([place])    1. Perforated viscous , pt declines surgery NPO Iv abx (levaquin, flagyl due to pcn allergy), appreciate pharmacy dosing Appreciate surgery input  2.  Pneumomediastinum NPO Iv abx Appreciate surgery input  3. ARF Hydrate with ns iv Avoid nephrotoxins  4. Hypokalemia Replete Check cbc, cmp in am  5. Poor prognosis Appreciate palliative care consult  6. Tachycardia Due to pain and possible early sepsis Will continue to monitor  7. Hypertension Cont metoprolol Hold off on amlodipine for now,  Morphine may lower her  bp (using for pain control0  DVT Prophylaxis  SCDs   AM Labs Ordered, also please review Full Orders  Family Communication: Admission, patients condition and plan of care including tests being ordered have been discussed with the patient and family who indicate understanding and agree with the plan and Code Status.  Code Status DNR  Likely DC to  ?  Condition CRITICAL  Consults called: surgery  Admission status: inpatient  Time spent in minutes : 50 minutes   Jani Gravel M.D on 11/01/2015 at 10:33 AM  Between 7am to 7pm - Pager - [phone]. After 7pm go to [email or web address] - password [facility]  [facility] - Office  [phone]

## 2015-10-15 NOTE — Progress Notes (Signed)
   10/16/2015 0800  Clinical Encounter Type  Visited With Family  Visit Type Follow-up;Social support  Referral From Chaplain  Consult/Referral To Chaplain  Spiritual Encounters  Spiritual Needs Emotional  Stress Factors  Patient Stress Factors Health changes  Family Stress Factors Health changes   Chaplain visited with patient's family.  Took over [employer] of presence from [employer].  Walked with family from ED to [facility] and waited until they were able to visit with patient.  Family reminisced about recent beach trip that patient was able to participate in.  Family in positive spirit.  Vilinda Blanks [facility] 11/04/2015 8:56 AM

## 2015-10-15 NOTE — ED Notes (Signed)
Chaplain at bedside

## 2015-10-16 DIAGNOSIS — R1084 Generalized abdominal pain: Secondary | ICD-10-CM | POA: Insufficient documentation

## 2015-10-16 DIAGNOSIS — R69 Illness, unspecified: Secondary | ICD-10-CM

## 2015-10-16 DIAGNOSIS — N170 Acute kidney failure with tubular necrosis: Secondary | ICD-10-CM

## 2015-10-16 DIAGNOSIS — K631 Perforation of intestine (nontraumatic): Secondary | ICD-10-CM

## 2015-10-16 DIAGNOSIS — Z66 Do not resuscitate: Secondary | ICD-10-CM | POA: Insufficient documentation

## 2015-10-16 DIAGNOSIS — Z515 Encounter for palliative care: Secondary | ICD-10-CM | POA: Insufficient documentation

## 2015-10-16 DIAGNOSIS — E876 Hypokalemia: Secondary | ICD-10-CM

## 2015-10-16 LAB — COMPREHENSIVE METABOLIC PANEL
ALBUMIN: 2.3 g/dL — AB (ref 3.5–5.0)
ALT: 14 U/L (ref 14–54)
ANION GAP: 18 — AB (ref 5–15)
AST: 20 U/L (ref 15–41)
Alkaline Phosphatase: 23 U/L — ABNORMAL LOW (ref 38–126)
BILIRUBIN TOTAL: 0.6 mg/dL (ref 0.3–1.2)
BUN: 29 mg/dL — ABNORMAL HIGH (ref 6–20)
CO2: 21 mmol/L — ABNORMAL LOW (ref 22–32)
Calcium: 9.3 mg/dL (ref 8.9–10.3)
Chloride: 105 mmol/L (ref 101–111)
Creatinine, Ser: 2.26 mg/dL — ABNORMAL HIGH (ref 0.44–1.00)
GFR, EST AFRICAN AMERICAN: 20 mL/min — AB (ref >60)
GFR, EST NON AFRICAN AMERICAN: 18 mL/min — AB (ref >60)
Glucose, Bld: 103 mg/dL — ABNORMAL HIGH (ref 65–99)
POTASSIUM: 5.1 mmol/L (ref 3.5–5.1)
Sodium: 144 mmol/L (ref 135–145)
TOTAL PROTEIN: 4.6 g/dL — AB (ref 6.5–8.1)

## 2015-10-16 LAB — CBC
HEMATOCRIT: 39.3 % (ref 36.0–46.0)
HEMOGLOBIN: 12.1 g/dL (ref 12.0–15.0)
MCH: 30.2 pg (ref 26.0–34.0)
MCHC: 30.8 g/dL (ref 30.0–36.0)
MCV: 98 fL (ref 78.0–100.0)
Platelets: 127 10*3/uL — ABNORMAL LOW (ref 150–400)
RBC: 4.01 MIL/uL (ref 3.87–5.11)
RDW: 14 % (ref 11.5–15.5)
WBC: 10.5 10*3/uL (ref 4.0–10.5)

## 2015-10-16 LAB — URINE CULTURE

## 2015-10-16 MED ORDER — CETYLPYRIDINIUM CHLORIDE 0.05 % MT LIQD
7.0000 mL | Freq: Two times a day (BID) | OROMUCOSAL | Status: DC
  Administered 2015-10-16 – 2015-10-22 (×7): 7 mL via OROMUCOSAL

## 2015-10-16 MED ORDER — HALOPERIDOL 1 MG PO TABS: 0.5000 mg | ORAL_TABLET | ORAL | Status: DC | PRN

## 2015-10-16 MED ORDER — HALOPERIDOL LACTATE 2 MG/ML PO CONC: 0.5000 mg | ORAL | Status: DC | PRN

## 2015-10-16 MED ORDER — HALOPERIDOL LACTATE 5 MG/ML IJ SOLN
0.5000 mg | INTRAMUSCULAR | Status: DC | PRN
  Administered 2015-10-16 – 2015-10-18 (×8): 0.5 mg via INTRAVENOUS
  Filled 2015-10-16 (×8): qty 1

## 2015-10-16 MED ORDER — GLYCOPYRROLATE 0.2 MG/ML IJ SOLN: 0.2000 mg | INTRAMUSCULAR | Status: DC | PRN

## 2015-10-16 MED ORDER — ONDANSETRON HCL 4 MG/2ML IJ SOLN: 4.0000 mg | Freq: Four times a day (QID) | INTRAMUSCULAR | Status: DC | PRN

## 2015-10-16 MED ORDER — POLYVINYL ALCOHOL 1.4 % OP SOLN
1.0000 [drp] | Freq: Four times a day (QID) | OPHTHALMIC | Status: DC | PRN
  Filled 2015-10-16: qty 15

## 2015-10-16 MED ORDER — CHLORHEXIDINE GLUCONATE 0.12 % MT SOLN
15.0000 mL | Freq: Two times a day (BID) | OROMUCOSAL | Status: DC
  Administered 2015-10-16 – 2015-10-22 (×12): 15 mL via OROMUCOSAL
  Filled 2015-10-16 (×8): qty 15

## 2015-10-16 MED ORDER — HYDROMORPHONE BOLUS VIA INFUSION
0.5000 mg | INTRAVENOUS | Status: DC | PRN
  Administered 2015-10-16 – 2015-10-17 (×8): 0.5 mg via INTRAVENOUS
  Filled 2015-10-16 (×9): qty 1

## 2015-10-16 MED ORDER — ONDANSETRON 4 MG PO TBDP: 4.0000 mg | ORAL_TABLET | Freq: Four times a day (QID) | ORAL | Status: DC | PRN

## 2015-10-16 MED ORDER — GLYCOPYRROLATE 1 MG PO TABS: 1.0000 mg | ORAL_TABLET | ORAL | Status: DC | PRN

## 2015-10-16 MED ORDER — KETOROLAC TROMETHAMINE 15 MG/ML IJ SOLN
7.5000 mg | Freq: Three times a day (TID) | INTRAMUSCULAR | Status: AC | PRN
  Administered 2015-10-16: 7.5 mg via INTRAVENOUS
  Filled 2015-10-16: qty 1

## 2015-10-16 MED ORDER — BIOTENE DRY MOUTH MT LIQD: 15.0000 mL | OROMUCOSAL | Status: DC | PRN

## 2015-10-16 NOTE — Progress Notes (Signed)
[facility] PROGRESS NOTE  [name] [id number] DOB: Jul 12, 1919 DOA: 11/12/2015   PCP: Nyoka Cowden, MD     Assessment/Plan: Active Problems:   Perforated viscus   Pneumomediastinum ([place])   Tachycardia   Intestinal perforation ([place])   Hypokalemia   Acute renal failure (HCC)   [name] is a 82 y.o. female, w/ hx of colon cancer w colostomy, apparently c/o abdominal pain starting last nite per family. Pt had dry heaves. Pt also noted to have decrease in ostomy output per family. Pt denies fever, chills, constipation, blood per ostomy. Pt brought to ED by family for evaluation.   In Ed. CT scan => perforated viscous along with pneumomediastinum Surgery was consulted by ED and pt and family opted not to pursue surgery due to age and comorbidities. Palliative care has been consulted. Patient and family are aware of the poor prognosis and patient has decided on DNR. And family is amenable to this. Pt will be treated with iv abx, and admitted for perforated viscous and pneumomediastinum  Assessment and plan   1. Perforated viscous , pt declines surgery NPO Iv abx (levaquin, flagyl due to pcn allergy), appreciate pharmacy dosing Continue antibiotics until family comfortable discontinuing them  2. Pneumomediastinum NPO Iv abx Appreciate surgery input, no surgical intervention, comfort care measures  3. ARF Continue IV fluids Avoid nephrotoxins  4. Hypokalemia   5. Poor prognosis Appreciate palliative care consultation requested  6. Tachycardia Due to pain and possible early sepsis Will continue to monitor  7. Hypertension Cont metoprolol Hold off on amlodipine for now,  Morphine may lower her bp (using for pain control0   DVT prophylaxsis SCDs  Code Status:  DO NOT RESUSCITATE        Family Communication: Discussed in detail with the patient, all imaging results, lab results explained to the patient    Disposition Plan:  Continue comfort care measures, anticipate hospital death from sepsis     Consultants:  Palliative care  General surgery    Procedures:  None  Antibiotics: Anti-infectives    Start     Dose/Rate Route Frequency Ordered Stop   10/16/15 0600  levofloxacin (LEVAQUIN) IVPB 750 mg     750 mg 100 mL/hr over 90 Minutes Intravenous Every 48 hours 10/17/2015 1259     11/13/2015 1800  metroNIDAZOLE (FLAGYL) IVPB 500 mg     500 mg 100 mL/hr over 60 Minutes Intravenous Every 8 hours 10/31/2015 1032        HPI/Subjective: Patient appears to be comfortable  Objective: Filed Vitals:   11/04/2015 0915 10/14/2015 1952 10/16/15 0357 10/16/15 1222  BP: 148/86 95/39 107/55   Pulse: 117 85 85   Temp: 98.7 F (37.1 C) 98.9 F (37.2 C) 98.5 F (36.9 C)   TempSrc: Oral Oral Oral   Resp: 20 12 12    Weight:    84.6 kg (186 lb 8.2 oz)  SpO2: 97% 92% 92%     Intake/Output Summary (Last 24 hours) at 10/16/15 1231 Last data filed at 10/16/15 0853  Gross per 24 hour  Intake 2291.7 ml  Output    250 ml  Net 2041.7 ml    Exam:  Examination:  General exam: Appears calm and comfortable , Somnolent Respiratory system: Clear to auscultation. Respiratory effort normal. Cardiovascular system: S1 & S2 heard, RRR. No JVD, murmurs, rubs, gallops or clicks. No pedal edema. Gastrointestinal system: Abdomen is nondistended, soft and nontender. No organomegaly or masses felt. Normal  bowel sounds heard. Central nervous system: Alert and oriented. No focal neurological deficits. Extremities: Symmetric 5 x 5 power. Skin: No rashes, lesions or ulcers     Data Reviewed: I have personally reviewed following labs and imaging studies  Micro Results Recent Results (from the past 240 hour(s))  Urine culture     Status: Abnormal   Collection Time: 10/29/2015  3:36 AM  Result Value Ref Range Status   Specimen Description URINE, RANDOM  Final   Special Requests CX ADDED AT [place] ON [id number]   Final   Culture MULTIPLE SPECIES PRESENT, SUGGEST RECOLLECTION (A)  Final   Report Status 10/16/2015 FINAL  Final    Radiology Reports Dg Chest Port 1 View  11/05/2015  CLINICAL DATA:  Recent perforated viscus EXAM: PORTABLE CHEST 1 VIEW COMPARISON:  07/16/2010 FINDINGS: Cardiac shadow is at the upper limits of normal in size. No free air is noted beneath right hemidiaphragm consistent with the given clinical history. Lungs are well aerated bilaterally. No focal infiltrate or sizable effusion is seen. No bony abnormality is noted. IMPRESSION: Free air consistent with the patient's given clinical history. No acute abnormality noted. Electronically Signed   By: Inez Catalina M.D.   On: 10/31/2015 07:33   Ct Renal Stone Study  10/26/2015  CLINICAL DATA:  Abdominal pain around colostomy.  No al put. EXAM: CT ABDOMEN AND PELVIS WITHOUT CONTRAST TECHNIQUE: Multidetector CT imaging of the abdomen and pelvis was performed following the standard protocol without IV contrast. COMPARISON:  08/13/2014 FINDINGS: There is a large volume of extraluminal air throughout the abdomen, pelvis and lower chest. The air most likely originates from a perforation within the peristomal hernia of the right lower quadrant. A large volume of extraluminal air fills the peristomal hernia and then tracks throughout the extraperitoneal spaces of the abdomen and subcutaneous tissues. It also dissects upward around the liver and into a pneumomediastinum. No significant fluid component to the extraluminal collection. In the lower chest, minimal atelectatic opacities in the posterior lung bases. No pneumothorax or effusion. Gallbladder is moderately distended with several calculi. No focal liver lesion. No bile duct dilatation. Pancreas, spleen and left kidney are unremarkable. Right kidney again demonstrates marked atrophy and chronic right hydroureter continuing to the presumed structure in the right hemipelvis. Abdominal aorta is normal in  caliber with moderate atherosclerotic calcification. No significant skeletal lesion. Severe lumbar degenerative disc and facet changes are again evident. IMPRESSION: 1. Large volume of extraluminal air in the right abdominal peristomal hernia from perforation of the herniated colon. The extraluminal air has dissected throughout the extraperitoneal spaces of the abdomen and pelvis, also extended across the midline into the subcutaneous anterior soft tissues and dissected upward into a pneumomediastinum of the lower chest. These results were called by telephone at the time of interpretation on 11/02/2015 at 6:48 am to Dr. Tanna Furry , who verbally acknowledged these results. 2. Other findings not significantly changed from 08/13/2014 include cholelithiasis, chronic right ureteral stricture with profound right renal atrophy, extensive aortoiliac atherosclerotic calcification and severe lumbar degenerative changes. Electronically Signed   By: Andreas Newport M.D.   On: 10/24/2015 06:54     CBC  Recent Labs Lab 10/25/2015 0330 10/16/15 0422  WBC 7.9 10.5  HGB 14.5 12.1  HCT 45.8 39.3  PLT 170 127*  MCV 96.0 98.0  MCH 30.4 30.2  MCHC 31.7 30.8  RDW 13.0 14.0    Chemistries   Recent Labs Lab 10/18/2015 0330 10/16/15 0422  NA 139  144  K 3.3* 5.1  CL 106 105  CO2 21* 21*  GLUCOSE 167* 103*  BUN 16 29*  CREATININE 1.70* 2.26*  CALCIUM 9.9 9.3  AST 30 20  ALT 21 14  ALKPHOS 53 23*  BILITOT 1.1 0.6   ------------------------------------------------------------------------------------------------------------------ estimated creatinine clearance is 15.7 mL/min (by C-G formula based on Cr of 2.26). ------------------------------------------------------------------------------------------------------------------ No results for input(s): HGBA1C in the last 72 hours. ------------------------------------------------------------------------------------------------------------------ No results for  input(s): CHOL, HDL, LDLCALC, TRIG, CHOLHDL, LDLDIRECT in the last 72 hours. ------------------------------------------------------------------------------------------------------------------ No results for input(s): TSH, T4TOTAL, T3FREE, THYROIDAB in the last 72 hours.  Invalid input(s): FREET3 ------------------------------------------------------------------------------------------------------------------ No results for input(s): VITAMINB12, FOLATE, FERRITIN, TIBC, IRON, RETICCTPCT in the last 72 hours.  Coagulation profile No results for input(s): INR, PROTIME in the last 168 hours.  No results for input(s): DDIMER in the last 72 hours.  Cardiac Enzymes No results for input(s): CKMB, TROPONINI, MYOGLOBIN in the last 168 hours.  Invalid input(s): CK ------------------------------------------------------------------------------------------------------------------ Invalid input(s): POCBNP   CBG: No results for input(s): GLUCAP in the last 168 hours.     Studies: Dg Chest Port 1 View  10/23/2015  CLINICAL DATA:  Recent perforated viscus EXAM: PORTABLE CHEST 1 VIEW COMPARISON:  07/16/2010 FINDINGS: Cardiac shadow is at the upper limits of normal in size. No free air is noted beneath right hemidiaphragm consistent with the given clinical history. Lungs are well aerated bilaterally. No focal infiltrate or sizable effusion is seen. No bony abnormality is noted. IMPRESSION: Free air consistent with the patient's given clinical history. No acute abnormality noted. Electronically Signed   By: Inez Catalina M.D.   On: 11/04/2015 07:33   Ct Renal Stone Study  10/31/2015  CLINICAL DATA:  Abdominal pain around colostomy.  No al put. EXAM: CT ABDOMEN AND PELVIS WITHOUT CONTRAST TECHNIQUE: Multidetector CT imaging of the abdomen and pelvis was performed following the standard protocol without IV contrast. COMPARISON:  08/13/2014 FINDINGS: There is a large volume of extraluminal air throughout the  abdomen, pelvis and lower chest. The air most likely originates from a perforation within the peristomal hernia of the right lower quadrant. A large volume of extraluminal air fills the peristomal hernia and then tracks throughout the extraperitoneal spaces of the abdomen and subcutaneous tissues. It also dissects upward around the liver and into a pneumomediastinum. No significant fluid component to the extraluminal collection. In the lower chest, minimal atelectatic opacities in the posterior lung bases. No pneumothorax or effusion. Gallbladder is moderately distended with several calculi. No focal liver lesion. No bile duct dilatation. Pancreas, spleen and left kidney are unremarkable. Right kidney again demonstrates marked atrophy and chronic right hydroureter continuing to the presumed structure in the right hemipelvis. Abdominal aorta is normal in caliber with moderate atherosclerotic calcification. No significant skeletal lesion. Severe lumbar degenerative disc and facet changes are again evident. IMPRESSION: 1. Large volume of extraluminal air in the right abdominal peristomal hernia from perforation of the herniated colon. The extraluminal air has dissected throughout the extraperitoneal spaces of the abdomen and pelvis, also extended across the midline into the subcutaneous anterior soft tissues and dissected upward into a pneumomediastinum of the lower chest. These results were called by telephone at the time of interpretation on 10/31/2015 at 6:48 am to Dr. Tanna Furry , who verbally acknowledged these results. 2. Other findings not significantly changed from 08/13/2014 include cholelithiasis, chronic right ureteral stricture with profound right renal atrophy, extensive aortoiliac atherosclerotic calcification and severe lumbar degenerative changes. Electronically Signed  By: Andreas Newport M.D.   On: 11/06/2015 06:54      No results found for: HGBA1C Lab Results  Component Value Date   LDLCALC  55 04/20/2010   CREATININE 2.26* 10/16/2015       Scheduled Meds: . antiseptic oral rinse  7 mL Mouth Rinse q12n4p  . chlorhexidine  15 mL Mouth Rinse BID  . levofloxacin (LEVAQUIN) IV  750 mg Intravenous Q48H  . metoprolol  25 mg Oral BID  . metronidazole  500 mg Intravenous Q8H   Continuous Infusions: . HYDROmorphone 0.75 mg/hr (10/16/15 0436)     LOS: 1 day    Time spent: >30 MINS    [facility] Pager [phone]. If 7PM-7AM, please contact night-coverage at [email or web address], password [facility] 10/16/2015, 12:31 PM  LOS: 1 day

## 2015-10-16 NOTE — Progress Notes (Signed)
Nutrition Brief Note  Chart reviewed. Pt now transitioning to comfort care.  No further nutrition interventions warranted at this time.  Please re-consult as needed.   Marieta Markov A. Kamarrion Stfort, RD, LDN, CDE Pager: [phone] After hours Pager: [phone]  

## 2015-10-16 NOTE — Consult Note (Signed)
Consultation Note Date: 10/16/2015   Patient Name: [name]  DOB: 1920-01-29  MRN: [id number]  Age / Sex: 82 y.o., female  PCP: Marletta Lor, MD Referring Physician: Reyne Dumas, MD  Reason for Consultation: Establishing goals of care  HPI/Patient Profile: 82 y.o. female  with past medical history of colostomy placement 16 years ago d/t an iatrogenic colon injury during GYN surgery. She has had a large parastomal hernia for many years requiring 1 admission for blockage of colostomy output.  Admitted on 10/18/2015 with abd pain with nausea and dry heaving following return home from family beach trip. Abd CT showed perforated viscous with pneumomediastinum. Patient and family have declined surgery in favor of comfort.   Clinical Assessment and Goals of Care: I met today with multiple family members at bedside (including 3 of 5 of her daughters). [name] is lethargic and does not participate in conversation. Family is very realistic and understanding of poor prognosis of hours to days. They confirm the goal for comfort. Discussed plan for symptom management as well as signs of progression at end of life. They all agree with d/c IVF as well as antibiotics. Full comfort care. All questions/concerns addressed. Emotional support provided. They are also being supported by pastors of multiple family members for spiritual support.   NEXT OF KIN daughters    SUMMARY OF RECOMMENDATIONS   Full comfort care  Code Status/Advance Care Planning:  DNR   Symptom Management:   Pain/SOB: Continue hydromorphone @ 0.75 mg/hr with 0.5 mg bolus every 15 min prn.   Anxiety: Haldol prn.   Fever: Toradol prn.   Secretions: Robinul prn.   Palliative Prophylaxis:   Delirium Protocol, Frequent Pain Assessment and Oral Care  Additional Recommendations (Limitations, Scope, Preferences):  Full Comfort  Care  Psycho-social/Spiritual:   Desire for further Chaplaincy support:no  Additional Recommendations: Grief/Bereavement Support  Prognosis:   Hours - Days  Discharge Planning: Anticipated Hospital Death      Primary Diagnoses: Present on Admission:  . Perforated viscus . Pneumomediastinum ([place]) . Tachycardia . Intestinal perforation ([place]) . Hypokalemia . Acute renal failure ([place])  I have reviewed the medical record, interviewed the patient and family, and examined the patient. The following aspects are pertinent.  Past Medical History  Diagnosis Date  . CARCINOMA, COLON 02/03/2007  . HYPERLIPIDEMIA 06/19/2007  . Unspecified essential hypertension 02/03/2007  . HYPOTENSION, ORTHOSTATIC 12/24/2007  . PANCREATITIS, ACUTE 12/27/2007  . RENAL FAILURE, ACUTE 12/27/2007  . KIDNEY STONE 02/04/2007  . 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 Lumbago 03/13/2007  . SYNCOPE 03/13/2007  . Dysuria 02/04/2007  . NEPHROLITHIASIS, HX OF 06/19/2007  . LEG PAIN, LEFT 05/14/2010   Social History   Social History  . Marital Status: Married    Spouse Name: N/A  . Number of Children: N/A  . Years of Education: N/A   Social History Main Topics  . Smoking status: Never Smoker   . Smokeless tobacco: Never Used  . Alcohol Use: No  . Drug Use: No  .  Sexual Activity: Not Asked   Other Topics Concern  . None   Social History Narrative   Family History  Problem Relation Age of Onset  . Cirrhosis Mother 52    nonalcholic  . Leukemia Father    Scheduled Meds: . antiseptic oral rinse  7 mL Mouth Rinse q12n4p  . chlorhexidine  15 mL Mouth Rinse BID  . levofloxacin (LEVAQUIN) IV  750 mg Intravenous Q48H  . metoprolol  25 mg Oral BID  . metronidazole  500 mg Intravenous Q8H   Continuous Infusions: . HYDROmorphone 0.75 mg/hr (10/16/15 0436)   PRN Meds:.morphine injection Medications Prior to Admission:  Prior to Admission medications   Medication Sig Start Date End Date Taking?  Authorizing Provider  amLODipine (NORVASC) 5 MG tablet TAKE 1 TABLET BY MOUTH EVERY DAY 09/12/15  Yes Marletta Lor, MD  Flaxseed, Linseed, (FLAXSEED OIL) 1000 MG CAPS Take 2,000 mg by mouth daily.    Yes Historical Provider, MD  metoprolol (LOPRESSOR) 50 MG tablet TAKE 1/2 TABLET BY MOUTH TWICE DAILY 04/20/15  Yes Marletta Lor, MD  Multiple Vitamins-Minerals (ONE-A-DAY WOMENS 50 PLUS PO) Take 1 tablet by mouth daily.   Yes Historical Provider, MD  Omega-3 Fatty Acids (FISH OIL) 1200 MG CAPS Take 1 capsule by mouth daily.    Yes Historical Provider, MD  hydrocortisone cream 1 % Apply topically 2 (two) times daily. Or LOTION, available over the counter. Apply to the rash until heals. Patient not taking: Reported on 10/31/2015 08/18/14   Ripudeep Krystal Eaton, MD  Ostomy Supplies ([place]) [place] 1 each by Does not apply route as needed. 09/06/11   Marletta Lor, MD   Allergies  Allergen Reactions  . Amoxicillin-Pot Clavulanate     REACTION: hives   Review of Systems  Unable to perform ROS: Mental status change    Physical Exam  Constitutional: She appears well-developed. She appears lethargic.  HENT:  Head: Normocephalic and atraumatic.  Cardiovascular: Normal rate.   Pulmonary/Chest: Effort normal. No accessory muscle usage. No tachypnea. No respiratory distress.  Abdominal: She exhibits distension.  Neurological: She appears lethargic.  Nursing note and vitals reviewed.   Vital Signs: BP 107/55 mmHg  Pulse 85  Temp(Src) 98.5 F (36.9 C) (Oral)  Resp 12  Wt 84.6 kg (186 lb 8.2 oz)  SpO2 92% Pain Assessment: PAINAD   Pain Score: Asleep   SpO2: SpO2: 92 % O2 Device:SpO2: 92 % O2 Flow Rate: .   IO: Intake/output summary:  Intake/Output Summary (Last 24 hours) at 10/16/15 1456 Last data filed at 10/16/15 1348  Gross per 24 hour  Intake 2291.7 ml  Output    250 ml  Net 2041.7 ml    LBM:   Baseline Weight: Weight: 84.6 kg (186 lb 8.2  oz) Most recent weight: Weight: 84.6 kg (186 lb 8.2 oz)     Palliative Assessment/Data: PPS: 10%     Time In: 1350 Time Out: 1440 Time Total: 86mn Greater than 50%  of this time was spent counseling and coordinating care related to the above assessment and plan.  Signed by: PPershing Proud NP   Please contact Palliative Medicine Team phone at [phone] questions and concerns.  For individual provider: See AShea Evans

## 2015-10-16 NOTE — Progress Notes (Signed)
Patient ID: [name], female   DOB: 10-08-1919, 82 y.o.   MRN: [id number]  Family in room Patient appears comfortable  Plan is to proceed with comfort care and no aggressive surgical management

## 2015-10-16 NOTE — Care Management Important Message (Signed)
Important Message  Patient Details  Name: [name] MRN: [id number] Date of Birth: 23-Jan-1920   Medicare Important Message Given:  Yes    Qamar Rosman [place] 10/16/2015, 12:02 PM

## 2015-10-17 DIAGNOSIS — Z66 Do not resuscitate: Secondary | ICD-10-CM

## 2015-10-17 DIAGNOSIS — Z515 Encounter for palliative care: Secondary | ICD-10-CM | POA: Insufficient documentation

## 2015-10-17 MED ORDER — LORAZEPAM 2 MG/ML IJ SOLN: 0.5000 mg | INTRAMUSCULAR | Status: DC | PRN

## 2015-10-17 MED ORDER — HYDROMORPHONE BOLUS VIA INFUSION
1.0000 mg | INTRAVENOUS | Status: DC | PRN
  Administered 2015-10-17 – 2015-10-22 (×8): 1 mg via INTRAVENOUS
  Filled 2015-10-17 (×9): qty 1

## 2015-10-17 NOTE — Progress Notes (Signed)
[facility] PROGRESS NOTE  [name] [id number] DOB: 08/27/1919 DOA: 10/27/2015   PCP: Nyoka Cowden, MD     Assessment/Plan: Active Problems:   Perforated viscus   Pneumomediastinum ([place])   Tachycardia   Intestinal perforation ([place])   Hypokalemia   Acute renal failure ([place])   Palliative care encounter   DNR (do not resuscitate)   Generalized abdominal pain   [name] is a 82 y.o. female, w/ hx of colon cancer w colostomy, apparently c/o abdominal pain starting last nite per family. Pt had dry heaves. Pt also noted to have decrease in ostomy output per family. Pt denies fever, chills, constipation, blood per ostomy. Pt brought to ED by family for evaluation.   In Ed. CT scan => perforated viscous along with pneumomediastinum Surgery was consulted by ED and pt and family opted not to pursue surgery due to age and comorbidities. Palliative care has been consulted. Patient and family are aware of the poor prognosis and patient has decided on DNR. And family is amenable to this. Pt will be treated with iv abx, and admitted for perforated viscous and pneumomediastinum  Assessment and plan   1. Perforated viscous , pt declined surgery NPO Initially started on (levaquin, flagyl due to pcn allergy), discontinued secondary to full comfort care measures And  now on Dilaudid drip for comfort  2. Pneumomediastinum NPO Iv abx Appreciate surgery input, no surgical intervention, comfort care measures   3. ARF IV fluids stopped Avoid nephrotoxins  4. Hypokalemia , no further labs   5. Poor prognosis Appreciate palliative care consultation requested  6. Tachycardia Due to pain and possible early sepsis Will continue to monitor  7. Hypertension Discontinued metoprolol Hold off on amlodipine for now,  Started on Dilaudid drip for comfort   DVT prophylaxsis SCDs  Code Status:  DO NOT RESUSCITATE        Family  Communication: Discussed in detail with the patient's family in the room, all imaging results, lab results explained to the patient   Disposition Plan:  Continue comfort care measures, anticipate hospital death from sepsis     Consultants:  Palliative care  General surgery    Procedures:  None  Antibiotics: Anti-infectives    Start     Dose/Rate Route Frequency Ordered Stop   10/16/15 0600  levofloxacin (LEVAQUIN) IVPB 750 mg     750 mg 100 mL/hr over 90 Minutes Intravenous Every 48 hours 10/21/2015 1259     10/21/2015 1800  metroNIDAZOLE (FLAGYL) IVPB 500 mg     500 mg 100 mL/hr over 60 Minutes Intravenous Every 8 hours 11/12/2015 1032        HPI/Subjective: Patient appears to be comfortable, Family by the bedside  Objective: Filed Vitals:   11/11/2015 1952 10/16/15 0357 10/16/15 1222 10/17/15 0542  BP: 95/39 107/55  169/71  Pulse: 85 85  128  Temp: 98.9 F (37.2 C) 98.5 F (36.9 C)  98.1 F (36.7 C)  TempSrc: Oral Oral  Axillary  Resp: 12 12  16   Weight:   84.6 kg (186 lb 8.2 oz)   SpO2: 92% 92%  92%    Intake/Output Summary (Last 24 hours) at 10/17/15 1015 Last data filed at 10/17/15 [id number]  Gross per 24 hour  Intake 156.83 ml  Output    425 ml  Net -268.17 ml    Exam:  Examination:  General exam: Appears calm and comfortable , Somnolent Respiratory system: Clear to auscultation. Respiratory effort  normal. Cardiovascular system: S1 & S2 heard, RRR. No JVD, murmurs, rubs, gallops or clicks. No pedal edema. Gastrointestinal system: Abdomen is nondistended, soft and nontender. No organomegaly or masses felt. Normal bowel sounds heard. Central nervous system: Alert and oriented. No focal neurological deficits. Extremities: Symmetric 5 x 5 power. Skin: No rashes, lesions or ulcers     Data Reviewed: I have personally reviewed following labs and imaging studies  Micro Results Recent Results (from the past 240 hour(s))  Urine culture     Status: Abnormal    Collection Time: 11/11/2015  3:36 AM  Result Value Ref Range Status   Specimen Description URINE, RANDOM  Final   Special Requests CX ADDED AT [place] ON [id number]  Final   Culture MULTIPLE SPECIES PRESENT, SUGGEST RECOLLECTION (A)  Final   Report Status 10/16/2015 FINAL  Final    Radiology Reports Dg Chest Port 1 View  10/31/2015  CLINICAL DATA:  Recent perforated viscus EXAM: PORTABLE CHEST 1 VIEW COMPARISON:  07/16/2010 FINDINGS: Cardiac shadow is at the upper limits of normal in size. No free air is noted beneath right hemidiaphragm consistent with the given clinical history. Lungs are well aerated bilaterally. No focal infiltrate or sizable effusion is seen. No bony abnormality is noted. IMPRESSION: Free air consistent with the patient's given clinical history. No acute abnormality noted. Electronically Signed   By: Inez Catalina M.D.   On: 10/28/2015 07:33   Ct Renal Stone Study  11/08/2015  CLINICAL DATA:  Abdominal pain around colostomy.  No al put. EXAM: CT ABDOMEN AND PELVIS WITHOUT CONTRAST TECHNIQUE: Multidetector CT imaging of the abdomen and pelvis was performed following the standard protocol without IV contrast. COMPARISON:  08/13/2014 FINDINGS: There is a large volume of extraluminal air throughout the abdomen, pelvis and lower chest. The air most likely originates from a perforation within the peristomal hernia of the right lower quadrant. A large volume of extraluminal air fills the peristomal hernia and then tracks throughout the extraperitoneal spaces of the abdomen and subcutaneous tissues. It also dissects upward around the liver and into a pneumomediastinum. No significant fluid component to the extraluminal collection. In the lower chest, minimal atelectatic opacities in the posterior lung bases. No pneumothorax or effusion. Gallbladder is moderately distended with several calculi. No focal liver lesion. No bile duct dilatation. Pancreas, spleen and left kidney are unremarkable. Right  kidney again demonstrates marked atrophy and chronic right hydroureter continuing to the presumed structure in the right hemipelvis. Abdominal aorta is normal in caliber with moderate atherosclerotic calcification. No significant skeletal lesion. Severe lumbar degenerative disc and facet changes are again evident. IMPRESSION: 1. Large volume of extraluminal air in the right abdominal peristomal hernia from perforation of the herniated colon. The extraluminal air has dissected throughout the extraperitoneal spaces of the abdomen and pelvis, also extended across the midline into the subcutaneous anterior soft tissues and dissected upward into a pneumomediastinum of the lower chest. These results were called by telephone at the time of interpretation on 10/20/2015 at 6:48 am to Dr. Tanna Furry , who verbally acknowledged these results. 2. Other findings not significantly changed from 08/13/2014 include cholelithiasis, chronic right ureteral stricture with profound right renal atrophy, extensive aortoiliac atherosclerotic calcification and severe lumbar degenerative changes. Electronically Signed   By: Andreas Newport M.D.   On: 11/03/2015 06:54     CBC  Recent Labs Lab 11/09/2015 0330 10/16/15 0422  WBC 7.9 10.5  HGB 14.5 12.1  HCT 45.8 39.3  PLT 170 127*  MCV 96.0 98.0  MCH 30.4 30.2  MCHC 31.7 30.8  RDW 13.0 14.0    Chemistries   Recent Labs Lab 10/21/2015 0330 10/16/15 0422  NA 139 144  K 3.3* 5.1  CL 106 105  CO2 21* 21*  GLUCOSE 167* 103*  BUN 16 29*  CREATININE 1.70* 2.26*  CALCIUM 9.9 9.3  AST 30 20  ALT 21 14  ALKPHOS 53 23*  BILITOT 1.1 0.6   ------------------------------------------------------------------------------------------------------------------ estimated creatinine clearance is 15.7 mL/min (by C-G formula based on Cr of 2.26). ------------------------------------------------------------------------------------------------------------------ No results for input(s):  HGBA1C in the last 72 hours. ------------------------------------------------------------------------------------------------------------------ No results for input(s): CHOL, HDL, LDLCALC, TRIG, CHOLHDL, LDLDIRECT in the last 72 hours. ------------------------------------------------------------------------------------------------------------------ No results for input(s): TSH, T4TOTAL, T3FREE, THYROIDAB in the last 72 hours.  Invalid input(s): FREET3 ------------------------------------------------------------------------------------------------------------------ No results for input(s): VITAMINB12, FOLATE, FERRITIN, TIBC, IRON, RETICCTPCT in the last 72 hours.  Coagulation profile No results for input(s): INR, PROTIME in the last 168 hours.  No results for input(s): DDIMER in the last 72 hours.  Cardiac Enzymes No results for input(s): CKMB, TROPONINI, MYOGLOBIN in the last 168 hours.  Invalid input(s): CK ------------------------------------------------------------------------------------------------------------------ Invalid input(s): POCBNP   CBG: No results for input(s): GLUCAP in the last 168 hours.     Studies: No results found.    No results found for: HGBA1C Lab Results  Component Value Date   LDLCALC 55 04/20/2010   CREATININE 2.26* 10/16/2015       Scheduled Meds: . antiseptic oral rinse  7 mL Mouth Rinse q12n4p  . chlorhexidine  15 mL Mouth Rinse BID   Continuous Infusions: . HYDROmorphone 1 mg/hr (10/17/15 0800)     LOS: 2 days    Time spent: >30 MINS    [facility]  [facility] Pager [phone]. If 7PM-7AM, please contact night-coverage at [email or web address], password [facility] 10/17/2015, 10:15 AM  LOS: 2 days

## 2015-10-17 NOTE — Progress Notes (Signed)
Wasted 12.5 of dilaudid drip. [place] witnessed waste in sink

## 2015-10-17 NOTE — Progress Notes (Signed)
Daily Progress Note   Patient Name: [name]       Date: 10/17/2015 DOB: [birth year]  Age: 82 y.o. MRN#: [id number] Attending Physician: Reyne Dumas, MD Primary Care Physician: Nyoka Cowden, MD Admit Date: 10/24/2015  Reason for Consultation/Follow-up: Psychosocial/spiritual support and Terminal Care  Subjective: Patient sleeping soundly.  Multiple family members at bedside.  I answered questions.   Family is pleased with care.  Length of Stay: 2  Current Medications: Scheduled Meds:  . antiseptic oral rinse  7 mL Mouth Rinse q12n4p  . chlorhexidine  15 mL Mouth Rinse BID    Continuous Infusions: . HYDROmorphone 1 mg/hr (10/17/15 1059)    PRN Meds: antiseptic oral rinse, [DISCONTINUED] glycopyrrolate **OR** [DISCONTINUED] glycopyrrolate **OR** glycopyrrolate, [DISCONTINUED] haloperidol **OR** [DISCONTINUED] haloperidol **OR** haloperidol lactate, HYDROmorphone, ketorolac, LORazepam, [DISCONTINUED] ondansetron **OR** ondansetron (ZOFRAN) IV, polyvinyl alcohol  Physical Exam     Elderly female, Wd, sleeping soundly would not wake to my exam. CV rrr Resp NAd Extremities:  2+      Vital Signs: BP 169/71 mmHg  Pulse 128  Temp(Src) 98.1 F (36.7 C) (Axillary)  Resp 16  Wt 84.6 kg (186 lb 8.2 oz)  SpO2 92% SpO2: SpO2: 92 % O2 Device: O2 Device: Nasal Cannula O2 Flow Rate: O2 Flow Rate (L/min): 2 L/min  Intake/output summary:   Intake/Output Summary (Last 24 hours) at 10/17/15 1110 Last data filed at 10/17/15 [id number]  Gross per 24 hour  Intake 156.83 ml  Output    425 ml  Net -268.17 ml   LBM:   Baseline Weight: Weight: 84.6 kg (186 lb 8.2 oz) Most recent weight: Weight: 84.6 kg (186 lb 8.2 oz)       Palliative Assessment/Data:    Flowsheet Rows          Most Recent Value   Intake Tab    Clinical Assessment    Palliative Performance Scale Score  10%   Psychosocial & Spiritual Assessment    Palliative Care Outcomes    Patient/Family meeting held?  Yes   Who was at the meeting?  daughter, family      Patient Active Problem List   Diagnosis Date Noted  . Palliative care encounter   . DNR (do not resuscitate)   . Generalized abdominal pain   . Perforated viscus 11/10/2015  .  Pneumomediastinum ([place]) 10/17/2015  . Tachycardia 11/05/2015  . Intestinal perforation ([place]) 10/28/2015  . Hypokalemia 10/21/2015  . Acute renal failure ([place]) 10/30/2015  . SBO (small bowel obstruction) ([place]) 08/14/2014  . Nausea with vomiting 08/14/2014  . Abdominal distention 08/14/2014  . Ventral hernia with bowel obstruction 08/14/2014  . AP (abdominal pain)   . CKD (chronic kidney disease) stage 3, GFR 30-59 ml/min 08/05/2014  . LEG PAIN, LEFT 05/14/2010  . PANCREATITIS, ACUTE 12/27/2007  . RENAL FAILURE, ACUTE 12/27/2007  . DIARRHEA, ACUTE 12/27/2007  . HYPOTENSION, ORTHOSTATIC 12/24/2007  . Dyslipidemia 06/19/2007  . NEPHROLITHIASIS, HX OF 06/19/2007  . Lumbago 03/13/2007  . SYNCOPE 03/13/2007  . KIDNEY STONE 02/04/2007  . DYSURIA 02/04/2007  . Malignant neoplasm of colon ([place]) 02/03/2007  . Essential hypertension 02/03/2007  . STENOSIS, LUMBAR SPINE 02/03/2007    Palliative Care Assessment & Plan   Patient Profile: 82 y.o. female with past medical history of colostomy placement 16 years ago.  She has had a large parastomal hernia for many years requiring 1 admission for blockage of colostomy output. Admitted on 11/03/2015 with abd pain with nausea and dry heaving following return home from family beach trip. Abd CT showed perforated viscous with pneumomediastinum. Patient and family have declined surgery in favor of comfort  Assessment: Patient actively dying.  Appears very comfortable.  Recommendations/Plan:  Continue current care  with dilaudid infusion and PRN pushes.  Greatly appreciate RN work!  Goals of Care and Additional Recommendations:  Limitations on Scope of Treatment: Full Comfort Care  Code Status: DNR  Prognosis:   Hours - Days  Discharge Planning:  Anticipated Hospital Death  Care plan was discussed with family at bedside.  Thank you for allowing the Palliative Medicine Team to assist in the care of this patient.   Time In: 9:00 Time Out: 9:15 Total Time 15 Prolonged Time Billed no      Greater than 50%  of this time was spent counseling and coordinating care related to the above assessment and plan.  Imogene Burn, PA-C Palliative Medicine Pager: [phone]  Please contact Palliative Medicine Team phone at [phone] for questions and concerns.

## 2015-10-17 NOTE — Progress Notes (Signed)
Palliative:  I spoke with Alyse Low, RN regarding Ms. Blaszczyk as I see she has required frequent bolus of dilaudid overnight. RN says patient has had a few boluses and remains uncomfortable and moaning. Will increase dilaudid basal rate to 1 mg/hr and increase bolus to 1 mg every 15 min prn. Palliative provider will follow up on management of symptoms. Also added lorazepam prn if no relief from increased dilaudid.   No Charge  Vinie Sill, NP Palliative Medicine Team Pager # [phone] (M-F 8a-5p) Team Phone # [phone] (Nights/Weekends)

## 2015-10-18 DIAGNOSIS — R1084 Generalized abdominal pain: Secondary | ICD-10-CM

## 2015-10-18 MED ORDER — WHITE PETROLATUM GEL
Status: AC
  Administered 2015-10-18: 11:00:00
  Filled 2015-10-18: qty 1

## 2015-10-18 MED ORDER — LORAZEPAM 2 MG/ML IJ SOLN
0.5000 mg | INTRAMUSCULAR | Status: DC | PRN
  Filled 2015-10-18: qty 1

## 2015-10-18 MED ORDER — LORAZEPAM 2 MG/ML IJ SOLN
0.5000 mg | INTRAMUSCULAR | Status: DC
Start: 2015-10-18 — End: 2015-10-22
  Administered 2015-10-18 – 2015-10-22 (×23): 0.5 mg via INTRAVENOUS
  Filled 2015-10-18 (×22): qty 1

## 2015-10-18 NOTE — Progress Notes (Signed)
Daily Progress Note   Patient Name: [name]       Date: 10/18/2015 DOB: 10/08/1919  Age: 82 y.o. MRN#: [id number] Attending Physician: Reyne Dumas, MD Primary Care Physician: Nyoka Cowden, MD Admit Date: 10/16/2015  Reason for Consultation/Follow-up: Psychosocial/spiritual support and Terminal Care  Subjective: Ms. Mccrackin appears comfortable. Dilaudid infusion appears to be maintaining comfort well. She is requiring occasional bolus but not frequently. Family at bedside says that she will become restless occasionally - discussed terminal delirium. She is having very irregular breathing and episodes of apnea. She is progressing and could pass at anytime - family aware. Emotional support provided.   Length of Stay: 3  Current Medications: Scheduled Meds:  . antiseptic oral rinse  7 mL Mouth Rinse q12n4p  . chlorhexidine  15 mL Mouth Rinse BID  . white petrolatum        Continuous Infusions: . HYDROmorphone 1 mg/hr (10/17/15 1059)    PRN Meds: antiseptic oral rinse, [DISCONTINUED] glycopyrrolate **OR** [DISCONTINUED] glycopyrrolate **OR** glycopyrrolate, [DISCONTINUED] haloperidol **OR** [DISCONTINUED] haloperidol **OR** haloperidol lactate, HYDROmorphone, ketorolac, LORazepam, [DISCONTINUED] ondansetron **OR** ondansetron (ZOFRAN) IV, polyvinyl alcohol  Physical Exam  Constitutional: She appears well-developed.  HENT:  Head: Normocephalic and atraumatic.  Cardiovascular: Tachycardia present.   Pulmonary/Chest: No accessory muscle usage. No tachypnea. No respiratory distress.  Irregular with episodes of apnea  Abdominal: She exhibits distension.  Colostomy   Neurological:  Opens eyes occasionally but not to command and does not track  Nursing note and vitals  reviewed.      Elderly female, Wd, sleeping soundly would not wake to my exam. CV rrr Resp NAd Extremities:  2+      Vital Signs: BP 189/91 mmHg  Pulse 120  Temp(Src) 98.6 F (37 C) (Oral)  Resp 20  Wt 84.6 kg (186 lb 8.2 oz)  SpO2 92% SpO2: SpO2: 92 % O2 Device: O2 Device: Not Delivered O2 Flow Rate: O2 Flow Rate (L/min): 2 L/min  Intake/output summary:   Intake/Output Summary (Last 24 hours) at 10/18/15 1119 Last data filed at 10/18/15 0600  Gross per 24 hour  Intake 121.62 ml  Output    850 ml  Net -728.38 ml   LBM:   Baseline Weight: Weight: 84.6 kg (186 lb 8.2 oz) Most recent weight: Weight: 84.6 kg (186 lb 8.2 oz)  Palliative Assessment/Data: PPS: 10%   Flowsheet Rows        Most Recent Value   Intake Tab    Clinical Assessment    Palliative Performance Scale Score  10%   Psychosocial & Spiritual Assessment    Palliative Care Outcomes    Patient/Family meeting held?  Yes   Who was at the meeting?  daughter, family      Patient Active Problem List   Diagnosis Date Noted  . Terminal care   . Palliative care encounter   . DNR (do not resuscitate)   . Generalized abdominal pain   . Perforated viscus 10/23/2015  . Pneumomediastinum ([place]) 10/24/2015  . Tachycardia 11/09/2015  . Intestinal perforation ([place]) 10/24/2015  . Hypokalemia 10/24/2015  . Acute renal failure ([place]) 11/08/2015  . SBO (small bowel obstruction) ([place]) 08/14/2014  . Nausea with vomiting 08/14/2014  . Abdominal distention 08/14/2014  . Ventral hernia with bowel obstruction 08/14/2014  . AP (abdominal pain)   . CKD (chronic kidney disease) stage 3, GFR 30-59 ml/min 08/05/2014  . LEG PAIN, LEFT 05/14/2010  . PANCREATITIS, ACUTE 12/27/2007  . RENAL FAILURE, ACUTE 12/27/2007  . DIARRHEA, ACUTE 12/27/2007  . HYPOTENSION, ORTHOSTATIC 12/24/2007  . Dyslipidemia 06/19/2007  . NEPHROLITHIASIS, HX OF 06/19/2007  . Lumbago 03/13/2007  . SYNCOPE 03/13/2007  . KIDNEY STONE 02/04/2007   . DYSURIA 02/04/2007  . Malignant neoplasm of colon ([place]) 02/03/2007  . Essential hypertension 02/03/2007  . STENOSIS, LUMBAR SPINE 02/03/2007    Palliative Care Assessment & Plan   Patient Profile: 82 y.o. female with past medical history of colostomy placement 16 years ago.  She has had a large parastomal hernia for many years requiring 1 admission for blockage of colostomy output. Admitted on 11/05/2015 with abd pain with nausea and dry heaving following return home from family beach trip. Abd CT showed perforated viscous with pneumomediastinum. Patient and family have declined surgery in favor of comfort.   Assessment: Patient actively dying.  Appears very comfortable.  Recommendations/Plan:  Pain/dyspnea: Continue current basal and bolus rate of dilaudid.   Haldol prn agitation.   Goals of Care and Additional Recommendations:  Limitations on Scope of Treatment: Full Comfort Care  Code Status: DNR  Prognosis:   Hours - Days  Discharge Planning:  Anticipated Hospital Death  Care plan was discussed with family at bedside.  Thank you for allowing the Palliative Medicine Team to assist in the care of this patient.   Time In: 0915 Time Out: 0935 Total Time 92min Prolonged Time Billed no      Greater than 50%  of this time was spent counseling and coordinating care related to the above assessment and plan.  Imogene Burn, PA-C Palliative Medicine Pager: [phone]  Please contact Palliative Medicine Team phone at [phone] for questions and concerns.

## 2015-10-18 NOTE — Progress Notes (Signed)
[facility] PROGRESS NOTE  MORRISSA [name] [id number] DOB: 09/13/1919 DOA: 10/27/2015   PCP: Nyoka Cowden, MD     Assessment/Plan: Active Problems:   Perforated viscus   Pneumomediastinum ([place])   Tachycardia   Intestinal perforation ([place])   Hypokalemia   Acute renal failure ([place])   Palliative care encounter   DNR (do not resuscitate)   Generalized abdominal pain   Terminal care   [name] is a 82 y.o. female, w/ hx of colon cancer w colostomy, apparently c/o abdominal pain starting last nite per family. Pt had dry heaves. Pt also noted to have decrease in ostomy output per family. Pt denies fever, chills, constipation, blood per ostomy. Pt brought to ED by family for evaluation.   In Ed. CT scan => perforated viscous along with pneumomediastinum Surgery was consulted by ED and pt and family opted not to pursue surgery due to age and comorbidities. Palliative care has been consulted. Patient and family are aware of the poor prognosis and patient has decided on DNR. And family is amenable to this. Pt will be treated with iv abx, and admitted for perforated viscous and pneumomediastinum  Assessment and plan   1. Perforated viscous , pt declined surgery NPO Initially started on (levaquin, flagyl due to pcn allergy), discontinued secondary to full comfort care measures Continue Robinul, Dilaudid, lorazepam  2. Pneumomediastinum NPO Iv abx Appreciate surgery input, no surgical intervention, comfort care measures   3. ARF IV fluids stopped Avoid nephrotoxins  4. Hypokalemia , no further labs   5. Poor prognosis Appreciate palliative care consultation requested  6. Tachycardia Due to pain and possible early sepsis Will continue to monitor  7. Hypertension Discontinued metoprolol Hold off on amlodipine for now,  Started on Dilaudid drip for comfort   DVT prophylaxsis SCDs  Code Status:  DO NOT RESUSCITATE         Family Communication: Discussed in detail with the patient's family in the room, all imaging results, lab results explained to the patient   Disposition Plan:  Continue comfort care measures, anticipate hospital death from sepsis     Consultants:  Palliative care  General surgery    Procedures:  None  Antibiotics: Anti-infectives    Start     Dose/Rate Route Frequency Ordered Stop   10/16/15 0600  levofloxacin (LEVAQUIN) IVPB 750 mg     750 mg 100 mL/hr over 90 Minutes Intravenous Every 48 hours 10/28/2015 1259     10/21/2015 1800  metroNIDAZOLE (FLAGYL) IVPB 500 mg     500 mg 100 mL/hr over 60 Minutes Intravenous Every 8 hours 11/09/2015 1032        HPI/Subjective: Patient appears to be comfortable, Family by the bedside  Objective: Filed Vitals:   10/16/15 0357 10/16/15 1222 10/17/15 0542 10/18/15 0429  BP: 107/55  169/71 189/91  Pulse: 85  128 120  Temp: 98.5 F (36.9 C)  98.1 F (36.7 C) 98.6 F (37 C)  TempSrc: Oral  Axillary Oral  Resp: 12  16 20   Weight:  84.6 kg (186 lb 8.2 oz)    SpO2: 92%  92% 92%    Intake/Output Summary (Last 24 hours) at 10/18/15 1118 Last data filed at 10/18/15 0600  Gross per 24 hour  Intake 121.62 ml  Output    850 ml  Net -728.38 ml    Exam:  Examination:  General exam: Appears calm and comfortable , Somnolent Respiratory system: Clear to auscultation. Respiratory effort  normal. Cardiovascular system: S1 & S2 heard, RRR. No JVD, murmurs, rubs, gallops or clicks. No pedal edema. Gastrointestinal system: Abdomen is nondistended, soft and nontender. No organomegaly or masses felt. Normal bowel sounds heard. Central nervous system: Alert and oriented. No focal neurological deficits. Extremities: Symmetric 5 x 5 power. Skin: No rashes, lesions or ulcers     Data Reviewed: I have personally reviewed following labs and imaging studies  Micro Results Recent Results (from the past 240 hour(s))  Urine culture      Status: Abnormal   Collection Time: 11/11/2015  3:36 AM  Result Value Ref Range Status   Specimen Description URINE, RANDOM  Final   Special Requests CX ADDED AT [facility] [id number]  Final   Culture MULTIPLE SPECIES PRESENT, SUGGEST RECOLLECTION (A)  Final   Report Status 10/16/2015 FINAL  Final    Radiology Reports Dg Chest Port 1 View  10/17/2015  CLINICAL DATA:  Recent perforated viscus EXAM: PORTABLE CHEST 1 VIEW COMPARISON:  07/16/2010 FINDINGS: Cardiac shadow is at the upper limits of normal in size. No free air is noted beneath right hemidiaphragm consistent with the given clinical history. Lungs are well aerated bilaterally. No focal infiltrate or sizable effusion is seen. No bony abnormality is noted. IMPRESSION: Free air consistent with the patient's given clinical history. No acute abnormality noted. Electronically Signed   By: Inez Catalina M.D.   On: 11/01/2015 07:33   Ct Renal Stone Study  11/11/2015  CLINICAL DATA:  Abdominal pain around colostomy.  No al put. EXAM: CT ABDOMEN AND PELVIS WITHOUT CONTRAST TECHNIQUE: Multidetector CT imaging of the abdomen and pelvis was performed following the standard protocol without IV contrast. COMPARISON:  08/13/2014 FINDINGS: There is a large volume of extraluminal air throughout the abdomen, pelvis and lower chest. The air most likely originates from a perforation within the peristomal hernia of the right lower quadrant. A large volume of extraluminal air fills the peristomal hernia and then tracks throughout the extraperitoneal spaces of the abdomen and subcutaneous tissues. It also dissects upward around the liver and into a pneumomediastinum. No significant fluid component to the extraluminal collection. In the lower chest, minimal atelectatic opacities in the posterior lung bases. No pneumothorax or effusion. Gallbladder is moderately distended with several calculi. No focal liver lesion. No bile duct dilatation. Pancreas, spleen and left kidney are  unremarkable. Right kidney again demonstrates marked atrophy and chronic right hydroureter continuing to the presumed structure in the right hemipelvis. Abdominal aorta is normal in caliber with moderate atherosclerotic calcification. No significant skeletal lesion. Severe lumbar degenerative disc and facet changes are again evident. IMPRESSION: 1. Large volume of extraluminal air in the right abdominal peristomal hernia from perforation of the herniated colon. The extraluminal air has dissected throughout the extraperitoneal spaces of the abdomen and pelvis, also extended across the midline into the subcutaneous anterior soft tissues and dissected upward into a pneumomediastinum of the lower chest. These results were called by telephone at the time of interpretation on 11/10/2015 at 6:48 am to Dr. Tanna Furry , who verbally acknowledged these results. 2. Other findings not significantly changed from 08/13/2014 include cholelithiasis, chronic right ureteral stricture with profound right renal atrophy, extensive aortoiliac atherosclerotic calcification and severe lumbar degenerative changes. Electronically Signed   By: Andreas Newport M.D.   On: 10/21/2015 06:54     CBC  Recent Labs Lab 11/03/2015 0330 10/16/15 0422  WBC 7.9 10.5  HGB 14.5 12.1  HCT 45.8 39.3  PLT 170 127*  MCV 96.0 98.0  MCH 30.4 30.2  MCHC 31.7 30.8  RDW 13.0 14.0    Chemistries   Recent Labs Lab 11/04/2015 0330 10/16/15 0422  NA 139 144  K 3.3* 5.1  CL 106 105  CO2 21* 21*  GLUCOSE 167* 103*  BUN 16 29*  CREATININE 1.70* 2.26*  CALCIUM 9.9 9.3  AST 30 20  ALT 21 14  ALKPHOS 53 23*  BILITOT 1.1 0.6   ------------------------------------------------------------------------------------------------------------------ estimated creatinine clearance is 15.7 mL/min (by C-G formula based on Cr of 2.26). ------------------------------------------------------------------------------------------------------------------ No  results for input(s): HGBA1C in the last 72 hours. ------------------------------------------------------------------------------------------------------------------ No results for input(s): CHOL, HDL, LDLCALC, TRIG, CHOLHDL, LDLDIRECT in the last 72 hours. ------------------------------------------------------------------------------------------------------------------ No results for input(s): TSH, T4TOTAL, T3FREE, THYROIDAB in the last 72 hours.  Invalid input(s): FREET3 ------------------------------------------------------------------------------------------------------------------ No results for input(s): VITAMINB12, FOLATE, FERRITIN, TIBC, IRON, RETICCTPCT in the last 72 hours.  Coagulation profile No results for input(s): INR, PROTIME in the last 168 hours.  No results for input(s): DDIMER in the last 72 hours.  Cardiac Enzymes No results for input(s): CKMB, TROPONINI, MYOGLOBIN in the last 168 hours.  Invalid input(s): CK ------------------------------------------------------------------------------------------------------------------ Invalid input(s): POCBNP   CBG: No results for input(s): GLUCAP in the last 168 hours.     Studies: No results found.    No results found for: HGBA1C Lab Results  Component Value Date   LDLCALC 55 04/20/2010   CREATININE 2.26* 10/16/2015       Scheduled Meds: . antiseptic oral rinse  7 mL Mouth Rinse q12n4p  . chlorhexidine  15 mL Mouth Rinse BID  . white petrolatum       Continuous Infusions: . HYDROmorphone 1 mg/hr (10/17/15 1059)     LOS: 3 days    Time spent: >30 MINS    [facility]  [facility] Pager [phone]. If 7PM-7AM, please contact night-coverage at [email or web address], password [facility] 10/18/2015, 11:18 AM  LOS: 3 days

## 2015-10-18 NOTE — Progress Notes (Signed)
Palliative:  Called to the bedside by [facility], RN as family has questions about an ativan infusion. I spoke with family who is concerned she is more restless today and noticed she had received more haldol yesterday. Although RN does not believe that haldol was particularly effective. Will add scheduled Ativan to help with any anxiety after speaking with family. May increase dose of scheduled Ativan if continued to be restless. Also need to make sure not pain related and could increase dilaudid if needed to achieve comfort if more distressed. RN to call hospitalist coverage overnight for any further changes in symptom management to achieve comfort.   Vinie Sill, NP Palliative Medicine Team Pager # [phone] (M-F 8a-5p) Team Phone # [phone] (Nights/Weekends)

## 2015-10-18 NOTE — Progress Notes (Signed)
Patient ID: [name], female   DOB: Jan 10, 1920, 82 y.o.   MRN: [id number]   Given comfort care, will go ahead and sign off.

## 2015-10-19 NOTE — Progress Notes (Signed)
Daily Progress Note   Patient Name: [name]       Date: 10/19/2015 DOB: 1919-09-20  Age: 82 y.o. MRN#: [id number] Attending Physician: Reyne Dumas, MD Primary Care Physician: Nyoka Cowden, MD Admit Date: 11/07/2015  Reason for Consultation/Follow-up: Psychosocial/spiritual support and Terminal Care  Subjective: Ms. Brushaber is still progressing. Breathing very shallow and apnea. Her jaw is more relaxed. She appears comfortable. Family at bedside. All questions/concerns addressed. Emotional support provided. No changes in symptom management.   Length of Stay: 4  Current Medications: Scheduled Meds:  . antiseptic oral rinse  7 mL Mouth Rinse q12n4p  . chlorhexidine  15 mL Mouth Rinse BID  . LORazepam  0.5 mg Intravenous Q4H    Continuous Infusions: . HYDROmorphone 1 mg/hr (10/19/15 0724)    PRN Meds: antiseptic oral rinse, [DISCONTINUED] glycopyrrolate **OR** [DISCONTINUED] glycopyrrolate **OR** glycopyrrolate, [DISCONTINUED] haloperidol **OR** [DISCONTINUED] haloperidol **OR** haloperidol lactate, HYDROmorphone, ketorolac, LORazepam, [DISCONTINUED] ondansetron **OR** ondansetron (ZOFRAN) IV, polyvinyl alcohol  Physical Exam  Constitutional: She appears well-developed.  HENT:  Head: Normocephalic and atraumatic.  Cardiovascular: Tachycardia present.   Pulmonary/Chest: No accessory muscle usage. No tachypnea. No respiratory distress.  Irregular with episodes of apnea, very shallow  Abdominal: She exhibits distension.  Colostomy   Neurological:  Opens eyes occasionally but not to command and does not track  Nursing note and vitals reviewed.      Elderly female, Wd, sleeping soundly would not wake to my exam. CV rrr Resp NAd Extremities:  2+      Vital  Signs: BP 156/82 mmHg  Pulse 106  Temp(Src) 98.9 F (37.2 C) (Oral)  Resp 12  Wt 84.6 kg (186 lb 8.2 oz)  SpO2 76% SpO2: SpO2: (!) 76 % O2 Device: O2 Device: Not Delivered O2 Flow Rate: O2 Flow Rate (L/min): 2 L/min  Intake/output summary:   Intake/Output Summary (Last 24 hours) at 10/19/15 1004 Last data filed at 10/19/15 0836  Gross per 24 hour  Intake     20 ml  Output    925 ml  Net   -905 ml   LBM:   Baseline Weight: Weight: 84.6 kg (186 lb 8.2 oz) Most recent weight: Weight: 84.6 kg (186 lb 8.2 oz)       Palliative Assessment/Data: PPS: 10%   Flowsheet Rows  Most Recent Value   Intake Tab    Referral Department  -- [ED]   Unit at Time of Referral  ER   Palliative Care Primary Diagnosis  Cancer   Date Notified  10/16/2015   Palliative Care Type  New Palliative care   Reason for referral  Clarify Goals of Care   Date of Admission  11/02/2015   Date first seen by Palliative Care  10/16/15   # of days Palliative referral response time  1 Day(s)   # of days IP prior to Palliative referral  0   Clinical Assessment    Palliative Performance Scale Score  10%   Psychosocial & Spiritual Assessment    Palliative Care Outcomes    Patient/Family meeting held?  Yes   Who was at the meeting?  daughter, family      Patient Active Problem List   Diagnosis Date Noted  . Terminal care   . Palliative care encounter   . DNR (do not resuscitate)   . Generalized abdominal pain   . Perforated viscus 10/19/2015  . Pneumomediastinum ([place]) 10/23/2015  . Tachycardia 11/07/2015  . Intestinal perforation ([place]) 11/04/2015  . Hypokalemia 11/06/2015  . Acute renal failure ([place]) 10/27/2015  . SBO (small bowel obstruction) ([place]) 08/14/2014  . Nausea with vomiting 08/14/2014  . Abdominal distention 08/14/2014  . Ventral hernia with bowel obstruction 08/14/2014  . AP (abdominal pain)   . CKD (chronic kidney disease) stage 3, GFR 30-59 ml/min 08/05/2014  . LEG PAIN, LEFT  05/14/2010  . PANCREATITIS, ACUTE 12/27/2007  . RENAL FAILURE, ACUTE 12/27/2007  . DIARRHEA, ACUTE 12/27/2007  . HYPOTENSION, ORTHOSTATIC 12/24/2007  . Dyslipidemia 06/19/2007  . NEPHROLITHIASIS, HX OF 06/19/2007  . Lumbago 03/13/2007  . SYNCOPE 03/13/2007  . KIDNEY STONE 02/04/2007  . DYSURIA 02/04/2007  . Malignant neoplasm of colon ([place]) 02/03/2007  . Essential hypertension 02/03/2007  . STENOSIS, LUMBAR SPINE 02/03/2007    Palliative Care Assessment & Plan   Patient Profile: 82 y.o. female with past medical history of colostomy placement 16 years ago.  She has had a large parastomal hernia for many years requiring 1 admission for blockage of colostomy output. Admitted on 11/08/2015 with abd pain with nausea and dry heaving following return home from family beach trip. Abd CT showed perforated viscous with pneumomediastinum. Patient and family have declined surgery in favor of comfort.   Assessment: Patient actively dying.  Appears very comfortable.  Recommendations/Plan:  Pain/dyspnea: Continue current basal and bolus rate of dilaudid.   Haldol prn agitation.   Goals of Care and Additional Recommendations:  Limitations on Scope of Treatment: Full Comfort Care  Code Status: DNR  Prognosis:   Hours - Days  Discharge Planning:  Anticipated Hospital Death  Care plan was discussed with family at bedside.  Thank you for allowing the Palliative Medicine Team to assist in the care of this patient.   Time In: 0850 Time Out: 0910 Total Time 46min Prolonged Time Billed no      Greater than 50%  of this time was spent counseling and coordinating care related to the above assessment and plan.  Vinie Sill, NP Palliative Medicine Team Pager # [phone] (M-F 8a-5p) Team Phone # [phone] (Nights/Weekends)  Please contact Palliative Medicine Team phone at [phone] for questions and concerns.

## 2015-10-19 NOTE — Progress Notes (Signed)
[facility] PROGRESS NOTE  [name] [id number] DOB: 03-Dec-1919 DOA: 11/06/2015   PCP: Nyoka Cowden, MD     Assessment/Plan: Active Problems:   Perforated viscus   Pneumomediastinum ([place])   Tachycardia   Intestinal perforation ([place])   Hypokalemia   Acute renal failure ([place])   Palliative care encounter   DNR (do not resuscitate)   Generalized abdominal pain   Terminal care   [name] is a 82 y.o. female, w/ hx of colon cancer w colostomy, apparently c/o abdominal pain starting last nite per family. Pt had dry heaves. Pt also noted to have decrease in ostomy output per family. Pt denies fever, chills, constipation, blood per ostomy. Pt brought to ED by family for evaluation.   In Ed. CT scan => perforated viscous along with pneumomediastinum Surgery was consulted by ED and pt and family opted not to pursue surgery due to age and comorbidities. Palliative care has been consulted. Patient and family are aware of the poor prognosis and patient has decided on DNR. And family is amenable to this. Pt will be treated with iv abx, and admitted for perforated viscous and pneumomediastinum  Assessment and plan   1. Perforated viscous , pt declined surgery Initially started on (levaquin, flagyl due to pcn allergy), discontinued secondary to full comfort care measures Continue Robinul, Dilaudid, lorazepam, dosage is being titrated by palliative care Found to have agonal respirations but vitals are stable   2. Pneumomediastinum Iv abx discontinued Surgery has signed off   3. ARF IV fluids stopped Avoid nephrotoxins, no further labs  4. Hypokalemia , no further labs   5. Poor prognosis Appreciate palliative care consultation requested  6. Tachycardia Due to pain and possible early sepsis Will continue to monitor  7. Hypertension Discontinued metoprolol Hold off on amlodipine for now,  Started on Dilaudid drip for  comfort 7/4   DVT prophylaxsis SCDs  Code Status:  DO NOT RESUSCITATE        Family Communication: Discussed in detail with the patient's family in the room, all imaging results, lab results explained to the patient   Disposition Plan:  Continue comfort care measures, anticipate hospital death from sepsis     Consultants:  Palliative care  General surgery    Procedures:  None     HPI/Subjective: Blood pressure still holding up, patient sleeping comfortably  Objective: Filed Vitals:   10/17/15 0542 10/18/15 0429 10/18/15 1145 10/19/15 0609  BP: 169/71 189/91 132/72 156/82  Pulse: 128 120 97 106  Temp: 98.1 F (36.7 C) 98.6 F (37 C) 98.4 F (36.9 C) 98.9 F (37.2 C)  TempSrc: Axillary Oral Axillary Oral  Resp: 16 20 14 12   Weight:      SpO2: 92% 92% 83% 76%    Intake/Output Summary (Last 24 hours) at 10/19/15 1204 Last data filed at 10/19/15 0836  Gross per 24 hour  Intake     12 ml  Output    925 ml  Net   -913 ml    Exam:  Examination:  General exam: Appears calm and comfortable , Somnolent Respiratory system: Clear to auscultation. Respiratory effort normal. Cardiovascular system: S1 & S2 heard, RRR. No JVD, murmurs, rubs, gallops or clicks. No pedal edema. Gastrointestinal system: Abdomen is nondistended, soft and nontender. No organomegaly or masses felt. Normal bowel sounds heard. Central nervous system: Alert and oriented. No focal neurological deficits. Extremities: Symmetric 5 x 5 power. Skin: No rashes, lesions or ulcers  Data Reviewed: I have personally reviewed following labs and imaging studies  Micro Results Recent Results (from the past 240 hour(s))  Urine culture     Status: Abnormal   Collection Time: 10/23/2015  3:36 AM  Result Value Ref Range Status   Specimen Description URINE, RANDOM  Final   Special Requests CX ADDED AT [place] ON [id number]  Final   Culture MULTIPLE SPECIES PRESENT, SUGGEST RECOLLECTION (A)  Final    Report Status 10/16/2015 FINAL  Final    Radiology Reports Dg Chest Port 1 View  10/18/2015  CLINICAL DATA:  Recent perforated viscus EXAM: PORTABLE CHEST 1 VIEW COMPARISON:  07/16/2010 FINDINGS: Cardiac shadow is at the upper limits of normal in size. No free air is noted beneath right hemidiaphragm consistent with the given clinical history. Lungs are well aerated bilaterally. No focal infiltrate or sizable effusion is seen. No bony abnormality is noted. IMPRESSION: Free air consistent with the patient's given clinical history. No acute abnormality noted. Electronically Signed   By: Inez Catalina M.D.   On: 11/06/2015 07:33   Ct Renal Stone Study  10/27/2015  CLINICAL DATA:  Abdominal pain around colostomy.  No al put. EXAM: CT ABDOMEN AND PELVIS WITHOUT CONTRAST TECHNIQUE: Multidetector CT imaging of the abdomen and pelvis was performed following the standard protocol without IV contrast. COMPARISON:  08/13/2014 FINDINGS: There is a large volume of extraluminal air throughout the abdomen, pelvis and lower chest. The air most likely originates from a perforation within the peristomal hernia of the right lower quadrant. A large volume of extraluminal air fills the peristomal hernia and then tracks throughout the extraperitoneal spaces of the abdomen and subcutaneous tissues. It also dissects upward around the liver and into a pneumomediastinum. No significant fluid component to the extraluminal collection. In the lower chest, minimal atelectatic opacities in the posterior lung bases. No pneumothorax or effusion. Gallbladder is moderately distended with several calculi. No focal liver lesion. No bile duct dilatation. Pancreas, spleen and left kidney are unremarkable. Right kidney again demonstrates marked atrophy and chronic right hydroureter continuing to the presumed structure in the right hemipelvis. Abdominal aorta is normal in caliber with moderate atherosclerotic calcification. No significant skeletal  lesion. Severe lumbar degenerative disc and facet changes are again evident. IMPRESSION: 1. Large volume of extraluminal air in the right abdominal peristomal hernia from perforation of the herniated colon. The extraluminal air has dissected throughout the extraperitoneal spaces of the abdomen and pelvis, also extended across the midline into the subcutaneous anterior soft tissues and dissected upward into a pneumomediastinum of the lower chest. These results were called by telephone at the time of interpretation on 11/10/2015 at 6:48 am to Dr. Tanna Furry , who verbally acknowledged these results. 2. Other findings not significantly changed from 08/13/2014 include cholelithiasis, chronic right ureteral stricture with profound right renal atrophy, extensive aortoiliac atherosclerotic calcification and severe lumbar degenerative changes. Electronically Signed   By: Andreas Newport M.D.   On: 11/12/2015 06:54     CBC  Recent Labs Lab 10/24/2015 0330 10/16/15 0422  WBC 7.9 10.5  HGB 14.5 12.1  HCT 45.8 39.3  PLT 170 127*  MCV 96.0 98.0  MCH 30.4 30.2  MCHC 31.7 30.8  RDW 13.0 14.0    Chemistries   Recent Labs Lab 11/05/2015 0330 10/16/15 0422  NA 139 144  K 3.3* 5.1  CL 106 105  CO2 21* 21*  GLUCOSE 167* 103*  BUN 16 29*  CREATININE 1.70* 2.26*  CALCIUM 9.9 9.3  AST 30 20  ALT 21 14  ALKPHOS 53 23*  BILITOT 1.1 0.6   ------------------------------------------------------------------------------------------------------------------ estimated creatinine clearance is 15.7 mL/min (by C-G formula based on Cr of 2.26). ------------------------------------------------------------------------------------------------------------------ No results for input(s): HGBA1C in the last 72 hours. ------------------------------------------------------------------------------------------------------------------ No results for input(s): CHOL, HDL, LDLCALC, TRIG, CHOLHDL, LDLDIRECT in the last 72  hours. ------------------------------------------------------------------------------------------------------------------ No results for input(s): TSH, T4TOTAL, T3FREE, THYROIDAB in the last 72 hours.  Invalid input(s): FREET3 ------------------------------------------------------------------------------------------------------------------ No results for input(s): VITAMINB12, FOLATE, FERRITIN, TIBC, IRON, RETICCTPCT in the last 72 hours.  Coagulation profile No results for input(s): INR, PROTIME in the last 168 hours.  No results for input(s): DDIMER in the last 72 hours.  Cardiac Enzymes No results for input(s): CKMB, TROPONINI, MYOGLOBIN in the last 168 hours.  Invalid input(s): CK ------------------------------------------------------------------------------------------------------------------ Invalid input(s): POCBNP   CBG: No results for input(s): GLUCAP in the last 168 hours.     Studies: No results found.    No results found for: HGBA1C Lab Results  Component Value Date   LDLCALC 55 04/20/2010   CREATININE 2.26* 10/16/2015       Scheduled Meds: . antiseptic oral rinse  7 mL Mouth Rinse q12n4p  . chlorhexidine  15 mL Mouth Rinse BID  . LORazepam  0.5 mg Intravenous Q4H   Continuous Infusions: . HYDROmorphone 1 mg/hr (10/19/15 0724)     LOS: 4 days    Time spent: >30 MINS    [facility]  [facility] Pager [phone]. If 7PM-7AM, please contact night-coverage at [email or web address], password [facility] 10/19/2015, 12:04 PM  LOS: 4 days

## 2015-10-19 NOTE — Care Management Important Message (Signed)
Important Message  Patient Details  Name: [name] MRN: [id number] Date of Birth: 1920-01-14   Medicare Important Message Given:  Yes    [name] 10/19/2015, 9:07 AM

## 2015-10-20 NOTE — Progress Notes (Signed)
Palliative Medicine RN Note: reviewed medication doses and use of prn doses. Pt is relaxed, pale, breathing open-mouthed. Four daughters in the room; they are pleased with pt's perceived comfort level. PAINAD = 0. Providced number for weekend palliative contact. Plan f/u by PMT prn.  Marjie Skiff Larnie Heart, RN, BSN, [facility] 10/20/2015 11:49 AM Cell [phone] 8:00-4:00 Monday-Friday Office [phone]

## 2015-10-20 NOTE — Progress Notes (Signed)
Pt unmoved nor washed per family request. They do not want her to be turned as they say she does not like it

## 2015-10-20 NOTE — Progress Notes (Signed)
Pt on palliative, flat affect, agonal breathing given schedule ativan.

## 2015-10-20 NOTE — Progress Notes (Signed)
[facility] PROGRESS NOTE  [name] [id number] DOB: [birth year] DOA: 10/14/2015   PCP: Nyoka Cowden, MD     Assessment/Plan: Active Problems:   Perforated viscus   Pneumomediastinum ([place])   Tachycardia   Intestinal perforation ([place])   Hypokalemia   Acute renal failure ([place])   Palliative care encounter   DNR (do not resuscitate)   Generalized abdominal pain   Terminal care   [name] is a 82 y.o. female, w/ hx of colon cancer w colostomy, apparently c/o abdominal pain starting last nite per family. Pt had dry heaves. Pt also noted to have decrease in ostomy output per family. Pt denies fever, chills, constipation, blood per ostomy. Pt brought to ED by family for evaluation.   In Ed. CT scan => perforated viscous along with pneumomediastinum Surgery was consulted by ED and pt and family opted not to pursue surgery due to age and comorbidities. Palliative care has been consulted. Patient and family are aware of the poor prognosis and patient has decided on DNR. And family is amenable to this. Pt will be treated with iv abx, and admitted for perforated viscous and pneumomediastinum  Assessment and plan   1. Perforated viscous , pt declined surgery Initially started on (levaquin, flagyl due to pcn allergy), discontinued secondary to full comfort care measures Continue Robinul, Dilaudid, lorazepam, dosage is being titrated by palliative care Found to have agonal respirations but vitals are stable   2. Pneumomediastinum Iv abx discontinued Surgery has signed off   3. ARF IV fluids stopped Avoid nephrotoxins, no further labs  4. Hypokalemia , no further labs   5. Poor prognosis Appreciate palliative care consultation requested  6. Tachycardia Due to pain and possible early sepsis Will continue to monitor  7. Hypertension Discontinued metoprolol Hold off on amlodipine for now,  Started on Dilaudid drip for  comfort 7/4   DVT prophylaxsis SCDs  Code Status:  DO NOT RESUSCITATE        Family Communication: Discussed in detail with the patient's family in the room, all imaging results, lab results explained to the patient   Disposition Plan:  Continue comfort care measures, anticipate hospital death from sepsis     Consultants:  Palliative care  General surgery    Procedures:  None     HPI/Subjective: Blood pressure still holding up, patient sleeping comfortably  Objective: Filed Vitals:   10/18/15 0429 10/18/15 1145 10/19/15 0609 10/20/15 0610  BP: 189/91 132/72 156/82 162/82  Pulse: 120 97 106 114  Temp: 98.6 F (37 C) 98.4 F (36.9 C) 98.9 F (37.2 C) 99.8 F (37.7 C)  TempSrc: Oral Axillary Oral Oral  Resp: 20 14 12    Weight:      SpO2: 92% 83% 76% 89%    Intake/Output Summary (Last 24 hours) at 10/20/15 1332 Last data filed at 10/20/15 0400  Gross per 24 hour  Intake  39.99 ml  Output    350 ml  Net -310.01 ml    Exam:  Examination:  General exam: Appears calm and comfortable , Somnolent Respiratory system: Clear to auscultation. Respiratory effort normal. Cardiovascular system: S1 & S2 heard, RRR. No JVD, murmurs, rubs, gallops or clicks. No pedal edema. Gastrointestinal system: Abdomen is nondistended, soft and nontender. No organomegaly or masses felt. Normal bowel sounds heard. Central nervous system: Alert and oriented. No focal neurological deficits. Extremities: Symmetric 5 x 5 power. Skin: No rashes, lesions or ulcers     Data  Reviewed: I have personally reviewed following labs and imaging studies  Micro Results Recent Results (from the past 240 hour(s))  Urine culture     Status: Abnormal   Collection Time: 11/10/2015  3:36 AM  Result Value Ref Range Status   Specimen Description URINE, RANDOM  Final   Special Requests CX ADDED AT [place] ON [id number]  Final   Culture MULTIPLE SPECIES PRESENT, SUGGEST RECOLLECTION (A)  Final   Report  Status 10/16/2015 FINAL  Final    Radiology Reports Dg Chest Port 1 View  10/21/2015  CLINICAL DATA:  Recent perforated viscus EXAM: PORTABLE CHEST 1 VIEW COMPARISON:  07/16/2010 FINDINGS: Cardiac shadow is at the upper limits of normal in size. No free air is noted beneath right hemidiaphragm consistent with the given clinical history. Lungs are well aerated bilaterally. No focal infiltrate or sizable effusion is seen. No bony abnormality is noted. IMPRESSION: Free air consistent with the patient's given clinical history. No acute abnormality noted. Electronically Signed   By: Inez Catalina M.D.   On: 10/25/2015 07:33   Ct Renal Stone Study  10/23/2015  CLINICAL DATA:  Abdominal pain around colostomy.  No al put. EXAM: CT ABDOMEN AND PELVIS WITHOUT CONTRAST TECHNIQUE: Multidetector CT imaging of the abdomen and pelvis was performed following the standard protocol without IV contrast. COMPARISON:  08/13/2014 FINDINGS: There is a large volume of extraluminal air throughout the abdomen, pelvis and lower chest. The air most likely originates from a perforation within the peristomal hernia of the right lower quadrant. A large volume of extraluminal air fills the peristomal hernia and then tracks throughout the extraperitoneal spaces of the abdomen and subcutaneous tissues. It also dissects upward around the liver and into a pneumomediastinum. No significant fluid component to the extraluminal collection. In the lower chest, minimal atelectatic opacities in the posterior lung bases. No pneumothorax or effusion. Gallbladder is moderately distended with several calculi. No focal liver lesion. No bile duct dilatation. Pancreas, spleen and left kidney are unremarkable. Right kidney again demonstrates marked atrophy and chronic right hydroureter continuing to the presumed structure in the right hemipelvis. Abdominal aorta is normal in caliber with moderate atherosclerotic calcification. No significant skeletal lesion.  Severe lumbar degenerative disc and facet changes are again evident. IMPRESSION: 1. Large volume of extraluminal air in the right abdominal peristomal hernia from perforation of the herniated colon. The extraluminal air has dissected throughout the extraperitoneal spaces of the abdomen and pelvis, also extended across the midline into the subcutaneous anterior soft tissues and dissected upward into a pneumomediastinum of the lower chest. These results were called by telephone at the time of interpretation on 10/29/2015 at 6:48 am to Dr. Tanna Furry , who verbally acknowledged these results. 2. Other findings not significantly changed from 08/13/2014 include cholelithiasis, chronic right ureteral stricture with profound right renal atrophy, extensive aortoiliac atherosclerotic calcification and severe lumbar degenerative changes. Electronically Signed   By: Andreas Newport M.D.   On: 11/04/2015 06:54     CBC  Recent Labs Lab 11/08/2015 0330 10/16/15 0422  WBC 7.9 10.5  HGB 14.5 12.1  HCT 45.8 39.3  PLT 170 127*  MCV 96.0 98.0  MCH 30.4 30.2  MCHC 31.7 30.8  RDW 13.0 14.0    Chemistries   Recent Labs Lab 11/09/2015 0330 10/16/15 0422  NA 139 144  K 3.3* 5.1  CL 106 105  CO2 21* 21*  GLUCOSE 167* 103*  BUN 16 29*  CREATININE 1.70* 2.26*  CALCIUM 9.9 9.3  AST 30 20  ALT 21 14  ALKPHOS 53 23*  BILITOT 1.1 0.6   ------------------------------------------------------------------------------------------------------------------ estimated creatinine clearance is 15.7 mL/min (by C-G formula based on Cr of 2.26). ------------------------------------------------------------------------------------------------------------------ No results for input(s): HGBA1C in the last 72 hours. ------------------------------------------------------------------------------------------------------------------ No results for input(s): CHOL, HDL, LDLCALC, TRIG, CHOLHDL, LDLDIRECT in the last 72  hours. ------------------------------------------------------------------------------------------------------------------ No results for input(s): TSH, T4TOTAL, T3FREE, THYROIDAB in the last 72 hours.  Invalid input(s): FREET3 ------------------------------------------------------------------------------------------------------------------ No results for input(s): VITAMINB12, FOLATE, FERRITIN, TIBC, IRON, RETICCTPCT in the last 72 hours.  Coagulation profile No results for input(s): INR, PROTIME in the last 168 hours.  No results for input(s): DDIMER in the last 72 hours.  Cardiac Enzymes No results for input(s): CKMB, TROPONINI, MYOGLOBIN in the last 168 hours.  Invalid input(s): CK ------------------------------------------------------------------------------------------------------------------ Invalid input(s): POCBNP   CBG: No results for input(s): GLUCAP in the last 168 hours.     Studies: No results found.    No results found for: HGBA1C Lab Results  Component Value Date   LDLCALC 55 04/20/2010   CREATININE 2.26* 10/16/2015       Scheduled Meds: . antiseptic oral rinse  7 mL Mouth Rinse q12n4p  . chlorhexidine  15 mL Mouth Rinse BID  . LORazepam  0.5 mg Intravenous Q4H   Continuous Infusions: . HYDROmorphone 1 mg/hr (10/19/15 0724)     LOS: 5 days    Time spent: >30 MINS    [facility] Pager [phone]. If 7PM-7AM, please contact night-coverage at [email or web address], password [facility] 10/20/2015, 1:32 PM  LOS: 5 days

## 2015-10-20 NOTE — Progress Notes (Signed)
Call Daughter Mardene Celeste when patient passes (Home: [phone] - Cell phone: [phone]). Family/patient would like [facility] - [place], [place], [place] [address]. Family also stated that they do not plan on seeing patient at bedside after she dies and that they want the funeral home to home pick up patient in [place].

## 2015-10-20 NOTE — Progress Notes (Signed)
   10/20/15 0801  Adult Fall Risk Assessment  Risk Factor Category (scoring not indicated) Complete paralysis, or completely immobilized (document Low fall risk)  Age 82  Fall History: Fall within 6 months prior to admission 0  Elimination; Bowel and/or Urine Incontinence 0  Elimination; Bowel and/or Urine Urgency/Frequency 0  Medications: includes PCA/Opiates, Anti-convulsants, Anti-hypertensives, Diuretics, Hypnotics, Laxatives, Sedatives, and Psychotropics 3  Patient Care Equipment 1  Mobility-Assistance 0  Mobility-Gait 2  Mobility-Sensory Deficit 2  Altered awareness of immediate physical environment 1  Impulsiveness 0  Lack of understanding of one's physical/cognitive limitations 4  Total Score 16  Patient's Fall Risk Low Fall Risk (0-5 points)  fall risk assessment tool not reflective of actual pt's risk status. Pt unable to move by self. Charted as a low fall risk despite high score

## 2015-10-21 MED ORDER — ACETAMINOPHEN 650 MG RE SUPP
650.0000 mg | RECTAL | Status: DC | PRN
  Administered 2015-10-21: 650 mg via RECTAL
  Filled 2015-10-21: qty 1

## 2015-10-21 NOTE — Progress Notes (Signed)
Wasted Dilaudid(0.5mg /ml) 47ml to sink. Witnessed by Lavell Anchors

## 2015-10-21 NOTE — Progress Notes (Signed)
[facility] PROGRESS NOTE  [name] [id number] DOB: 03-26-1920 DOA: 10/31/2015   PCP: Nyoka Cowden, MD     Assessment/Plan: Active Problems:   Perforated viscus   Pneumomediastinum ([place])   Tachycardia   Intestinal perforation ([place])   Hypokalemia   Acute renal failure ([place])   Palliative care encounter   DNR (do not resuscitate)   Generalized abdominal pain   Terminal care   [name] is a 82 y.o. female, w/ hx of colon cancer w colostomy, apparently c/o abdominal pain starting last nite per family. Pt had dry heaves. Pt also noted to have decrease in ostomy output per family. Pt denies fever, chills, constipation, blood per ostomy. Pt brought to ED by family for evaluation.   In Ed. CT scan => perforated viscous along with pneumomediastinum Surgery was consulted by ED and pt and family opted not to pursue surgery due to age and comorbidities. Palliative care has been consulted. Patient and family are aware of the poor prognosis and patient has decided on DNR. And family is amenable to this. Pt will be treated with iv abx, and admitted for perforated viscous and pneumomediastinum  Assessment and plan   1. Perforated viscous , pt declined surgery Initially started on (levaquin, flagyl due to pcn allergy), discontinued secondary to full comfort care measures Continue Robinul, Dilaudid, lorazepam, dosage is being titrated by palliative care Found to have agonal respirations but vitals are becoming unstable febrile and tachycardic    2. Pneumomediastinum Iv abx discontinued Surgery has signed off   3. ARF IV fluids stopped Avoid nephrotoxins, no further labs  4. Hypokalemia , no further labs   5. Poor prognosis Appreciate palliative care consultation requested  6. Tachycardia Due to pain and possible early sepsis Will continue to monitor  7. Hypertension Discontinued metoprolol Hold off on amlodipine for now,   Started on Dilaudid drip for comfort 7/4   DVT prophylaxsis SCDs  Code Status:  DO NOT RESUSCITATE        Family Communication: Discussed in detail with the patient's family in the room, all imaging results, lab results explained to the patient   Disposition Plan:  Continue comfort care measures, anticipate hospital death from sepsis     Consultants:  Palliative care  General surgery    Procedures:  None     HPI/Subjective: Patient febrile, tachycardic this morning  Objective: Filed Vitals:   10/18/15 1145 10/19/15 0609 10/20/15 0610 10/21/15 0543  BP: 132/72 156/82 162/82 133/86  Pulse: 97 106 114 116  Temp: 98.4 F (36.9 C) 98.9 F (37.2 C) 99.8 F (37.7 C) 101.1 F (38.4 C)  TempSrc: Axillary Oral Oral Axillary  Resp: 14 12    Weight:      SpO2: 83% 76% 89% 77%    Intake/Output Summary (Last 24 hours) at 10/21/15 1231 Last data filed at 10/21/15 0158  Gross per 24 hour  Intake      0 ml  Output    100 ml  Net   -100 ml    Exam:  Examination:  General exam: Appears calm and comfortable , Somnolent Respiratory system: Clear to auscultation. Respiratory effort normal. Cardiovascular system: S1 & S2 heard, RRR. No JVD, murmurs, rubs, gallops or clicks. No pedal edema. Gastrointestinal system: Abdomen is nondistended, soft and nontender. No organomegaly or masses felt. Normal bowel sounds heard. Central nervous system: Alert and oriented. No focal neurological deficits. Extremities: Symmetric 5 x 5 power. Skin: No rashes,  lesions or ulcers     Data Reviewed: I have personally reviewed following labs and imaging studies  Micro Results Recent Results (from the past 240 hour(s))  Urine culture     Status: Abnormal   Collection Time: 10/21/2015  3:36 AM  Result Value Ref Range Status   Specimen Description URINE, RANDOM  Final   Special Requests CX ADDED AT [place] ON [id number]  Final   Culture MULTIPLE SPECIES PRESENT, SUGGEST RECOLLECTION (A)   Final   Report Status 10/16/2015 FINAL  Final    Radiology Reports Dg Chest Port 1 View  11/06/2015  CLINICAL DATA:  Recent perforated viscus EXAM: PORTABLE CHEST 1 VIEW COMPARISON:  07/16/2010 FINDINGS: Cardiac shadow is at the upper limits of normal in size. No free air is noted beneath right hemidiaphragm consistent with the given clinical history. Lungs are well aerated bilaterally. No focal infiltrate or sizable effusion is seen. No bony abnormality is noted. IMPRESSION: Free air consistent with the patient's given clinical history. No acute abnormality noted. Electronically Signed   By: Inez Catalina M.D.   On: 10/19/2015 07:33   Ct Renal Stone Study  11/03/2015  CLINICAL DATA:  Abdominal pain around colostomy.  No al put. EXAM: CT ABDOMEN AND PELVIS WITHOUT CONTRAST TECHNIQUE: Multidetector CT imaging of the abdomen and pelvis was performed following the standard protocol without IV contrast. COMPARISON:  08/13/2014 FINDINGS: There is a large volume of extraluminal air throughout the abdomen, pelvis and lower chest. The air most likely originates from a perforation within the peristomal hernia of the right lower quadrant. A large volume of extraluminal air fills the peristomal hernia and then tracks throughout the extraperitoneal spaces of the abdomen and subcutaneous tissues. It also dissects upward around the liver and into a pneumomediastinum. No significant fluid component to the extraluminal collection. In the lower chest, minimal atelectatic opacities in the posterior lung bases. No pneumothorax or effusion. Gallbladder is moderately distended with several calculi. No focal liver lesion. No bile duct dilatation. Pancreas, spleen and left kidney are unremarkable. Right kidney again demonstrates marked atrophy and chronic right hydroureter continuing to the presumed structure in the right hemipelvis. Abdominal aorta is normal in caliber with moderate atherosclerotic calcification. No significant  skeletal lesion. Severe lumbar degenerative disc and facet changes are again evident. IMPRESSION: 1. Large volume of extraluminal air in the right abdominal peristomal hernia from perforation of the herniated colon. The extraluminal air has dissected throughout the extraperitoneal spaces of the abdomen and pelvis, also extended across the midline into the subcutaneous anterior soft tissues and dissected upward into a pneumomediastinum of the lower chest. These results were called by telephone at the time of interpretation on 10/23/2015 at 6:48 am to Dr. Tanna Furry , who verbally acknowledged these results. 2. Other findings not significantly changed from 08/13/2014 include cholelithiasis, chronic right ureteral stricture with profound right renal atrophy, extensive aortoiliac atherosclerotic calcification and severe lumbar degenerative changes. Electronically Signed   By: Andreas Newport M.D.   On: 11/06/2015 06:54     CBC  Recent Labs Lab 11/11/2015 0330 10/16/15 0422  WBC 7.9 10.5  HGB 14.5 12.1  HCT 45.8 39.3  PLT 170 127*  MCV 96.0 98.0  MCH 30.4 30.2  MCHC 31.7 30.8  RDW 13.0 14.0    Chemistries   Recent Labs Lab 11/13/2015 0330 10/16/15 0422  NA 139 144  K 3.3* 5.1  CL 106 105  CO2 21* 21*  GLUCOSE 167* 103*  BUN 16 29*  CREATININE 1.70* 2.26*  CALCIUM 9.9 9.3  AST 30 20  ALT 21 14  ALKPHOS 53 23*  BILITOT 1.1 0.6   ------------------------------------------------------------------------------------------------------------------ estimated creatinine clearance is 15.7 mL/min (by C-G formula based on Cr of 2.26). ------------------------------------------------------------------------------------------------------------------ No results for input(s): HGBA1C in the last 72 hours. ------------------------------------------------------------------------------------------------------------------ No results for input(s): CHOL, HDL, LDLCALC, TRIG, CHOLHDL, LDLDIRECT in the last  72 hours. ------------------------------------------------------------------------------------------------------------------ No results for input(s): TSH, T4TOTAL, T3FREE, THYROIDAB in the last 72 hours.  Invalid input(s): FREET3 ------------------------------------------------------------------------------------------------------------------ No results for input(s): VITAMINB12, FOLATE, FERRITIN, TIBC, IRON, RETICCTPCT in the last 72 hours.  Coagulation profile No results for input(s): INR, PROTIME in the last 168 hours.  No results for input(s): DDIMER in the last 72 hours.  Cardiac Enzymes No results for input(s): CKMB, TROPONINI, MYOGLOBIN in the last 168 hours.  Invalid input(s): CK ------------------------------------------------------------------------------------------------------------------ Invalid input(s): POCBNP   CBG: No results for input(s): GLUCAP in the last 168 hours.     Studies: No results found.    No results found for: HGBA1C Lab Results  Component Value Date   LDLCALC 55 04/20/2010   CREATININE 2.26* 10/16/2015       Scheduled Meds: . antiseptic oral rinse  7 mL Mouth Rinse q12n4p  . chlorhexidine  15 mL Mouth Rinse BID  . LORazepam  0.5 mg Intravenous Q4H   Continuous Infusions: . HYDROmorphone 1 mg/hr (10/21/15 0530)     LOS: 6 days    Time spent: >30 MINS    [facility]  [facility] Pager [phone]. If 7PM-7AM, please contact night-coverage at [email or web address], password [email or web address] 10/21/2015, 12:31 PM  LOS: 6 days

## 2015-10-22 MED ORDER — KETOROLAC TROMETHAMINE 30 MG/ML IJ SOLN
30.0000 mg | Freq: Four times a day (QID) | INTRAMUSCULAR | Status: DC | PRN
  Administered 2015-10-22: 30 mg via INTRAVENOUS
  Filled 2015-10-22: qty 1

## 2015-11-14 NOTE — Progress Notes (Signed)
Daily Progress Note   Patient Name: [name]       Date: 11/18/15 DOB: Jul 21, 1919  Age: 82 y.o. MRN#: [id number] Attending Physician: Reyne Dumas, MD Primary Care Physician: Nyoka Cowden, MD Admit Date: 10/30/2015  Reason for Consultation/Follow-up: Non pain symptom management, Pain control, Psychosocial/spiritual support and Terminal Care  Subjective: Patient unresponsive. Actively dying. Daughters and granddaughters in the room  Length of Stay: 7  Current Medications: Scheduled Meds:  . antiseptic oral rinse  7 mL Mouth Rinse q12n4p  . chlorhexidine  15 mL Mouth Rinse BID  . LORazepam  0.5 mg Intravenous Q4H    Continuous Infusions: . HYDROmorphone 1 mg/hr (10/21/15 0530)    PRN Meds: acetaminophen, antiseptic oral rinse, [DISCONTINUED] glycopyrrolate **OR** [DISCONTINUED] glycopyrrolate **OR** glycopyrrolate, [DISCONTINUED] haloperidol **OR** [DISCONTINUED] haloperidol **OR** haloperidol lactate, HYDROmorphone, LORazepam, [DISCONTINUED] ondansetron **OR** ondansetron (ZOFRAN) IV, polyvinyl alcohol  Physical Exam  Constitutional: She appears well-nourished.  HENT:  Head: Normocephalic and atraumatic.  Pulmonary/Chest:  Irrg, shallow  Genitourinary:  foley  Neurological:  unresponsive  Skin:  cool  Psychiatric:  No agitation  Nursing note and vitals reviewed.           Vital Signs: BP 106/40 mmHg  Pulse 109  Temp(Src) 103 F (39.4 C) (Axillary)  Resp 22  Wt 84.6 kg (186 lb 8.2 oz)  SpO2 68% SpO2: SpO2: (!) 68 % O2 Device: O2 Device: Not Delivered O2 Flow Rate: O2 Flow Rate (L/min): 2 L/min  Intake/output summary:  Intake/Output Summary (Last 24 hours) at November 18, 2015 0802 Last data filed at 2015/11/18 0551  Gross per 24 hour  Intake   66.8  ml  Output     25 ml  Net   41.8 ml   LBM: Last BM Date:  (Colostomy) Baseline Weight: Weight: 84.6 kg (186 lb 8.2 oz) Most recent weight: Weight: 84.6 kg (186 lb 8.2 oz)       Palliative Assessment/Data:    Flowsheet Rows        Most Recent Value   Intake Tab    Referral Department  -- [ED]   Unit at Time of Referral  ER   Palliative Care Primary Diagnosis  Cancer   Date Notified  11/11/2015   Palliative Care Type  New Palliative care   Reason for referral  Clarify Goals of  Care   Date of Admission  11/13/2015   Date first seen by Palliative Care  10/16/15   # of days Palliative referral response time  1 Day(s)   # of days IP prior to Palliative referral  0   Clinical Assessment    Palliative Performance Scale Score  10%   Psychosocial & Spiritual Assessment    Palliative Care Outcomes    Patient/Family meeting held?  Yes   Who was at the meeting?  daughter, family      Patient Active Problem List   Diagnosis Date Noted  . Terminal care   . Palliative care encounter   . DNR (do not resuscitate)   . Generalized abdominal pain   . Perforated viscus 10/21/2015  . Pneumomediastinum ([place]) 11/03/2015  . Tachycardia 10/28/2015  . Intestinal perforation ([place]) 10/30/2015  . Hypokalemia 10/25/2015  . Acute renal failure ([place]) 11/05/2015  . SBO (small bowel obstruction) ([place]) 08/14/2014  . Nausea with vomiting 08/14/2014  . Abdominal distention 08/14/2014  . Ventral hernia with bowel obstruction 08/14/2014  . AP (abdominal pain)   . CKD (chronic kidney disease) stage 3, GFR 30-59 ml/min 08/05/2014  . LEG PAIN, LEFT 05/14/2010  . PANCREATITIS, ACUTE 12/27/2007  . RENAL FAILURE, ACUTE 12/27/2007  . DIARRHEA, ACUTE 12/27/2007  . HYPOTENSION, ORTHOSTATIC 12/24/2007  . Dyslipidemia 06/19/2007  . NEPHROLITHIASIS, HX OF 06/19/2007  . Lumbago 03/13/2007  . SYNCOPE 03/13/2007  . KIDNEY STONE 02/04/2007  . DYSURIA 02/04/2007  . Malignant neoplasm of colon ([place]) 02/03/2007  .  Essential hypertension 02/03/2007  . STENOSIS, LUMBAR SPINE 02/03/2007    Palliative Care Assessment & Plan   Patient Profile: 82 y.o. female with past medical history of colostomy placement 16 years ago. She has had a large parastomal hernia for many years requiring 1 admission for blockage of colostomy output. Admitted on 11/09/2015 with abd pain with nausea and dry heaving following return home from family beach trip. Abd CT showed perforated viscous with pneumomediastinum. Patient and family have declined surgery in favor of comfort.   Assessment: Unresponsive. Breathing shallow, irregular; brief periods of apnea. No secretions or agitation. Family prepared for death to be at any time and outwardly coping well  Recommendations/Plan:  Continue current Dilaudid infusion. Chart reviewed. One when necessary in the past 24 hour period.  Goals of Care and Additional Recommendations:  Limitations on Scope of Treatment: Full Comfort Care  Code Status:    Code Status Orders        Start     Ordered   10/16/15 1459  Do not attempt resuscitation (DNR)   Continuous    Question Answer Comment  In the event of cardiac or respiratory ARREST Do not call a "code blue"   In the event of cardiac or respiratory ARREST Do not perform Intubation, CPR, defibrillation or ACLS   In the event of cardiac or respiratory ARREST Use medication by any route, position, wound care, and other measures to relive pain and suffering. May use oxygen, suction and manual treatment of airway obstruction as needed for comfort.      10/16/15 1458    Code Status History    Date Active Date Inactive Code Status Order ID Comments User Context   10/14/2015  8:53 AM 10/16/2015  2:58 PM DNR [id number]  Jani Gravel, MD Inpatient   08/14/2014  3:52 AM 08/18/2014  2:11 PM Full Code [id number]  Theressa Millard, MD Inpatient       Prognosis:   Hours -  Days  Discharge Planning:  Anticipated Hospital Death    Thank you  for allowing the Palliative Medicine Team to assist in the care of this patient.   Time In: 1500 Time Out: 1520 Total Time 20 min Prolonged Time Billed  no       Greater than 50%  of this time was spent counseling and coordinating care related to the above assessment and plan.  Dory Horn, NP  Please contact Palliative Medicine Team phone at [phone] for questions and concerns.

## 2015-11-14 NOTE — Discharge Summary (Signed)
Physician Discharge Summary  [name] MRN: [id number] DOB/AGE: June 03, 1919 82 y.o.  PCP: Nyoka Cowden, MD   Admit date: 11/10/2015 Discharge date: 10/24/2015  Discharge Diagnoses:     Active Problems:   Perforated viscus   Pneumomediastinum ([facility])   Tachycardia   Intestinal perforation ([facility])   Hypokalemia   Acute renal failure ([facility])   Palliative care encounter   DNR (do not resuscitate)   Generalized abdominal pain   Terminal care       3:15 pm is  time of death      Discharge Medication List as of 10-27-15  4:37 PM    CONTINUE these medications which have NOT CHANGED   Details  amLODipine (NORVASC) 5 MG tablet TAKE 1 TABLET BY MOUTH EVERY DAY, Normal    Flaxseed, Linseed, (FLAXSEED OIL) 1000 MG CAPS Take 2,000 mg by mouth daily. , Until Discontinued, Historical Med    metoprolol (LOPRESSOR) 50 MG tablet TAKE 1/2 TABLET BY MOUTH TWICE DAILY, Normal    Multiple Vitamins-Minerals (ONE-A-DAY WOMENS 50 PLUS PO) Take 1 tablet by mouth daily., Until Discontinued, Historical Med    Omega-3 Fatty Acids (FISH OIL) 1200 MG CAPS Take 1 capsule by mouth daily. , Until Discontinued, Historical Med    hydrocortisone cream 1 % Apply topically 2 (two) times daily. Or LOTION, available over the counter. Apply to the rash until heals., Starting 08/18/2014, Until Discontinued, Print    Ostomy Supplies ([place]) [place] 1 each by Does not apply route as needed., Starting 09/06/2011, Until Discontinued, Print           Allergies  Allergen Reactions  . Amoxicillin-Pot Clavulanate     REACTION: hives      Disposition: 20-Expired   Consults:  Palliative care    Significant Diagnostic Studies:  Dg Chest Port 1 View  10/24/2015  CLINICAL DATA:  Recent perforated viscus EXAM: PORTABLE CHEST 1 VIEW COMPARISON:  07/16/2010 FINDINGS: Cardiac shadow is at the upper limits of normal in size. No free air is noted beneath right hemidiaphragm  consistent with the given clinical history. Lungs are well aerated bilaterally. No focal infiltrate or sizable effusion is seen. No bony abnormality is noted. IMPRESSION: Free air consistent with the patient's given clinical history. No acute abnormality noted. Electronically Signed   By: Inez Catalina M.D.   On: 10/19/2015 07:33   Ct Renal Stone Study  10/19/2015  CLINICAL DATA:  Abdominal pain around colostomy.  No al put. EXAM: CT ABDOMEN AND PELVIS WITHOUT CONTRAST TECHNIQUE: Multidetector CT imaging of the abdomen and pelvis was performed following the standard protocol without IV contrast. COMPARISON:  08/13/2014 FINDINGS: There is a large volume of extraluminal air throughout the abdomen, pelvis and lower chest. The air most likely originates from a perforation within the peristomal hernia of the right lower quadrant. A large volume of extraluminal air fills the peristomal hernia and then tracks throughout the extraperitoneal spaces of the abdomen and subcutaneous tissues. It also dissects upward around the liver and into a pneumomediastinum. No significant fluid component to the extraluminal collection. In the lower chest, minimal atelectatic opacities in the posterior lung bases. No pneumothorax or effusion. Gallbladder is moderately distended with several calculi. No focal liver lesion. No bile duct dilatation. Pancreas, spleen and left kidney are unremarkable. Right kidney again demonstrates marked atrophy and chronic right hydroureter continuing to the presumed structure in the right hemipelvis. Abdominal aorta is normal in caliber with moderate atherosclerotic calcification. No significant skeletal lesion.  Severe lumbar degenerative disc and facet changes are again evident. IMPRESSION: 1. Large volume of extraluminal air in the right abdominal peristomal hernia from perforation of the herniated colon. The extraluminal air has dissected throughout the extraperitoneal spaces of the abdomen and pelvis,  also extended across the midline into the subcutaneous anterior soft tissues and dissected upward into a pneumomediastinum of the lower chest. These results were called by telephone at the time of interpretation on 10/25/2015 at 6:48 am to Dr. Tanna Furry , who verbally acknowledged these results. 2. Other findings not significantly changed from 08/13/2014 include cholelithiasis, chronic right ureteral stricture with profound right renal atrophy, extensive aortoiliac atherosclerotic calcification and severe lumbar degenerative changes. Electronically Signed   By: Andreas Newport M.D.   On: 10/27/2015 06:54       Filed Weights   10/16/15 1222  Weight: 84.6 kg (186 lb 8.2 oz)     Microbiology: Recent Results (from the past 240 hour(s))  Urine culture     Status: Abnormal   Collection Time: 10/19/2015  3:36 AM  Result Value Ref Range Status   Specimen Description URINE, RANDOM  Final   Special Requests CX ADDED AT 0555 ON [id number]  Final   Culture MULTIPLE SPECIES PRESENT, SUGGEST RECOLLECTION (A)  Final   Report Status 10/16/2015 FINAL  Final       Blood Culture    Component Value Date/Time   SDES URINE, RANDOM 11/10/2015 0336   SPECREQUEST CX ADDED AT 0555 ON [id number] 10/20/2015 0336   CULT MULTIPLE SPECIES PRESENT, SUGGEST RECOLLECTION* 11/10/2015 0336   REPTSTATUS 10/16/2015 FINAL 10/17/2015 0336      Labs: No results found for this or any previous visit (from the past 48 hour(s)).   Lipid Panel     Component Value Date/Time   CHOL 203* 04/23/2011 0940   TRIG 255.0* 04/23/2011 0940   HDL 52.40 04/23/2011 0940   CHOLHDL 4 04/23/2011 0940   VLDL 51.0* 04/23/2011 0940   LDLCALC 55 04/20/2010 1002   LDLDIRECT 102.8 04/23/2011 0940     No results found for: HGBA1C   Lab Results  Component Value Date   LDLCALC 55 04/20/2010   CREATININE 2.26* 10/16/2015    [name] is a 82 y.o. female, w/ hx of colon cancer w colostomy, apparently c/o abdominal pain starting  last nite per family. Pt had dry heaves. Pt also noted to have decrease in ostomy output per family. Pt denies fever, chills, constipation, blood per ostomy. Pt brought to ED by family for evaluation.   In Ed. CT scan => perforated viscous along with pneumomediastinum Surgery was consulted by ED and pt and family opted not to pursue surgery due to age and comorbidities. Palliative care has been consulted. Patient and family are aware of the poor prognosis and patient has decided on DNR. And family is amenable to this. Pt will be treated with iv abx, and admitted for perforated viscous and pneumomediastinum  Assessment and plan   1. Perforated viscous , pt declined surgery Initially started on (levaquin, flagyl due to pcn allergy), discontinued secondary to full comfort care measures Continue Robinul, Dilaudid, lorazepam, dosage is being titrated by palliative care Found to have agonal respirations but vitals are becoming unstable febrile and tachycardic  She was anticipated to have hospital death   2. Pneumomediastinum Iv abx discontinued Surgery has signed off Breathing shallow, irregular; brief periods of apnea , nearing death  3. ARF IV fluids stopped Avoid nephrotoxins, no further labs  4.  Hypokalemia , no further labs   5. Poor prognosis Appreciate palliative care recommendations   6. Tachycardia Patient became more tachycardic due to fever prior to her death.  7. Hypertension Discontinued metoprolol, Norvasc Started on Dilaudid drip for comfort 7/4    Discharge Exam:   Blood pressure 106/40, pulse 109, temperature 102.8 F (39.3 C), temperature source Axillary, resp. rate 22, weight 84.6 kg (186 lb 8.2 oz), SpO2 68 %.        SignedReyne Dumas 10/24/2015, 10:12 AM        Time spent >45 mins

## 2015-11-14 NOTE — Progress Notes (Signed)
Daily Progress Note   Patient Name: [name]       Date: 2015/11/16 DOB: 1919/11/25  Age: 82 y.o. MRN#: [id number] Attending Physician: Reyne Dumas, MD Primary Care Physician: Nyoka Cowden, MD Admit Date: 11/02/2015  Reason for Consultation/Follow-up: Non pain symptom management, Pain control and Terminal Care  Subjective: Patient is unresponsive. She is actively dying. Nailbeds cyanotic, mottling noted to hands. She has been febrile for the past 2 days; temperature this morning 103.3. No secretions. Chart reviewed only one bolus given in 24 hour period. No agitation. Controlled well with scheduled Ativan  Length of Stay: 7  Current Medications: Scheduled Meds:  . antiseptic oral rinse  7 mL Mouth Rinse q12n4p  . chlorhexidine  15 mL Mouth Rinse BID  . LORazepam  0.5 mg Intravenous Q4H    Continuous Infusions: . HYDROmorphone 1 mg/hr (10/21/15 0530)    PRN Meds: acetaminophen, antiseptic oral rinse, [DISCONTINUED] glycopyrrolate **OR** [DISCONTINUED] glycopyrrolate **OR** glycopyrrolate, [DISCONTINUED] haloperidol **OR** [DISCONTINUED] haloperidol **OR** haloperidol lactate, HYDROmorphone, ketorolac, LORazepam, [DISCONTINUED] ondansetron **OR** ondansetron (ZOFRAN) IV, polyvinyl alcohol  Physical Exam          Vital Signs: BP 106/40 mmHg  Pulse 109  Temp(Src) 102.8 F (39.3 C) (Axillary)  Resp 22  Wt 84.6 kg (186 lb 8.2 oz)  SpO2 68% SpO2: SpO2: (!) 68 % O2 Device: O2 Device: Not Delivered O2 Flow Rate: O2 Flow Rate (L/min): 2 L/min  Intake/output summary:  Intake/Output Summary (Last 24 hours) at Nov 16, 2015 1155 Last data filed at 2015/11/16 0551  Gross per 24 hour  Intake   66.8 ml  Output     25 ml  Net   41.8 ml   LBM: Last BM Date:   (Colostomy) Baseline Weight: Weight: 84.6 kg (186 lb 8.2 oz) Most recent weight: Weight: 84.6 kg (186 lb 8.2 oz)       Palliative Assessment/Data:    Flowsheet Rows        Most Recent Value   Intake Tab    Referral Department  -- [ED]   Unit at Time of Referral  ER   Palliative Care Primary Diagnosis  Cancer   Date Notified  11/03/2015   Palliative Care Type  New Palliative care   Reason for referral  Clarify Goals of Care   Date of Admission  10/20/2015   Date first seen by Palliative Care  10/16/15   # of days Palliative referral response time  1 Day(s)   # of days IP prior to Palliative referral  0   Clinical Assessment    Palliative Performance Scale Score  10%   Psychosocial & Spiritual Assessment    Palliative Care Outcomes    Patient/Family meeting held?  Yes   Who was at the meeting?  daughter, family      Patient Active Problem List   Diagnosis Date Noted  . Terminal care   . Palliative care encounter   . DNR (do not resuscitate)   . Generalized abdominal pain   . Perforated viscus 11/04/2015  . Pneumomediastinum ([place]) 10/14/2015  . Tachycardia 11/04/2015  . Intestinal perforation ([place]) 11/08/2015  . Hypokalemia 10/20/2015  . Acute renal failure ([place]) 11/01/2015  . SBO (small bowel obstruction) ([place]) 08/14/2014  . Nausea with vomiting 08/14/2014  . Abdominal distention 08/14/2014  . Ventral hernia with bowel obstruction 08/14/2014  . AP (abdominal pain)   . CKD (chronic kidney disease) stage 3, GFR 30-59 ml/min 08/05/2014  . LEG PAIN, LEFT 05/14/2010  . PANCREATITIS, ACUTE 12/27/2007  . RENAL FAILURE, ACUTE 12/27/2007  . DIARRHEA, ACUTE 12/27/2007  . HYPOTENSION, ORTHOSTATIC 12/24/2007  . Dyslipidemia 06/19/2007  . NEPHROLITHIASIS, HX OF 06/19/2007  . Lumbago 03/13/2007  . SYNCOPE 03/13/2007  . KIDNEY STONE 02/04/2007  . DYSURIA 02/04/2007  . Malignant neoplasm of colon ([place]) 02/03/2007  . Essential hypertension 02/03/2007  . STENOSIS, LUMBAR SPINE  02/03/2007    Palliative Care Assessment & Plan   Patient Profile: [name] is a 82 y.o. female, w/ hx of colon cancer w colostomy, apparently c/o abdominal pain starting last nite per family. Pt had dry heaves. Pt also noted to have decrease in ostomy output per family. Pt denies fever, chills, constipation, blood per ostomy. Pt brought to ED by family for evaluation.   In Ed. CT scan => perforated viscous along with pneumomediastinum Surgery was consulted by ED and pt and family opted not to pursue surgery due to age and comorbidities. Palliative care has been consulted. Patient and family are aware of the poor prognosis and patient has decided on DNR. And family is amenable to this. Pt will be treated with iv abx, and admitted for perforated viscous and pneumomediastinum  Assessment: Patient unresponsive. Actively dying; daughters remain at the bedside.  Recommendations/Plan:  Pain: Continue Dilaudid continuous infusion, basal and bolus rate unchanged  Dyspnea: Primary management with opioids via Dilaudid continuous infusion   Secretions: Robinul available as needed  Agitation: Continue with scheduled as well as as needed Ativan  Fever: We'll add Toradol IV 30 mg every 6 hours as needed. Family has been reluctant to turn patient    Goals of Care and Additional Recommendations:  Limitations on Scope of Treatment: Full Comfort Care  Code Status:    Code Status Orders        Start     Ordered   10/16/15 1459  Do not attempt resuscitation (DNR)   Continuous    Question Answer Comment  In the event of cardiac or respiratory ARREST Do not call a "code blue"   In the event of cardiac or respiratory ARREST Do not perform Intubation, CPR, defibrillation or ACLS   In the event of cardiac or respiratory ARREST Use medication by any route, position, wound care, and other measures to relive pain and suffering. May use oxygen, suction and manual treatment  of airway  obstruction as needed for comfort.      10/16/15 1458    Code Status History    Date Active Date Inactive Code Status Order ID Comments User Context   11/09/2015  8:53 AM 10/16/2015  2:58 PM DNR [id number]  Jani Gravel, MD Inpatient   08/14/2014  3:52 AM 08/18/2014  2:11 PM Full Code [id number]  Theressa Millard, MD Inpatient       Prognosis:   Hours - Days  Discharge Planning:  Anticipated Hospital Death   Thank you for allowing the Palliative Medicine Team to assist in the care of this patient.   Time In: 0900 Time Out: 0925 Total Time 25 min Prolonged Time Billed  no       Greater than 50%  of this time was spent counseling and coordinating care related to the above assessment and plan.  Dory Horn, NP  Please contact Palliative Medicine Team phone at [phone] for questions and concerns.

## 2015-11-14 NOTE — Progress Notes (Signed)
[facility] PROGRESS NOTE  [name] [id number] DOB: 08/29/1919 DOA: 11/12/2015   PCP: Nyoka Cowden, MD     Assessment/Plan: Active Problems:   Perforated viscus   Pneumomediastinum ([place])   Tachycardia   Intestinal perforation ([place])   Hypokalemia   Acute renal failure ([place])   Palliative care encounter   DNR (do not resuscitate)   Generalized abdominal pain   Terminal care   [name] is a 82 y.o. female, w/ hx of colon cancer w colostomy, apparently c/o abdominal pain starting last nite per family. Pt had dry heaves. Pt also noted to have decrease in ostomy output per family. Pt denies fever, chills, constipation, blood per ostomy. Pt brought to ED by family for evaluation.   In Ed. CT scan => perforated viscous along with pneumomediastinum Surgery was consulted by ED and pt and family opted not to pursue surgery due to age and comorbidities. Palliative care has been consulted. Patient and family are aware of the poor prognosis and patient has decided on DNR. And family is amenable to this. Pt will be treated with iv abx, and admitted for perforated viscous and pneumomediastinum  Assessment and plan   1. Perforated viscous , pt declined surgery Initially started on (levaquin, flagyl due to pcn allergy), discontinued secondary to full comfort care measures Continue Robinul, Dilaudid, lorazepam, dosage is being titrated by palliative care Found to have agonal respirations but vitals are becoming unstable febrile and tachycardic    2. Pneumomediastinum Iv abx discontinued Surgery has signed off  Breathing shallow, irregular; brief periods of apnea  3. ARF IV fluids stopped Avoid nephrotoxins, no further labs  4. Hypokalemia , no further labs   5. Poor prognosis Appreciate palliative care consultation requested  6. Tachycardia Due to pain and possible early sepsis Will continue to monitor  7.  Hypertension Discontinued metoprolol, Norvasc Started on Dilaudid drip for comfort 7/4   DVT prophylaxsis SCDs  Code Status:  DO NOT RESUSCITATE        Family Communication: Discussed in detail with the patient's family in the room, all imaging results, lab results explained to the patient   Disposition Plan:  Continue comfort care measures, anticipate hospital death from sepsis     Consultants:  Palliative care  General surgery    Procedures:  None     HPI/Subjective: Patient unresponsive. Actively dying. Daughters and granddaughters in the room  Objective: Filed Vitals:   10/19/15 0609 10/20/15 0610 10/21/15 0543 Nov 09, 2015 0549  BP: 156/82 162/82 133/86 106/40  Pulse: 106 114 116 109  Temp: 98.9 F (37.2 C) 99.8 F (37.7 C) 101.1 F (38.4 C) 103 F (39.4 C)  TempSrc: Oral Oral Axillary Axillary  Resp: 12   22  Weight:      SpO2: 76% 89% 77% 68%    Intake/Output Summary (Last 24 hours) at November 09, 2015 1128 Last data filed at Nov 09, 2015 0551  Gross per 24 hour  Intake   66.8 ml  Output     25 ml  Net   41.8 ml    Exam:  Examination:  General exam: Appears calm and comfortable , Somnolent Respiratory system: Clear to auscultation. Respiratory effort normal. Cardiovascular system: S1 & S2 heard, RRR. No JVD, murmurs, rubs, gallops or clicks. No pedal edema. Gastrointestinal system: Abdomen is nondistended, soft and nontender. No organomegaly or masses felt. Normal bowel sounds heard. Central nervous system: Alert and oriented. No focal neurological deficits. Extremities: Symmetric 5 x 5  power. Skin: No rashes, lesions or ulcers     Data Reviewed: I have personally reviewed following labs and imaging studies  Micro Results Recent Results (from the past 240 hour(s))  Urine culture     Status: Abnormal   Collection Time: 11/03/2015  3:36 AM  Result Value Ref Range Status   Specimen Description URINE, RANDOM  Final   Special Requests CX ADDED AT  [place] ON [id number]  Final   Culture MULTIPLE SPECIES PRESENT, SUGGEST RECOLLECTION (A)  Final   Report Status 10/16/2015 FINAL  Final    Radiology Reports Dg Chest Port 1 View  11/07/2015  CLINICAL DATA:  Recent perforated viscus EXAM: PORTABLE CHEST 1 VIEW COMPARISON:  07/16/2010 FINDINGS: Cardiac shadow is at the upper limits of normal in size. No free air is noted beneath right hemidiaphragm consistent with the given clinical history. Lungs are well aerated bilaterally. No focal infiltrate or sizable effusion is seen. No bony abnormality is noted. IMPRESSION: Free air consistent with the patient's given clinical history. No acute abnormality noted. Electronically Signed   By: Inez Catalina M.D.   On: 10/20/2015 07:33   Ct Renal Stone Study  10/14/2015  CLINICAL DATA:  Abdominal pain around colostomy.  No al put. EXAM: CT ABDOMEN AND PELVIS WITHOUT CONTRAST TECHNIQUE: Multidetector CT imaging of the abdomen and pelvis was performed following the standard protocol without IV contrast. COMPARISON:  08/13/2014 FINDINGS: There is a large volume of extraluminal air throughout the abdomen, pelvis and lower chest. The air most likely originates from a perforation within the peristomal hernia of the right lower quadrant. A large volume of extraluminal air fills the peristomal hernia and then tracks throughout the extraperitoneal spaces of the abdomen and subcutaneous tissues. It also dissects upward around the liver and into a pneumomediastinum. No significant fluid component to the extraluminal collection. In the lower chest, minimal atelectatic opacities in the posterior lung bases. No pneumothorax or effusion. Gallbladder is moderately distended with several calculi. No focal liver lesion. No bile duct dilatation. Pancreas, spleen and left kidney are unremarkable. Right kidney again demonstrates marked atrophy and chronic right hydroureter continuing to the presumed structure in the right hemipelvis. Abdominal aorta  is normal in caliber with moderate atherosclerotic calcification. No significant skeletal lesion. Severe lumbar degenerative disc and facet changes are again evident. IMPRESSION: 1. Large volume of extraluminal air in the right abdominal peristomal hernia from perforation of the herniated colon. The extraluminal air has dissected throughout the extraperitoneal spaces of the abdomen and pelvis, also extended across the midline into the subcutaneous anterior soft tissues and dissected upward into a pneumomediastinum of the lower chest. These results were called by telephone at the time of interpretation on 11/13/2015 at 6:48 am to Dr. Tanna Furry , who verbally acknowledged these results. 2. Other findings not significantly changed from 08/13/2014 include cholelithiasis, chronic right ureteral stricture with profound right renal atrophy, extensive aortoiliac atherosclerotic calcification and severe lumbar degenerative changes. Electronically Signed   By: Andreas Newport M.D.   On: 10/28/2015 06:54     CBC  Recent Labs Lab 10/16/15 0422  WBC 10.5  HGB 12.1  HCT 39.3  PLT 127*  MCV 98.0  MCH 30.2  MCHC 30.8  RDW 14.0    Chemistries   Recent Labs Lab 10/16/15 0422  NA 144  K 5.1  CL 105  CO2 21*  GLUCOSE 103*  BUN 29*  CREATININE 2.26*  CALCIUM 9.3  AST 20  ALT 14  ALKPHOS 23*  BILITOT 0.6   ------------------------------------------------------------------------------------------------------------------ estimated creatinine clearance is 15.7 mL/min (by C-G formula based on Cr of 2.26). ------------------------------------------------------------------------------------------------------------------ No results for input(s): HGBA1C in the last 72 hours. ------------------------------------------------------------------------------------------------------------------ No results for input(s): CHOL, HDL, LDLCALC, TRIG, CHOLHDL, LDLDIRECT in the last 72  hours. ------------------------------------------------------------------------------------------------------------------ No results for input(s): TSH, T4TOTAL, T3FREE, THYROIDAB in the last 72 hours.  Invalid input(s): FREET3 ------------------------------------------------------------------------------------------------------------------ No results for input(s): VITAMINB12, FOLATE, FERRITIN, TIBC, IRON, RETICCTPCT in the last 72 hours.  Coagulation profile No results for input(s): INR, PROTIME in the last 168 hours.  No results for input(s): DDIMER in the last 72 hours.  Cardiac Enzymes No results for input(s): CKMB, TROPONINI, MYOGLOBIN in the last 168 hours.  Invalid input(s): CK ------------------------------------------------------------------------------------------------------------------ Invalid input(s): POCBNP   CBG: No results for input(s): GLUCAP in the last 168 hours.     Studies: No results found.    No results found for: HGBA1C Lab Results  Component Value Date   LDLCALC 55 04/20/2010   CREATININE 2.26* 10/16/2015       Scheduled Meds: . antiseptic oral rinse  7 mL Mouth Rinse q12n4p  . chlorhexidine  15 mL Mouth Rinse BID  . LORazepam  0.5 mg Intravenous Q4H   Continuous Infusions: . HYDROmorphone 1 mg/hr (10/21/15 0530)     LOS: 7 days    Time spent: >30 MINS    [facility]  [facility] Pager [phone]. If 7PM-7AM, please contact night-coverage at [email or web address], password [facility] Nov 05, 2015, 11:28 AM  LOS: 7 days

## 2015-11-14 NOTE — Progress Notes (Addendum)
Passed at this time 3:15 pm, 2nd RN Krisite RN vertified death at this time, family at bedside, [place] donor notified [phone],  Chaplain notified, Dr. Allyson Sabal notified

## 2015-11-14 NOTE — Progress Notes (Signed)
Dilaudid IV drip wasted 31ml at this time. Hassan Rowan RN witnessed

## 2015-11-14 DEATH — deceased

## 2016-01-24 ENCOUNTER — Ambulatory Visit: Payer: Medicare Other | Admitting: Internal Medicine

## 2016-01-29 ENCOUNTER — Ambulatory Visit: Payer: Medicare Other | Admitting: Internal Medicine
